# Patient Record
Sex: Female | Born: 1935 | Race: White | Hispanic: No | Marital: Single | Smoking: Never smoker
Health system: Southern US, Community
[De-identification: ages and names within clinical notes are randomized; demographics above are authoritative.]

## PROBLEM LIST (undated history)

## (undated) DIAGNOSIS — K648 Other hemorrhoids: Secondary | ICD-10-CM

## (undated) DIAGNOSIS — T7840XA Allergy, unspecified, initial encounter: Secondary | ICD-10-CM

## (undated) DIAGNOSIS — M8080XA Other osteoporosis with current pathological fracture, unspecified site, initial encounter for fracture: Secondary | ICD-10-CM

## (undated) DIAGNOSIS — S72002A Fracture of unspecified part of neck of left femur, initial encounter for closed fracture: Secondary | ICD-10-CM

## (undated) DIAGNOSIS — K5790 Diverticulosis of intestine, part unspecified, without perforation or abscess without bleeding: Secondary | ICD-10-CM

## (undated) DIAGNOSIS — Z96659 Presence of unspecified artificial knee joint: Secondary | ICD-10-CM

## (undated) DIAGNOSIS — D649 Anemia, unspecified: Secondary | ICD-10-CM

## (undated) DIAGNOSIS — B351 Tinea unguium: Secondary | ICD-10-CM

## (undated) DIAGNOSIS — I10 Essential (primary) hypertension: Secondary | ICD-10-CM

## (undated) DIAGNOSIS — R21 Rash and other nonspecific skin eruption: Secondary | ICD-10-CM

## (undated) HISTORY — DX: Other hemorrhoids: K64.8

## (undated) HISTORY — DX: Diverticulosis of intestine, part unspecified, without perforation or abscess without bleeding: K57.90

## (undated) HISTORY — DX: Other osteoporosis with current pathological fracture, unspecified site, initial encounter for fracture: M80.80XA

## (undated) HISTORY — PX: CATARACT EXTRACTION: SUR2

## (undated) HISTORY — DX: Rash and other nonspecific skin eruption: R21

## (undated) HISTORY — PX: REPLACEMENT TOTAL KNEE: SUR1224

## (undated) HISTORY — DX: Essential (primary) hypertension: I10

## (undated) HISTORY — DX: Fracture of unspecified part of neck of left femur, initial encounter for closed fracture: S72.002A

## (undated) HISTORY — DX: Tinea unguium: B35.1

## (undated) HISTORY — DX: Presence of unspecified artificial knee joint: Z96.659

## (undated) HISTORY — PX: TONSILLECTOMY: SHX5217

## (undated) HISTORY — DX: Anemia, unspecified: D64.9

## (undated) HISTORY — DX: Allergy, unspecified, initial encounter: T78.40XA

---

## 2001-05-08 ENCOUNTER — Encounter: Payer: Self-pay | Admitting: Internal Medicine

## 2002-03-10 ENCOUNTER — Other Ambulatory Visit: Admission: RE | Admit: 2002-03-10 | Discharge: 2002-03-10 | Payer: Self-pay | Admitting: Internal Medicine

## 2003-10-30 ENCOUNTER — Encounter: Payer: Self-pay | Admitting: Internal Medicine

## 2004-03-08 ENCOUNTER — Ambulatory Visit: Payer: Self-pay | Admitting: Internal Medicine

## 2004-03-17 ENCOUNTER — Ambulatory Visit: Payer: Self-pay | Admitting: Internal Medicine

## 2004-06-08 ENCOUNTER — Ambulatory Visit: Payer: Self-pay | Admitting: Internal Medicine

## 2004-11-17 ENCOUNTER — Ambulatory Visit: Payer: Self-pay | Admitting: Internal Medicine

## 2004-12-06 ENCOUNTER — Ambulatory Visit: Payer: Self-pay | Admitting: Internal Medicine

## 2005-03-13 ENCOUNTER — Ambulatory Visit: Payer: Self-pay | Admitting: Internal Medicine

## 2005-03-31 ENCOUNTER — Ambulatory Visit: Payer: Self-pay | Admitting: Internal Medicine

## 2005-09-11 ENCOUNTER — Ambulatory Visit: Payer: Self-pay | Admitting: Internal Medicine

## 2005-10-17 ENCOUNTER — Ambulatory Visit: Payer: Self-pay | Admitting: Internal Medicine

## 2005-12-18 ENCOUNTER — Ambulatory Visit: Payer: Self-pay | Admitting: Internal Medicine

## 2006-02-22 ENCOUNTER — Ambulatory Visit: Payer: Self-pay | Admitting: Internal Medicine

## 2006-02-27 ENCOUNTER — Ambulatory Visit: Payer: Self-pay | Admitting: Internal Medicine

## 2006-03-15 ENCOUNTER — Ambulatory Visit: Payer: Self-pay | Admitting: Internal Medicine

## 2006-05-15 ENCOUNTER — Ambulatory Visit: Payer: Self-pay | Admitting: Internal Medicine

## 2006-06-15 ENCOUNTER — Ambulatory Visit: Payer: Self-pay | Admitting: Internal Medicine

## 2006-06-15 LAB — CONVERTED CEMR LAB
CO2: 29 meq/L (ref 19–32)
GFR calc Af Amer: 89 mL/min
GFR calc non Af Amer: 74 mL/min
Glucose, Bld: 90 mg/dL (ref 70–99)
HDL: 58.5 mg/dL (ref >39.0)
Potassium: 4.7 meq/L (ref 3.5–5.1)
Sodium: 145 meq/L (ref 135–145)
Total CHOL/HDL Ratio: 3.5
Triglycerides: 110 mg/dL (ref 0–149)

## 2006-08-22 ENCOUNTER — Ambulatory Visit: Payer: Self-pay | Admitting: Internal Medicine

## 2006-09-03 ENCOUNTER — Inpatient Hospital Stay (HOSPITAL_COMMUNITY): Admission: RE | Admit: 2006-09-03 | Discharge: 2006-09-07 | Payer: Self-pay | Admitting: Orthopedic Surgery

## 2006-10-09 ENCOUNTER — Ambulatory Visit: Payer: Self-pay | Admitting: Internal Medicine

## 2006-11-07 ENCOUNTER — Ambulatory Visit: Payer: Self-pay | Admitting: Internal Medicine

## 2006-11-07 LAB — CONVERTED CEMR LAB
Basophils Absolute: 0.1 10*3/uL (ref 0.0–0.1)
Basophils Relative: 1.1 % — ABNORMAL HIGH (ref 0.0–1.0)
Eosinophils Absolute: 0.1 10*3/uL (ref 0.0–0.6)
Eosinophils Relative: 2 % (ref 0.0–5.0)
HCT: 33.1 % — ABNORMAL LOW (ref 36.0–46.0)
Hemoglobin: 11.2 g/dL — ABNORMAL LOW (ref 12.0–15.0)
Iron: 75 ug/dL (ref 42–145)
Lymphocytes Relative: 20.6 % (ref 12.0–46.0)
MCHC: 34 g/dL (ref 30.0–36.0)
MCV: 92.9 fL (ref 78.0–100.0)
Monocytes Absolute: 0.5 10*3/uL (ref 0.2–0.7)
Monocytes Relative: 11.3 % — ABNORMAL HIGH (ref 3.0–11.0)
Neutro Abs: 3.1 10*3/uL (ref 1.4–7.7)
Neutrophils Relative %: 65 % (ref 43.0–77.0)
Platelets: 204 10*3/uL (ref 150–400)
RBC: 3.56 M/uL — ABNORMAL LOW (ref 3.87–5.11)
RDW: 13.5 % (ref 11.5–14.6)
Saturation Ratios: 24 % (ref 20.0–50.0)
Transferrin: 223.1 mg/dL (ref >=212.0)
WBC: 4.8 10*3/uL (ref 4.5–10.5)

## 2006-12-17 ENCOUNTER — Encounter: Payer: Self-pay | Admitting: Internal Medicine

## 2006-12-17 DIAGNOSIS — I10 Essential (primary) hypertension: Secondary | ICD-10-CM | POA: Insufficient documentation

## 2006-12-17 DIAGNOSIS — B372 Candidiasis of skin and nail: Secondary | ICD-10-CM

## 2006-12-17 DIAGNOSIS — J309 Allergic rhinitis, unspecified: Secondary | ICD-10-CM

## 2006-12-17 DIAGNOSIS — M810 Age-related osteoporosis without current pathological fracture: Secondary | ICD-10-CM | POA: Insufficient documentation

## 2006-12-17 DIAGNOSIS — D509 Iron deficiency anemia, unspecified: Secondary | ICD-10-CM

## 2006-12-17 HISTORY — DX: Essential (primary) hypertension: I10

## 2006-12-19 ENCOUNTER — Telehealth (INDEPENDENT_AMBULATORY_CARE_PROVIDER_SITE_OTHER): Payer: Self-pay | Admitting: *Deleted

## 2007-01-11 ENCOUNTER — Ambulatory Visit: Payer: Self-pay | Admitting: Internal Medicine

## 2007-01-11 LAB — CONVERTED CEMR LAB
Basophils Absolute: 0.1 10*3/uL (ref 0.0–0.1)
Hemoglobin: 12.3 g/dL (ref 12.0–15.0)
Iron: 57 ug/dL (ref 42–145)
Lymphocytes Relative: 22.9 % (ref 12.0–46.0)
MCHC: 34.1 g/dL (ref 30.0–36.0)
Monocytes Absolute: 0.8 10*3/uL — ABNORMAL HIGH (ref 0.2–0.7)
Monocytes Relative: 13.6 % — ABNORMAL HIGH (ref 3.0–11.0)
Neutro Abs: 3.7 10*3/uL (ref 1.4–7.7)

## 2007-01-24 ENCOUNTER — Telehealth: Payer: Self-pay | Admitting: Internal Medicine

## 2007-01-24 ENCOUNTER — Ambulatory Visit: Payer: Self-pay | Admitting: Internal Medicine

## 2007-01-24 DIAGNOSIS — Z888 Allergy status to other drugs, medicaments and biological substances status: Secondary | ICD-10-CM | POA: Insufficient documentation

## 2007-02-13 ENCOUNTER — Telehealth (INDEPENDENT_AMBULATORY_CARE_PROVIDER_SITE_OTHER): Payer: Self-pay | Admitting: *Deleted

## 2007-03-08 ENCOUNTER — Ambulatory Visit: Payer: Self-pay | Admitting: Internal Medicine

## 2007-03-22 ENCOUNTER — Ambulatory Visit: Payer: Self-pay | Admitting: Internal Medicine

## 2007-06-10 ENCOUNTER — Ambulatory Visit: Payer: Self-pay | Admitting: Internal Medicine

## 2007-06-10 DIAGNOSIS — Z96659 Presence of unspecified artificial knee joint: Secondary | ICD-10-CM | POA: Insufficient documentation

## 2007-06-10 HISTORY — DX: Presence of unspecified artificial knee joint: Z96.659

## 2007-06-10 LAB — CONVERTED CEMR LAB
Basophils Absolute: 0 10*3/uL (ref 0.0–0.1)
Calcium: 9.6 mg/dL (ref 8.4–10.5)
Chloride: 103 meq/L (ref 96–112)
Eosinophils Absolute: 0.1 10*3/uL (ref 0.0–0.6)
GFR calc non Af Amer: 73 mL/min
MCHC: 32.7 g/dL (ref 30.0–36.0)
MCV: 99.4 fL (ref 78.0–100.0)
Platelets: 206 10*3/uL (ref 150–400)
RBC: 3.8 M/uL — ABNORMAL LOW (ref 3.87–5.11)
Sodium: 141 meq/L (ref 135–145)

## 2007-09-09 ENCOUNTER — Ambulatory Visit: Payer: Self-pay | Admitting: Internal Medicine

## 2007-09-09 LAB — CONVERTED CEMR LAB
CO2: 31 meq/L (ref 19–32)
Chloride: 103 meq/L (ref 96–112)
Creatinine, Ser: 0.8 mg/dL (ref 0.4–1.2)
Eosinophils Relative: 1.9 % (ref 0.0–5.0)
Folate: >20 ng/mL
HCT: 35.7 % — ABNORMAL LOW (ref 36.0–46.0)
Hemoglobin: 12.3 g/dL (ref 12.0–15.0)
Iron: 95 ug/dL (ref 42–145)
Lymphocytes Relative: 18.3 % (ref 12.0–46.0)
Monocytes Absolute: 0.5 10*3/uL (ref 0.1–1.0)
Monocytes Relative: 10.7 % (ref 3.0–12.0)
Platelets: 192 10*3/uL (ref 150–400)
Potassium: 4.3 meq/L (ref 3.5–5.1)
WBC: 5 10*3/uL (ref 4.5–10.5)

## 2007-10-24 ENCOUNTER — Telehealth (INDEPENDENT_AMBULATORY_CARE_PROVIDER_SITE_OTHER): Payer: Self-pay | Admitting: *Deleted

## 2007-12-06 ENCOUNTER — Telehealth: Payer: Self-pay | Admitting: Internal Medicine

## 2008-01-07 ENCOUNTER — Ambulatory Visit: Payer: Self-pay | Admitting: Internal Medicine

## 2008-01-27 ENCOUNTER — Encounter: Payer: Self-pay | Admitting: Internal Medicine

## 2008-02-07 ENCOUNTER — Ambulatory Visit: Payer: Self-pay | Admitting: Internal Medicine

## 2008-05-11 ENCOUNTER — Ambulatory Visit: Payer: Self-pay | Admitting: Internal Medicine

## 2008-05-11 LAB — CONVERTED CEMR LAB
ALT: 18 units/L (ref 0–35)
AST: 23 units/L (ref 0–37)
Albumin: 3.8 g/dL (ref 3.5–5.2)
Cholesterol: 198 mg/dL (ref 0–200)
HDL: 52.2 mg/dL (ref >39.0)
Total Bilirubin: 0.6 mg/dL (ref 0.3–1.2)
Total Protein: 6.5 g/dL (ref 6.0–8.3)
Triglycerides: 130 mg/dL (ref 0–149)
VLDL: 26 mg/dL (ref 0–40)

## 2008-05-19 ENCOUNTER — Ambulatory Visit: Payer: Self-pay | Admitting: Internal Medicine

## 2008-05-19 DIAGNOSIS — L272 Dermatitis due to ingested food: Secondary | ICD-10-CM

## 2008-05-19 LAB — CONVERTED CEMR LAB
Cholesterol, target level: 200 mg/dL
LDL Goal: 130 mg/dL

## 2008-05-22 ENCOUNTER — Ambulatory Visit: Payer: Self-pay | Admitting: Internal Medicine

## 2008-08-19 ENCOUNTER — Ambulatory Visit: Payer: Self-pay | Admitting: Internal Medicine

## 2008-08-19 LAB — CONVERTED CEMR LAB
BUN: 17 mg/dL (ref 6–23)
Basophils Absolute: 0.1 10*3/uL (ref 0.0–0.1)
Creatinine, Ser: 0.7 mg/dL (ref 0.4–1.2)
Eosinophils Relative: 3.1 % (ref 0.0–5.0)
GFR calc non Af Amer: 85.25 mL/min (ref >60)
Glucose, Bld: 94 mg/dL (ref 70–99)
Iron: 64 ug/dL (ref 42–145)
Monocytes Absolute: 0.6 10*3/uL (ref 0.1–1.0)
Monocytes Relative: 11.4 % (ref 3.0–12.0)
Neutrophils Relative %: 62.5 % (ref 43.0–77.0)
Platelets: 168 10*3/uL (ref 150.0–400.0)
RDW: 11.5 % (ref 11.5–14.6)
Transferrin: 212.1 mg/dL (ref 212.0–360.0)
Vit D, 25-Hydroxy: 36 ng/mL (ref 30–89)
WBC: 5.4 10*3/uL (ref 4.5–10.5)

## 2008-08-20 ENCOUNTER — Telehealth: Payer: Self-pay | Admitting: *Deleted

## 2008-11-20 ENCOUNTER — Ambulatory Visit: Payer: Self-pay | Admitting: Internal Medicine

## 2009-01-20 LAB — HM MAMMOGRAPHY

## 2009-01-27 ENCOUNTER — Encounter: Payer: Self-pay | Admitting: Internal Medicine

## 2009-02-02 ENCOUNTER — Ambulatory Visit: Payer: Self-pay | Admitting: Internal Medicine

## 2009-05-17 ENCOUNTER — Ambulatory Visit: Payer: Self-pay | Admitting: Internal Medicine

## 2009-05-17 LAB — CONVERTED CEMR LAB
Albumin: 3.5 g/dL (ref 3.5–5.2)
Alkaline Phosphatase: 49 units/L (ref 39–117)
Basophils Absolute: 0 10*3/uL (ref 0.0–0.1)
CO2: 29 meq/L (ref 19–32)
Calcium: 9.1 mg/dL (ref 8.4–10.5)
Creatinine, Ser: 0.8 mg/dL (ref 0.4–1.2)
Eosinophils Absolute: 0.1 10*3/uL (ref 0.0–0.7)
GFR calc non Af Amer: 72.94 mL/min (ref >60)
Glucose, Bld: 78 mg/dL (ref 70–99)
HCT: 35.6 % — ABNORMAL LOW (ref 36.0–46.0)
HDL: 46.7 mg/dL (ref >39.00)
Hemoglobin: 11.7 g/dL — ABNORMAL LOW (ref 12.0–15.0)
Lymphs Abs: 0.9 10*3/uL (ref 0.7–4.0)
MCHC: 32.8 g/dL (ref 30.0–36.0)
MCV: 100.2 fL — ABNORMAL HIGH (ref 78.0–100.0)
Monocytes Absolute: 0.6 10*3/uL (ref 0.1–1.0)
Neutro Abs: 3.7 10*3/uL (ref 1.4–7.7)
Platelets: 180 10*3/uL (ref 150.0–400.0)
RDW: 11.9 % (ref 11.5–14.6)
Sodium: 142 meq/L (ref 135–145)
TSH: 1.11 microintl units/mL (ref 0.35–5.50)
Total Protein: 6.5 g/dL (ref 6.0–8.3)
Triglycerides: 144 mg/dL (ref 0.0–149.0)
VLDL: 28.8 mg/dL (ref 0.0–40.0)

## 2009-05-24 ENCOUNTER — Ambulatory Visit: Payer: Self-pay | Admitting: Internal Medicine

## 2009-11-05 ENCOUNTER — Encounter: Payer: Self-pay | Admitting: Internal Medicine

## 2009-11-05 ENCOUNTER — Ambulatory Visit: Payer: Self-pay | Admitting: Internal Medicine

## 2009-11-15 ENCOUNTER — Ambulatory Visit: Payer: Self-pay | Admitting: Internal Medicine

## 2009-11-15 LAB — CONVERTED CEMR LAB
ALT: 18 units/L (ref 0–35)
AST: 21 units/L (ref 0–37)
Albumin: 4 g/dL (ref 3.5–5.2)
Alkaline Phosphatase: 57 units/L (ref 39–117)
BUN: 14 mg/dL (ref 6–23)
Basophils Absolute: 0 10*3/uL (ref 0.0–0.1)
CO2: 30 meq/L (ref 19–32)
Chloride: 106 meq/L (ref 96–112)
Cholesterol: 196 mg/dL (ref 0–200)
GFR calc non Af Amer: 80.97 mL/min (ref >60)
Glucose, Bld: 84 mg/dL (ref 70–99)
HCT: 34.4 % — ABNORMAL LOW (ref 36.0–46.0)
Lymphs Abs: 1 10*3/uL (ref 0.7–4.0)
MCV: 98 fL (ref 78.0–100.0)
Monocytes Absolute: 0.5 10*3/uL (ref 0.1–1.0)
Monocytes Relative: 10.4 % (ref 3.0–12.0)
Neutrophils Relative %: 67.2 % (ref 43.0–77.0)
Platelets: 170 10*3/uL (ref 150.0–400.0)
Potassium: 3.8 meq/L (ref 3.5–5.1)
RDW: 12.9 % (ref 11.5–14.6)
Sodium: 142 meq/L (ref 135–145)
VLDL: 24.2 mg/dL (ref 0.0–40.0)

## 2009-11-22 ENCOUNTER — Ambulatory Visit: Payer: Self-pay | Admitting: Internal Medicine

## 2009-12-17 ENCOUNTER — Telehealth: Payer: Self-pay | Admitting: Internal Medicine

## 2010-01-24 ENCOUNTER — Telehealth: Payer: Self-pay | Admitting: Internal Medicine

## 2010-01-27 ENCOUNTER — Ambulatory Visit: Payer: Self-pay | Admitting: Internal Medicine

## 2010-01-31 ENCOUNTER — Encounter: Payer: Self-pay | Admitting: Internal Medicine

## 2010-02-02 ENCOUNTER — Encounter: Payer: Self-pay | Admitting: Internal Medicine

## 2010-03-14 ENCOUNTER — Telehealth: Payer: Self-pay | Admitting: Internal Medicine

## 2010-03-21 ENCOUNTER — Ambulatory Visit: Payer: Self-pay | Admitting: Internal Medicine

## 2010-03-21 DIAGNOSIS — R928 Other abnormal and inconclusive findings on diagnostic imaging of breast: Secondary | ICD-10-CM | POA: Insufficient documentation

## 2010-03-21 LAB — CONVERTED CEMR LAB
Basophils Absolute: 0 10*3/uL (ref 0.0–0.1)
Calcium, Total (PTH): 9.7 mg/dL (ref 8.4–10.5)
Calcium: 9.6 mg/dL (ref 8.4–10.5)
GFR calc non Af Amer: 72.79 mL/min (ref >60)
Lymphocytes Relative: 15.9 % (ref 12.0–46.0)
Monocytes Relative: 8.1 % (ref 3.0–12.0)
Neutrophils Relative %: 74.2 % (ref 43.0–77.0)
PTH: 19.4 pg/mL (ref 14.0–72.0)
Platelets: 166 10*3/uL (ref 150.0–400.0)
Potassium: 3.9 meq/L (ref 3.5–5.1)
RDW: 13.2 % (ref 11.5–14.6)
Saturation Ratios: 28.8 % (ref 20.0–50.0)
Sodium: 139 meq/L (ref 135–145)
Transferrin: 237.8 mg/dL (ref 212.0–360.0)

## 2010-03-28 ENCOUNTER — Telehealth: Payer: Self-pay | Admitting: Internal Medicine

## 2010-04-05 ENCOUNTER — Encounter: Payer: Self-pay | Admitting: Internal Medicine

## 2010-04-05 ENCOUNTER — Ambulatory Visit (HOSPITAL_COMMUNITY)
Admission: RE | Admit: 2010-04-05 | Discharge: 2010-04-05 | Payer: Self-pay | Source: Home / Self Care | Admitting: Internal Medicine

## 2010-06-05 LAB — CONVERTED CEMR LAB
Basophils Absolute: 0 10*3/uL (ref 0.0–0.1)
CO2: 29 meq/L (ref 19–32)
Chloride: 107 meq/L (ref 96–112)
Eosinophils Absolute: 0.1 10*3/uL (ref 0.0–0.6)
MCHC: 34.3 g/dL (ref 30.0–36.0)
MCV: 98.3 fL (ref 78.0–100.0)
Monocytes Relative: 10.5 % (ref 3.0–11.0)
Neutrophils Relative %: 72.2 % (ref 43.0–77.0)
Platelets: 191 10*3/uL (ref 150–400)
RBC: 3.63 M/uL — ABNORMAL LOW (ref 3.87–5.11)
Sodium: 143 meq/L (ref 135–145)

## 2010-06-09 NOTE — Progress Notes (Signed)
Summary: REQUEST FOR RETURN CALL  Phone Note Call from Patient   Caller: Patient  [phone] Complaint: Breathing Problems Summary of Call: Pt called to ck on status of medication that she was supposed to be receiving in ref to the results of her recent bone density scan?Marland Kitchen... Pt can be reached at  [phone] for information..... [facility] [address]  Initial call taken by: Debbra Riding,  January 24, 2010 9:50 AM  Follow-up for Phone Call        left message on machine we are trying to get prlia approved-will call pt when done Follow-up by: Willy Eddy, LPN,  January 24, 2010 5:22 PM  Additional Follow-up for Phone Call Additional follow up Details #1::        prolia was denied. Additional Follow-up by: Warnell Forester,  February 01, 2010 10:04 AM

## 2010-06-09 NOTE — Assessment & Plan Note (Signed)
Summary: 6 month rov/nrj   Vital Signs:  Patient profile:   77 year old female Height:      65 inches Weight:      143 pounds BMI:     23.88 Temp:     98.2 degrees F oral Pulse rate:   72 / minute Resp:     14 per minute BP sitting:   140 / 80  (left arm)  Vitals Entered By: Willy Eddy, LPN (November 22, 2009 9:20 AM) CC: roa labs, Hypertension Management   Primary Care Provider:  Terrilee Files  CC:  roa labs and Hypertension Management.  History of Present Illness: follow up lipids and loiver as well as monitering of medications and labs results recent change in iron to every day   Hypertension History:      She denies headache, chest pain, palpitations, dyspnea with exertion, orthopnea, PND, peripheral edema, visual symptoms, neurologic problems, syncope, and side effects from treatment.        Positive major cardiovascular risk factors include female age 17 years old or older and hypertension.  Negative major cardiovascular risk factors include no history of diabetes or hyperlipidemia, negative family history for ischemic heart disease, and non-tobacco-user status.        Further assessment for target organ damage reveals no history of ASHD, cardiac end-organ damage (CHF/LVH), stroke/TIA, peripheral vascular disease, renal insufficiency, or hypertensive retinopathy.     Preventive Screening-Counseling & Management  Alcohol-Tobacco     Smoking Status: never     Passive Smoke Exposure: yes  Problems Prior to Update: 1)  Preventive Health Care  (ICD-V70.0) 2)  Dermatitis Due To Food Taken Internally  (ICD-693.1) 3)  Total Knee Replacement, Right, Hx of  (ICD-V43.65) 4)  Advef, Drug/med/biol Subst, Drug Allergy Nec  (ICD-995.27) 5)  Candidiasis, Skin/nails  (ICD-112.3) 6)  Osteoporosis  (ICD-733.00) 7)  Hypertension  (ICD-401.9) 8)  Anemia-iron Deficiency  (ICD-280.9) 9)  Allergic Rhinitis  (ICD-477.9)  Current Problems (verified): 1)  Preventive Health Care   (ICD-V70.0) 2)  Dermatitis Due To Food Taken Internally  (ICD-693.1) 3)  Total Knee Replacement, Right, Hx of  (ICD-V43.65) 4)  Advef, Drug/med/biol Subst, Drug Allergy Nec  (ICD-995.27) 5)  Candidiasis, Skin/nails  (ICD-112.3) 6)  Osteoporosis  (ICD-733.00) 7)  Hypertension  (ICD-401.9) 8)  Anemia-iron Deficiency  (ICD-280.9) 9)  Allergic Rhinitis  (ICD-477.9)  Medications Prior to Update: 1)  Multivitamins   Caps (Multiple Vitamin) .... Once Daily 2)  Vitamin E Natural 400 Unit  Caps (Vitamin E) .... Two Times A Day 3)  Vitamin B-6 50 Mg  Tabs (Pyridoxine Hcl) .... Once Daily 4)  Cvs Folic Acid 400 Mcg  Tabs (Folic Acid) .... Once Daily 5)  Ginkoba 40 Mg  Tabs (Ginkgo Biloba) .... 2 Once Daily 6)  Claritin 10 Mg  Caps (Loratadine) .... One By Mouth Daily  ( Generic) 7)  Tylenol Arthritis Pain 650 Mg  Tbcr (Acetaminophen) .... Take 1 Tablet By Mouth Three Times A Day 8)  Os-Cal 500 + D 500-400 Mg-Unit  Tabs (Calcium Carbonate-Vitamin D) .Marland Kitchen.. 1  Two Times A Day 9)  Ferrous Sulfate 325 (65 Fe) Mg  Tbec (Ferrous Sulfate) .Marland Kitchen.. 1 Every Other Day 10)  Zinc Gluconate 50 Mg  Tabs (Zinc Gluconate) .... Take 1 Tablet By Mouth Once A Day 11)  Nasonex 50 Mcg/act  Susp (Mometasone Furoate) .... Two Times A Day 12)  Ginger Root 500 Mg  Caps (Ginger (Zingiber Officinalis)) .... Once Daily 13)  Norvasc 5 Mg  Tabs (Amlodipine Besylate) .... One By Mouth Daily 14)  Vitamin B-12 250 Mcg  Tabs (Cyanocobalamin) .... Once Daily 15)  Amoxil 500 Mg  Caps (Amoxicillin) .... 4 By Mouth 1 Hour Prior To Procedure 16)  Flaxseed Oil 1000 Mg  Caps (Flaxseed (Linseed)) .... One By Mouth Bid 17)  Betamethasone Dipropionate 0.05 % Crea (Betamethasone Dipropionate) .... Apply As Directed 18)  Osteo Bi-Flex Joint Shield  Tabs (Misc Natural Products) .Marland Kitchen.. 1 Once Daily 19)  Benadryl 25 Mg Tabs (Diphenhydramine Hcl) .Marland Kitchen.. 1-2 Once Daily  Current Medications (verified): 1)  Multivitamins   Caps (Multiple Vitamin) .... Once  Daily 2)  Vitamin E Natural 400 Unit  Caps (Vitamin E) .... Two Times A Day 3)  Vitamin B-6 50 Mg  Tabs (Pyridoxine Hcl) .... Once Daily 4)  Cvs Folic Acid 400 Mcg  Tabs (Folic Acid) .... Once Daily 5)  Ginkoba 40 Mg  Tabs (Ginkgo Biloba) .... 2 Once Daily 6)  Claritin 10 Mg  Caps (Loratadine) .... One By Mouth Daily  ( Generic) 7)  Tylenol Arthritis Pain 650 Mg  Tbcr (Acetaminophen) .... Take 1 Tablet By Mouth Three Times A Day 8)  Os-Cal 500 + D 500-400 Mg-Unit  Tabs (Calcium Carbonate-Vitamin D) .Marland Kitchen.. 1  Two Times A Day 9)  Ferrous Sulfate 325 (65 Fe) Mg  Tbec (Ferrous Sulfate) .Marland Kitchen.. 1 Every  Day 10)  Zinc Gluconate 50 Mg  Tabs (Zinc Gluconate) .... Take 1 Tablet By Mouth Once A Day 11)  Nasonex 50 Mcg/act  Susp (Mometasone Furoate) .... Two Times A Day 12)  Ginger Root 500 Mg  Caps (Ginger (Zingiber Officinalis)) .... Once Daily 13)  Norvasc 5 Mg  Tabs (Amlodipine Besylate) .... One By Mouth Daily 14)  Vitamin B-12 250 Mcg  Tabs (Cyanocobalamin) .... Once Daily 15)  Amoxil 500 Mg  Caps (Amoxicillin) .... 4 By Mouth 1 Hour Prior To Procedure 16)  Flaxseed Oil 1000 Mg  Caps (Flaxseed (Linseed)) .... One By Mouth Bid 17)  Betamethasone Dipropionate 0.05 % Crea (Betamethasone Dipropionate) .... Apply As Directed 18)  Osteo Bi-Flex Joint Shield  Tabs (Misc Natural Products) .Marland Kitchen.. 1 Once Daily 19)  Benadryl 25 Mg Tabs (Diphenhydramine Hcl) .Marland Kitchen.. 1-2 Once Daily  Allergies (verified): 1)  ! Altace 2)  ! Pneumovax 23 (Pneumococcal Vac Polyvalent)  Past History:  Social History: Last updated: 03/08/2007 Occupation: Retired Single Never Smoked  Risk Factors: Smoking Status: never (11/22/2009) Passive Smoke Exposure: yes (11/22/2009)  Past medical, surgical, family and social histories (including risk factors) reviewed, and no changes noted (except as noted below).  Past Medical History: Reviewed history from 09/09/2007 and no changes required. Allergic rhinitis Anemia-iron  deficiency Hypertension Osteoporosis fungus nail 112.3 rashes  ( POSSIBLE DUE TO FISH ALLERGIES)  Past Surgical History: Reviewed history from 12/17/2006 and no changes required. Total knee replacement  R  Family History: Reviewed history and no changes required.  Social History: Reviewed history from 03/08/2007 and no changes required. Occupation: Retired Single Never Smoked  Review of Systems  The patient denies anorexia, fever, weight loss, weight gain, vision loss, decreased hearing, hoarseness, chest pain, syncope, dyspnea on exertion, peripheral edema, prolonged cough, headaches, hemoptysis, abdominal pain, melena, hematochezia, severe indigestion/heartburn, hematuria, incontinence, genital sores, muscle weakness, suspicious skin lesions, transient blindness, difficulty walking, depression, unusual weight change, abnormal bleeding, enlarged lymph nodes, angioedema, and breast masses.    Physical Exam  General:  Well-developed,well-nourished,in no acute distress; alert,appropriate and cooperative throughout examination Head:  Normocephalic and atraumatic without obvious abnormalities. No apparent alopecia or balding. Eyes:  pupils equal and pupils round.   Ears:  R ear normal and L ear normal.   Nose:  no external deformity and no nasal discharge.   Mouth:  good dentition and pharynx pink and moist.   Neck:  No deformities, masses, or tenderness noted. Lungs:  Normal respiratory effort, chest expands symmetrically. Lungs are clear to auscultation, no crackles or wheezes. Heart:  Normal rate and regular rhythm. S1 and S2 normal without gallop, murmur, click, rub or other extra sounds. Abdomen:  Bowel sounds positive,abdomen soft and non-tender without masses, organomegaly or hernias noted. Msk:  KNEE REPLACEMENT Pulses:  R and L carotid,radial,femoral,dorsalis pedis and posterior tibial pulses are full and equal bilaterally Extremities:  No clubbing, cyanosis, edema, or  deformity noted with normal full range of motion of all joints.   Neurologic:  alert & oriented X3 and cranial nerves II-XII intact.     Impression & Recommendations:  Problem # 1:  HYPERTENSION (ICD-401.9) Assessment Improved  Her updated medication list for this problem includes:    Norvasc 5 Mg Tabs (Amlodipine besylate) ..... One by mouth daily  BP today: 140/80 Prior BP: 130/74 (05/24/2009)  10 Yr Risk Heart Disease: 13 % Prior 10 Yr Risk Heart Disease: 9 % (05/19/2008)  Labs Reviewed: K+: 3.8 (11/15/2009) Creat: : 0.7 (11/15/2009)   Chol: 196 (11/15/2009)   HDL: 55.80 (11/15/2009)   LDL: 116 (11/15/2009)   TG: 121.0 (11/15/2009)  Problem # 2:  ANEMIA-IRON DEFICIENCY (ICD-280.9) Assessment: Unchanged  Her updated medication list for this problem includes:    Cvs Folic Acid 400 Mcg Tabs (Folic acid) ..... Once daily    Ferrous Sulfate 325 (65 Fe) Mg Tbec (Ferrous sulfate) .Marland Kitchen... 1 every  day    Vitamin B-12 250 Mcg Tabs (Cyanocobalamin) ..... Once daily  Hgb: 12.0 (11/15/2009)   Hct: 34.4 (11/15/2009)   Platelets: 170.0 (11/15/2009) RBC: 3.51 (11/15/2009)   RDW: 12.9 (11/15/2009)   WBC: 5.1 (11/15/2009) MCV: 98.0 (11/15/2009)   MCHC: 34.8 (11/15/2009) Iron: 64 (08/19/2008)   % Sat: 21.6 (08/19/2008) B12: 893 (09/09/2007)   Folate: > 20.0 ng/mL (09/09/2007)   TSH: 1.11 (05/17/2009)  Problem # 3:  TOTAL KNEE REPLACEMENT, RIGHT, HX OF (ICD-V43.65) stable  Problem # 4:  ADVEF, DRUG/MED/BIOL SUBST, DRUG ALLERGY NEC (ICD-995.27) monitered labs  Complete Medication List: 1)  Multivitamins Caps (Multiple vitamin) .... Once daily 2)  Vitamin E Natural 400 Unit Caps (Vitamin e) .... Two times a day 3)  Vitamin B-6 50 Mg Tabs (Pyridoxine hcl) .... Once daily 4)  Cvs Folic Acid 400 Mcg Tabs (Folic acid) .... Once daily 5)  Ginkoba 40 Mg Tabs (Ginkgo biloba) .... 2 once daily 6)  Claritin 10 Mg Caps (Loratadine) .... One by mouth daily  ( generic) 7)  Tylenol Arthritis Pain 650  Mg Tbcr (Acetaminophen) .... Take 1 tablet by mouth three times a day 8)  Os-cal 500 + D 500-400 Mg-unit Tabs (Calcium carbonate-vitamin d) .Marland Kitchen.. 1  two times a day 9)  Ferrous Sulfate 325 (65 Fe) Mg Tbec (Ferrous sulfate) .Marland Kitchen.. 1 every  day 10)  Zinc Gluconate 50 Mg Tabs (Zinc gluconate) .... Take 1 tablet by mouth once a day 11)  Nasonex 50 Mcg/act Susp (Mometasone furoate) .... Two times a day 12)  Ginger Root 500 Mg Caps (Ginger (zingiber officinalis)) .... Once daily 13)  Norvasc 5 Mg Tabs (Amlodipine besylate) .... One by mouth daily 14)  Vitamin B-12 250 Mcg Tabs (Cyanocobalamin) .... Once daily 15)  Amoxil 500 Mg Caps (Amoxicillin) .... 4 by mouth 1 hour prior to procedure 16)  Flaxseed Oil 1000 Mg Caps (Flaxseed (linseed)) .... One by mouth bid 17)  Betamethasone Dipropionate 0.05 % Crea (Betamethasone dipropionate) .... Apply as directed 18)  Osteo Bi-flex Joint Shield Tabs (Misc natural products) .Marland Kitchen.. 1 once daily 19)  Benadryl 25 Mg Tabs (Diphenhydramine hcl) .Marland Kitchen.. 1-2 once daily  Hypertension Assessment/Plan:      The patient's hypertensive risk group is category B: At least one risk factor (excluding diabetes) with no target organ damage.  Her calculated 10 year risk of coronary heart disease is 13 %.  Today's blood pressure is 140/80.  Her blood pressure goal is < 140/90.  Patient Instructions: 1)  Please schedule a follow-up appointment in 4 months.

## 2010-06-09 NOTE — Miscellaneous (Signed)
Summary: Bone Density  Clinical Lists Changes  Orders: Added new Test order of T-Lumbar Vertebral Assessment (77082) - Signed 

## 2010-06-09 NOTE — Letter (Signed)
Summary: Handout Printed  Printed Handout:  - *Patient Instructions 

## 2010-06-09 NOTE — Op Note (Signed)
Summary: [facility]  [facility]   Imported By: Maryln Gottron 04/08/2010 10:58:44  _____________________________________________________________________  External Attachment:    Type:   Image     Comment:   External Document

## 2010-06-09 NOTE — Progress Notes (Signed)
Summary: BLOODWORK RESULTS  Phone Note Call from Patient Call back at Home Phone [phone]   Caller: Patient Call For: Stacie Glaze MD Summary of Call: PT WOULD LIKE BLOOD WORK RESULTS Initial call taken by: Heron Sabins,  March 28, 2010 1:22 PM  Follow-up for Phone Call        pt informed and instructions given about [facility] Follow-up by: Willy Eddy, LPN,  March 28, 2010 1:27 PM

## 2010-06-09 NOTE — Op Note (Signed)
Summary: [facility]/[place]   [facility] Orders/   Imported By: Maryln Gottron 04/12/2010 15:49:30  _____________________________________________________________________  External Attachment:    Type:   Image     Comment:   External Document

## 2010-06-09 NOTE — Assessment & Plan Note (Signed)
Summary: flu shot//ccm  Nurse Visit   Allergies: 1)  ! Altace 2)  ! Pneumovax 23 (Pneumococcal Vac Polyvalent)  Orders Added: 1)  Flu Vaccine 78yrs + MEDICARE PATIENTS [Q2039] 2)  Administration Flu vaccine - MCR [G0008] Flu Vaccine Consent Questions     Do you have a history of severe allergic reactions to this vaccine? no    Any prior history of allergic reactions to egg and/or gelatin? no    Do you have a sensitivity to the preservative Thimersol? no    Do you have a past history of Guillan-Barre Syndrome? no    Do you currently have an acute febrile illness? no    Have you ever had a severe reaction to latex? no    Vaccine information given and explained to patient? yes    Are you currently pregnant? no    Lot Number:[id number]   Exp Date:11/05/2010   Site Given  Left Deltoid [email or web address]

## 2010-06-09 NOTE — Assessment & Plan Note (Signed)
Summary: CPX/MHF   Vital Signs:  Patient profile:   77 year old female Height:      65 inches Weight:      142 pounds BMI:     23.72 Temp:     98.2 degrees F oral Pulse rate:   72 / minute Resp:     14 per minute BP sitting:   130 / 74  (left arm)  Vitals Entered By: Willy Eddy, LPN (May 24, 2009 8:57 AM) CC: annual visit for disease management   Primary Care Provider:  Terrilee Files  CC:  annual visit for disease management.  History of Present Illness: The pt was asked about all immunizations, health maint. services that are appropriate to their age and was given guidance on diet exercize  and weight management over age 8 and not sexually active reveiwed all testing protocols  Preventive Screening-Counseling & Management  Alcohol-Tobacco     Smoking Status: never     Passive Smoke Exposure: yes  Allergies: 1)  ! Altace 2)  ! Pneumovax 23 (Pneumococcal Vac Polyvalent)  Past History:  Social History: Last updated: 03/08/2007 Occupation: Retired Single Never Smoked  Risk Factors: Smoking Status: never (05/24/2009) Passive Smoke Exposure: yes (05/24/2009)  Past medical, surgical, family and social histories (including risk factors) reviewed, and no changes noted (except as noted below).  Past Medical History: Reviewed history from 09/09/2007 and no changes required. Allergic rhinitis Anemia-iron deficiency Hypertension Osteoporosis fungus nail 112.3 rashes  ( POSSIBLE DUE TO FISH ALLERGIES)  Past Surgical History: Reviewed history from 12/17/2006 and no changes required. Total knee replacement  R  Family History: Reviewed history and no changes required.  Social History: Reviewed history from 03/08/2007 and no changes required. Occupation: Retired Single Never Smoked  Review of Systems  The patient denies anorexia, fever, weight loss, weight gain, vision loss, decreased hearing, hoarseness, chest pain, syncope, dyspnea on exertion,  peripheral edema, prolonged cough, headaches, hemoptysis, abdominal pain, melena, hematochezia, severe indigestion/heartburn, hematuria, incontinence, genital sores, muscle weakness, suspicious skin lesions, transient blindness, difficulty walking, depression, unusual weight change, abnormal bleeding, enlarged lymph nodes, angioedema, and breast masses.    Physical Exam  General:  Well-developed,well-nourished,in no acute distress; alert,appropriate and cooperative throughout examination Head:  Normocephalic and atraumatic without obvious abnormalities. No apparent alopecia or balding. Eyes:  pupils equal and pupils round.   Ears:  R ear normal and L ear normal.   Nose:  no external deformity and no nasal discharge.   Mouth:  good dentition and pharynx pink and moist.   Neck:  No deformities, masses, or tenderness noted. Lungs:  Normal respiratory effort, chest expands symmetrically. Lungs are clear to auscultation, no crackles or wheezes. Heart:  Normal rate and regular rhythm. S1 and S2 normal without gallop, murmur, click, rub or other extra sounds. Pulses:  R and L carotid,radial,femoral,dorsalis pedis and posterior tibial pulses are full and equal bilaterally Extremities:  No clubbing, cyanosis, edema, or deformity noted with normal full range of motion of all joints.   Neurologic:  alert & oriented X3 and cranial nerves II-XII intact.     Impression & Recommendations:  Problem # 1:  HYPERTENSION (ICD-401.9) stable Her updated medication list for this problem includes:    Norvasc 5 Mg Tabs (Amlodipine besylate) ..... One by mouth daily  BP today: 130/74 Prior BP: 120/70 (11/20/2008)  Prior 10 Yr Risk Heart Disease: 9 % (05/19/2008)  Labs Reviewed: K+: 3.9 (05/17/2009) Creat: : 0.8 (05/17/2009)   Chol: 166 (05/17/2009)  HDL: 46.70 (05/17/2009)   LDL: 91 (05/17/2009)   TG: 144.0 (05/17/2009)  Problem # 2:  ANEMIA-IRON DEFICIENCY (ICD-280.9) increased iron to daily Her updated  medication list for this problem includes:    Cvs Folic Acid 400 Mcg Tabs (Folic acid) ..... Once daily    Ferrous Sulfate 325 (65 Fe) Mg Tbec (Ferrous sulfate) .Marland Kitchen... 1 every other day    Vitamin B-12 250 Mcg Tabs (Cyanocobalamin) ..... Once daily  Problem # 3:  PREVENTIVE HEALTH CARE (ICD-V70.0)  Mammogram: normal (01/20/2009) Pap smear: historical (05/08/2001) Colonoscopy: historical (05/08/2000) Bone Density: abnormal (10/30/2003) Td Booster: historical (05/09/2003)   Flu Vax: Fluvax 3+ (02/02/2009)   Pneumovax: Pneumovax (Medicare) (08/19/2008) Chol: 166 (05/17/2009)   HDL: 46.70 (05/17/2009)   LDL: 91 (05/17/2009)   TG: 144.0 (05/17/2009) TSH: 1.11 (05/17/2009)   Next mammogram due:: 01/2010 (05/24/2009) Next Colonoscopy due:: 05/08/2010 (05/24/2009) Next Bone Density due:: 11/2005 (05/24/2009)  Discussed using sunscreen, use of alcohol, drug use, self breast exam, routine dental care, routine eye care, schedule for GYN exam, routine physical exam, seat belts, multiple vitamins, osteoporosis prevention, adequate calcium intake in diet, recommendations for immunizations, mammograms and Pap smears.  Discussed exercise and checking cholesterol.  Discussed gun safety, safe sex, and contraception.  Complete Medication List: 1)  Multivitamins Caps (Multiple vitamin) .... Once daily 2)  Vitamin E Natural 400 Unit Caps (Vitamin e) .... Two times a day 3)  Vitamin B-6 50 Mg Tabs (Pyridoxine hcl) .... Once daily 4)  Cvs Folic Acid 400 Mcg Tabs (Folic acid) .... Once daily 5)  Ginkoba 40 Mg Tabs (Ginkgo biloba) .... 2 once daily 6)  Claritin 10 Mg Caps (Loratadine) .... One by mouth daily  ( generic) 7)  Tylenol Arthritis Pain 650 Mg Tbcr (Acetaminophen) .... Take 1 tablet by mouth three times a day 8)  Os-cal 500 + D 500-400 Mg-unit Tabs (Calcium carbonate-vitamin d) .Marland Kitchen.. 1  two times a day 9)  Ferrous Sulfate 325 (65 Fe) Mg Tbec (Ferrous sulfate) .Marland Kitchen.. 1 every other day 10)  Zinc Gluconate 50  Mg Tabs (Zinc gluconate) .... Take 1 tablet by mouth once a day 11)  Nasonex 50 Mcg/act Susp (Mometasone furoate) .... Two times a day 12)  Ginger Root 500 Mg Caps (Ginger (zingiber officinalis)) .... Once daily 13)  Norvasc 5 Mg Tabs (Amlodipine besylate) .... One by mouth daily 14)  Vitamin B-12 250 Mcg Tabs (Cyanocobalamin) .... Once daily 15)  Amoxil 500 Mg Caps (Amoxicillin) .... 4 by mouth 1 hour prior to procedure 16)  Flaxseed Oil 1000 Mg Caps (Flaxseed (linseed)) .... One by mouth bid 17)  Betamethasone Dipropionate 0.05 % Crea (Betamethasone dipropionate) .... Apply as directed 18)  Osteo Bi-flex Joint Shield Tabs (Misc natural products) .Marland Kitchen.. 1 once daily 19)  Benadryl 25 Mg Tabs (Diphenhydramine hcl) .Marland Kitchen.. 1-2 once daily  Other Orders: T-Bone Densitometry [phone])   Preventive Care Screening  Bone Density:    Date:  10/30/2003    Next Due:  11/2005    Results:  abnormal std dev  Mammogram:    Date:  01/20/2009    Next Due:  01/2010    Results:  normal    Contraindications/Deferment of Procedures/Staging:    Test/Procedure: PAP Smear    Reason for deferment: not indicated      Prevention & Chronic Care Immunizations   Influenza vaccine: Fluvax 3+  (02/02/2009)   Influenza vaccine due: 01/06/2010    Tetanus booster: 05/09/2003: historical   Tetanus booster due: 05/08/2013  Pneumococcal vaccine: Pneumovax (Medicare)  (08/19/2008)    H. zoster vaccine: Not documented  Colorectal Screening   Hemoccult: Not documented    Colonoscopy: historical  (05/08/2000)   Colonoscopy due: 05/08/2010  Other Screening   Pap smear: historical  (05/08/2001)   Pap smear action/deferral: not indicated  (05/24/2009)    Mammogram: normal  (01/20/2009)   Mammogram due: 01/2010    DXA bone density scan: abnormal  (10/30/2003)   DXA bone density action/deferral: Ordered  (05/24/2009)   DXA scan due: 11/2005    Smoking status: never  (05/24/2009)  Lipids   Total  Cholesterol: 166  (05/17/2009)   LDL: 91  (05/17/2009)   LDL Direct: 108.2  (06/15/2006)   HDL: 46.70  (05/17/2009)   Triglycerides: 144.0  (05/17/2009)  Hypertension   Last Blood Pressure: 130 / 74  (05/24/2009)   Serum creatinine: 0.8  (05/17/2009)   Serum potassium 3.9  (05/17/2009)  Self-Management Support :   Personal Goals (by the next clinic visit) :      Personal blood pressure goal: 140/90  (05/24/2009)   Patient will work on the following items until the next clinic visit to reach self-care goals:     Medications and monitoring: take my medicines every day, check my blood pressure  (05/24/2009)     Eating: eat more vegetables  (05/24/2009)     Activity: take a 30 minute walk every day  (05/24/2009)    Hypertension self-management support: Not documented   Nursing Instructions: Schedule screening DXA bone density scan (see order)

## 2010-06-09 NOTE — Progress Notes (Signed)
  Phone Note Call from Patient   Caller: CALCIUM AND CREATININE AT OV 11-14-DRAW Summary of Call: CALCIUM AND CREATININE DRAW AT OV 11-14 Initial call taken by: Willy Eddy, LPN,  March 14, 2010 4:42 PM

## 2010-06-09 NOTE — Progress Notes (Signed)
Summary: Pt called req results from bone density  Phone Note Call from Patient Call back at Home Phone [phone]   Caller: Patient Reason for Call: Referral Summary of Call: Pt called req results from bone density.  Pt says that detailed message can be lft on vm if pt not available when called.  Initial call taken by: Lucy Antigua,  December 17, 2009 9:35 AM  Follow-up for Phone Call        looks like scoliosis and osteoporosis- what to tell pt- any meds? Follow-up by: Willy Eddy, LPN,  December 17, 2009 9:42 AM  Additional Follow-up for Phone Call Additional follow up Details #1::        has lost some bone density in the hip consider prolia injections every 6 months may precert Additional Follow-up by: Stacie Glaze MD,  December 17, 2009 4:53 PM    Additional Follow-up for Phone Call Additional follow up Details #2::    prolia was denied - will try will something else Follow-up by: Willy Eddy, LPN,  January 07, 2010 1:41 PM

## 2010-06-09 NOTE — Assessment & Plan Note (Signed)
Summary: 4 month rov/njr   Vital Signs:  Patient profile:   77 year old female Weight:      138 pounds Temp:     98.4 degrees F oral Pulse rate:   78 / minute Pulse rhythm:   regular Resp:     12 per minute BP sitting:   138 / 82  Vitals Entered By: Lynann Beaver CMA AAMA (March 21, 2010 9:52 AM) CC: follow-up visit, Hypertension Management Is Patient Diabetic? No Pain Assessment Patient in pain? no        Primary Care Provider:  Terrilee Files  CC:  follow-up visit and Hypertension Management.  History of Present Illness: discussion of the use of reclast for osteoporosis package insert reveiwed with benifints and risks the prolia was too costly ans did not have a full indicaton since she has not exerpience an osteoporotic fracturepressure good  Hypertension History:      She denies headache, chest pain, palpitations, dyspnea with exertion, orthopnea, PND, peripheral edema, visual symptoms, neurologic problems, syncope, and side effects from treatment.        Positive major cardiovascular risk factors include female age 86 years old or older and hypertension.  Negative major cardiovascular risk factors include no history of diabetes or hyperlipidemia, negative family history for ischemic heart disease, and non-tobacco-user status.        Further assessment for target organ damage reveals no history of ASHD, cardiac end-organ damage (CHF/LVH), stroke/TIA, peripheral vascular disease, renal insufficiency, or hypertensive retinopathy.     Preventive Screening-Counseling & Management  Alcohol-Tobacco     Smoking Status: never     Tobacco Counseling: not indicated; no tobacco use  Problems Prior to Update: 1)  Mammogram, Abnormal, Left  (ICD-793.80) 2)  Preventive Health Care  (ICD-V70.0) 3)  Dermatitis Due To Food Taken Internally  (ICD-693.1) 4)  Total Knee Replacement, Right, Hx of  (ICD-V43.65) 5)  Advef, Drug/med/biol Subst, Drug Allergy Nec  (ICD-995.27) 6)   Candidiasis, Skin/nails  (ICD-112.3) 7)  Osteoporosis  (ICD-733.00) 8)  Hypertension  (ICD-401.9) 9)  Anemia-iron Deficiency  (ICD-280.9) 10)  Allergic Rhinitis  (ICD-477.9)  Medications Prior to Update: 1)  Multivitamins   Caps (Multiple Vitamin) .... Once Daily 2)  Vitamin E Natural 400 Unit  Caps (Vitamin E) .... Two Times A Day 3)  Vitamin B-6 50 Mg  Tabs (Pyridoxine Hcl) .... Once Daily 4)  Cvs Folic Acid 400 Mcg  Tabs (Folic Acid) .... Once Daily 5)  Ginkoba 40 Mg  Tabs (Ginkgo Biloba) .... 2 Once Daily 6)  Claritin 10 Mg  Caps (Loratadine) .... One By Mouth Daily  ( Generic) 7)  Tylenol Arthritis Pain 650 Mg  Tbcr (Acetaminophen) .... Take 1 Tablet By Mouth Three Times A Day 8)  Os-Cal 500 + D 500-400 Mg-Unit  Tabs (Calcium Carbonate-Vitamin D) .Marland Kitchen.. 1  Two Times A Day 9)  Ferrous Sulfate 325 (65 Fe) Mg  Tbec (Ferrous Sulfate) .Marland Kitchen.. 1 Every  Day 10)  Zinc Gluconate 50 Mg  Tabs (Zinc Gluconate) .... Take 1 Tablet By Mouth Once A Day 11)  Nasonex 50 Mcg/act  Susp (Mometasone Furoate) .... Two Times A Day 12)  Ginger Root 500 Mg  Caps (Ginger (Zingiber Officinalis)) .... Once Daily 13)  Norvasc 5 Mg  Tabs (Amlodipine Besylate) .... One By Mouth Daily 14)  Vitamin B-12 250 Mcg  Tabs (Cyanocobalamin) .... Once Daily 15)  Amoxil 500 Mg  Caps (Amoxicillin) .... 4 By Mouth 1 Hour Prior To Procedure 16)  Flaxseed Oil 1000 Mg  Caps (Flaxseed (Linseed)) .... One By Mouth Bid 17)  Betamethasone Dipropionate 0.05 % Crea (Betamethasone Dipropionate) .... Apply As Directed 18)  Osteo Bi-Flex Joint Shield  Tabs (Misc Natural Products) .Marland Kitchen.. 1 Once Daily 19)  Benadryl 25 Mg Tabs (Diphenhydramine Hcl) .Marland Kitchen.. 1-2 Once Daily  Current Medications (verified): 1)  Multivitamins   Caps (Multiple Vitamin) .... Once Daily 2)  Vitamin E Natural 400 Unit  Caps (Vitamin E) .... Two Times A Day 3)  Vitamin B-6 50 Mg  Tabs (Pyridoxine Hcl) .... Once Daily 4)  Cvs Folic Acid 400 Mcg  Tabs (Folic Acid) .... Once  Daily 5)  Ginkoba 40 Mg  Tabs (Ginkgo Biloba) .... 2 Once Daily 6)  Claritin 10 Mg  Caps (Loratadine) .... One By Mouth Daily  ( Generic) 7)  Tylenol Arthritis Pain 650 Mg  Tbcr (Acetaminophen) .... Take 1 Tablet By Mouth Three Times A Day 8)  Os-Cal 500 + D 500-400 Mg-Unit  Tabs (Calcium Carbonate-Vitamin D) .Marland Kitchen.. 1  Two Times A Day 9)  Ferrous Sulfate 325 (65 Fe) Mg  Tbec (Ferrous Sulfate) .Marland Kitchen.. 1 Every  Day 10)  Zinc Gluconate 50 Mg  Tabs (Zinc Gluconate) .... Take 1 Tablet By Mouth Once A Day 11)  Nasonex 50 Mcg/act  Susp (Mometasone Furoate) .... Two Times A Day 12)  Ginger Root 500 Mg  Caps (Ginger (Zingiber Officinalis)) .... Once Daily 13)  Norvasc 5 Mg  Tabs (Amlodipine Besylate) .... One By Mouth Daily 14)  Vitamin B-12 250 Mcg  Tabs (Cyanocobalamin) .... Once Daily 15)  Amoxil 500 Mg  Caps (Amoxicillin) .... 4 By Mouth 1 Hour Prior To Procedure 16)  Flaxseed Oil 1000 Mg  Caps (Flaxseed (Linseed)) .... One By Mouth Bid 17)  Betamethasone Dipropionate 0.05 % Crea (Betamethasone Dipropionate) .... Apply As Directed 18)  Osteo Bi-Flex Joint Shield  Tabs (Misc Natural Products) .Marland Kitchen.. 1 Once Daily 19)  Benadryl 25 Mg Tabs (Diphenhydramine Hcl) .Marland Kitchen.. 1-Daily  Allergies (verified): 1)  ! Altace 2)  ! Pneumovax 23 (Pneumococcal Vac Polyvalent)  Past History:  Social History: Last updated: 03/08/2007 Occupation: Retired Single Never Smoked  Risk Factors: Smoking Status: never (03/21/2010) Passive Smoke Exposure: yes (11/22/2009)  Past medical, surgical, family and social histories (including risk factors) reviewed, and no changes noted (except as noted below).  Past Medical History: Reviewed history from 09/09/2007 and no changes required. Allergic rhinitis Anemia-iron deficiency Hypertension Osteoporosis fungus nail 112.3 rashes  ( POSSIBLE DUE TO FISH ALLERGIES)  Past Surgical History: Reviewed history from 12/17/2006 and no changes required. Total knee replacement   R  Family History: Reviewed history and no changes required.  Social History: Reviewed history from 03/08/2007 and no changes required. Occupation: Retired Single Never Smoked  Review of Systems  The patient denies anorexia, fever, weight loss, weight gain, vision loss, decreased hearing, hoarseness, chest pain, syncope, dyspnea on exertion, peripheral edema, prolonged cough, headaches, hemoptysis, abdominal pain, melena, hematochezia, severe indigestion/heartburn, hematuria, incontinence, genital sores, muscle weakness, suspicious skin lesions, transient blindness, difficulty walking, depression, unusual weight change, abnormal bleeding, enlarged lymph nodes, angioedema, and breast masses.    Physical Exam  General:  Well-developed,well-nourished,in no acute distress; alert,appropriate and cooperative throughout examination Head:  Normocephalic and atraumatic without obvious abnormalities. No apparent alopecia or balding. Eyes:  pupils equal and pupils round.   Ears:  R ear normal and L ear normal.   Nose:  no external deformity and no nasal discharge.   Mouth:  good dentition and pharynx pink and moist.   Neck:  No deformities, masses, or tenderness noted. Lungs:  Normal respiratory effort, chest expands symmetrically. Lungs are clear to auscultation, no crackles or wheezes. Heart:  Normal rate and regular rhythm. S1 and S2 normal without gallop, murmur, click, rub or other extra sounds. Abdomen:  Bowel sounds positive,abdomen soft and non-tender without masses, organomegaly or hernias noted. Msk:  KNEE REPLACEMENT Pulses:  R and L carotid,radial,femoral,dorsalis pedis and posterior tibial pulses are full and equal bilaterally Extremities:  No clubbing, cyanosis, edema, or deformity noted with normal full range of motion of all joints.   Neurologic:  alert & oriented X3 and cranial nerves II-XII intact.     Impression & Recommendations:  Problem # 1:  ANEMIA-IRON DEFICIENCY  (ICD-280.9)  Her updated medication list for this problem includes:    Cvs Folic Acid 400 Mcg Tabs (Folic acid) ..... Once daily    Ferrous Sulfate 325 (65 Fe) Mg Tbec (Ferrous sulfate) .Marland Kitchen... 1 every  day    Vitamin B-12 250 Mcg Tabs (Cyanocobalamin) ..... Once daily  Hgb: 12.0 (11/15/2009)   Hct: 34.4 (11/15/2009)   Platelets: 170.0 (11/15/2009) RBC: 3.51 (11/15/2009)   RDW: 12.9 (11/15/2009)   WBC: 5.1 (11/15/2009) MCV: 98.0 (11/15/2009)   MCHC: 34.8 (11/15/2009) Iron: 64 (08/19/2008)   % Sat: 21.6 (08/19/2008) B12: 893 (09/09/2007)   Folate: > 20.0 ng/mL (09/09/2007)   TSH: 1.11 (05/17/2009)  Orders: Venipuncture (04540) Specimen Handling (98119) TLB-IBC Pnl (Iron/FE;Transferrin) (83550-IBC) TLB-CBC Platelet - w/Differential (85025-CBCD)  Problem # 2:  OSTEOPOROSIS (ICD-733.00) discusson of reclast and will refer Orders: T-Parathyroid Hormone, Intact w/ Calcium (14782-95621) Specimen Handling (30865) Misc. Referral (Misc. Ref)  Problem # 3:  MAMMOGRAM, ABNORMAL, LEFT (ICD-793.80)  had breast center exam and repeat exam in [facility] for 6 month follow up  Orders: Specimen Handling (78469)  Problem # 4:  HYPERTENSION (ICD-401.9)  Her updated medication list for this problem includes:    Norvasc 5 Mg Tabs (Amlodipine besylate) ..... One by mouth daily  Orders: TLB-BMP (Basic Metabolic Panel-BMET) (80048-METABOL)  BP today: 138/82 Prior BP: 140/80 (11/22/2009)  10 Yr Risk Heart Disease: 9 % Prior 10 Yr Risk Heart Disease: 13 % (11/22/2009)  Labs Reviewed: K+: 3.8 (11/15/2009) Creat: : 0.7 (11/15/2009)   Chol: 196 (11/15/2009)   HDL: 55.80 (11/15/2009)   LDL: 116 (11/15/2009)   TG: 121.0 (11/15/2009)  Complete Medication List: 1)  Multivitamins Caps (Multiple vitamin) .... Once daily 2)  Vitamin E Natural 400 Unit Caps (Vitamin e) .... Two times a day 3)  Vitamin B-6 50 Mg Tabs (Pyridoxine hcl) .... Once daily 4)  Cvs Folic Acid 400 Mcg Tabs (Folic acid) .... Once  daily 5)  Ginkoba 40 Mg Tabs (Ginkgo biloba) .... 2 once daily 6)  Claritin 10 Mg Caps (Loratadine) .... One by mouth daily  ( generic) 7)  Tylenol Arthritis Pain 650 Mg Tbcr (Acetaminophen) .... Take 1 tablet by mouth three times a day 8)  Os-cal 500 + D 500-400 Mg-unit Tabs (Calcium carbonate-vitamin d) .Marland Kitchen.. 1  two times a day 9)  Ferrous Sulfate 325 (65 Fe) Mg Tbec (Ferrous sulfate) .Marland Kitchen.. 1 every  day 10)  Zinc Gluconate 50 Mg Tabs (Zinc gluconate) .... Take 1 tablet by mouth once a day 11)  Nasonex 50 Mcg/act Susp (Mometasone furoate) .... Two times a day 12)  Ginger Root 500 Mg Caps (Ginger (zingiber officinalis)) .... Once daily 13)  Norvasc 5 Mg Tabs (Amlodipine besylate) .... One by  mouth daily 14)  Vitamin B-12 250 Mcg Tabs (Cyanocobalamin) .... Once daily 15)  Amoxil 500 Mg Caps (Amoxicillin) .... 4 by mouth 1 hour prior to procedure 16)  Flaxseed Oil 1000 Mg Caps (Flaxseed (linseed)) .... One by mouth bid 17)  Betamethasone Dipropionate 0.05 % Crea (Betamethasone dipropionate) .... Apply as directed 18)  Osteo Bi-flex Joint Shield Tabs (Misc natural products) .Marland Kitchen.. 1 once daily 19)  Benadryl 25 Mg Tabs (Diphenhydramine hcl) .Marland Kitchen.. 1-daily  Hypertension Assessment/Plan:      The patient's hypertensive risk group is category B: At least one risk factor (excluding diabetes) with no target organ damage.  Her calculated 10 year risk of coronary heart disease is 9 %.  Today's blood pressure is 138/82.  Her blood pressure goal is < 140/90.  Patient Instructions: 1)  Please schedule a follow-up appointment in 4 months.   Orders Added: 1)  T-Parathyroid Hormone, Intact w/ Calcium [16109-60454] 2)  Venipuncture [09811] 3)  Est. Patient Level IV [91478] 4)  Specimen Handling [99000] 5)  Misc. Referral [Misc. Ref] 6)  TLB-BMP (Basic Metabolic Panel-BMET) [80048-METABOL] 7)  TLB-IBC Pnl (Iron/FE;Transferrin) [83550-IBC] 8)  TLB-CBC Platelet - w/Differential [85025-CBCD]    Prevention &  Chronic Care Immunizations   Influenza vaccine: Fluvax 3+  (01/27/2010)   Influenza vaccine due: 01/06/2010    Tetanus booster: 05/09/2003: historical   Tetanus booster due: 05/08/2013    Pneumococcal vaccine: Pneumovax (Medicare)  (08/19/2008)    H. zoster vaccine: Not documented  Colorectal Screening   Hemoccult: Not documented    Colonoscopy: historical  (05/08/2000)   Colonoscopy due: 05/08/2010  Other Screening   Pap smear: historical  (05/08/2001)   Pap smear action/deferral: not indicated  (05/24/2009)    Mammogram: normal  (01/20/2009)   Mammogram due: 01/2010    DXA bone density scan: abnormal  (10/30/2003)   DXA bone density action/deferral: Ordered  (05/24/2009)   DXA scan due: 11/2005    Smoking status: never  (03/21/2010)  Lipids   Total Cholesterol: 196  (11/15/2009)   LDL: 116  (11/15/2009)   LDL Direct: 108.2  (06/15/2006)   HDL: 55.80  (11/15/2009)   Triglycerides: 121.0  (11/15/2009)  Hypertension   Last Blood Pressure: 138 / 82  (03/21/2010)   Serum creatinine: 0.7  (11/15/2009)   Serum potassium 3.8  (11/15/2009)  Self-Management Support :   Personal Goals (by the next clinic visit) :      Personal blood pressure goal: 140/90  (05/24/2009)   Hypertension self-management support: Not documented

## 2010-07-20 ENCOUNTER — Encounter: Payer: Self-pay | Admitting: Internal Medicine

## 2010-07-22 ENCOUNTER — Encounter: Payer: Self-pay | Admitting: Internal Medicine

## 2010-07-22 ENCOUNTER — Ambulatory Visit (INDEPENDENT_AMBULATORY_CARE_PROVIDER_SITE_OTHER): Payer: MEDICARE | Admitting: Internal Medicine

## 2010-07-22 DIAGNOSIS — M810 Age-related osteoporosis without current pathological fracture: Secondary | ICD-10-CM

## 2010-07-22 DIAGNOSIS — I10 Essential (primary) hypertension: Secondary | ICD-10-CM

## 2010-07-22 DIAGNOSIS — D509 Iron deficiency anemia, unspecified: Secondary | ICD-10-CM

## 2010-07-22 LAB — CBC WITH DIFFERENTIAL/PLATELET
Basophils Absolute: 0 10*3/uL (ref 0.0–0.1)
Basophils Relative: 0.9 % (ref 0.0–3.0)
Eosinophils Absolute: 0 10*3/uL (ref 0.0–0.7)
Eosinophils Relative: 0.8 % (ref 0.0–5.0)
HCT: 35.7 % — ABNORMAL LOW (ref 36.0–46.0)
Hemoglobin: 12.4 g/dL (ref 12.0–15.0)
Lymphocytes Relative: 13.1 % (ref 12.0–46.0)
Lymphs Abs: 0.7 10*3/uL (ref 0.7–4.0)
MCHC: 34.8 g/dL (ref 30.0–36.0)
MCV: 98.1 fl (ref 78.0–100.0)
Monocytes Absolute: 0.5 10*3/uL (ref 0.1–1.0)
Monocytes Relative: 9 % (ref 3.0–12.0)
Neutro Abs: 3.9 10*3/uL (ref 1.4–7.7)
Neutrophils Relative %: 76.2 % (ref 43.0–77.0)
Platelets: 176 10*3/uL (ref 150.0–400.0)
RBC: 3.64 Mil/uL — ABNORMAL LOW (ref 3.87–5.11)
RDW: 12.9 % (ref 11.5–14.6)
WBC: 5.1 10*3/uL (ref 4.5–10.5)

## 2010-07-22 LAB — BASIC METABOLIC PANEL
BUN: 18 mg/dL (ref 6–23)
GFR: 77.17 mL/min (ref >60.00)
Potassium: 4.2 mEq/L (ref 3.5–5.1)

## 2010-07-22 LAB — IRON: Iron: 104 ug/dL (ref 42–145)

## 2010-07-22 NOTE — Assessment & Plan Note (Signed)
Increased urine, no polyuria Blood pressure well controlled

## 2010-07-22 NOTE — Assessment & Plan Note (Signed)
monitoring of blood vit d levels

## 2010-07-23 LAB — VITAMIN D 25 HYDROXY (VIT D DEFICIENCY, FRACTURES): Vit D, 25-Hydroxy: 29 ng/mL — ABNORMAL LOW (ref 30–89)

## 2010-08-05 ENCOUNTER — Other Ambulatory Visit: Payer: Self-pay | Admitting: Internal Medicine

## 2010-09-15 ENCOUNTER — Telehealth: Payer: Self-pay | Admitting: *Deleted

## 2010-09-15 MED ORDER — PREDNISONE (PAK) 10 MG PO TABS: ORAL_TABLET | ORAL | Status: AC

## 2010-09-15 NOTE — Telephone Encounter (Signed)
Poison ivy on neck, hands and stomach and spreading.  Benadryl cream is not helping.  Please send RX.

## 2010-09-15 NOTE — Telephone Encounter (Signed)
Left message on machine for pt and sent in to pharmacy

## 2010-09-15 NOTE — Telephone Encounter (Signed)
6 date 10 mg prednisone Dosepak

## 2010-09-23 NOTE — Op Note (Signed)
NAMEKESLEE, HARRINGTON                ACCOUNT NO.:  [id number]   MEDICAL RECORD NO.:  [id number]          PATIENT TYPE:  INP   LOCATION:  0008                         FACILITY:  [facility]   PHYSICIAN:  Ollen Gross, M.D.    DATE OF BIRTH:  17-Nov-1924   DATE OF PROCEDURE:  09/03/2006  DATE OF DISCHARGE:                               OPERATIVE REPORT   PREOPERATIVE DIAGNOSIS:  Osteoarthritis right knee with valgus  deformity.   POSTOPERATIVE DIAGNOSIS:  Osteoarthritis right knee with valgus  deformity.   PROCEDURE:  Right total knee arthroplasty.   SURGEON:  Ollen Gross, M.D.   ASSISTANT:  Alexzandrew L. Perkins, P.A.-C.   ANESTHESIA:  Spinal.   ESTIMATED BLOOD LOSS:  Minimal.   DRAINS:  None.   TOURNIQUET TIME:  40 minutes at 300 mmHg.   COMPLICATIONS:  None.   CONDITION:  Stable to recovery.   BRIEF CLINICAL NOTE:  Diane Little is a 77 year old female with severe end-  stage osteoarthritis of right knee with severe valgus deformity.  She  has failed nonoperative management and presents now for right total knee  arthroplasty.   DESCRIPTION OF PROCEDURE:  After successful administration of spinal  anesthetic, a tourniquet is placed high on her right thigh and right  lower extremity prepped and draped in the usual sterile fashion.  Extremity is wrapped in Esmarch.  Knee flexed.  Tourniquet inflated to  300 mmHg.  A  midline incision is made with a 10 blade through the  subcutaneous tissue to the level of the extensor mechanism.  Fresh blade  is used to make a lateral parapatellar arthrotomy.  We did a lateral  because of her severe valgus deformity.  Soft tissue over the proximal  lateral tibia is subperiosteally elevated to the joint line with a knife  and around the lateral side to the posterolateral and corner but not  including the structures of the posterolateral corner.  Patella is then  everted medially.  Knee flexed to 90 degrees, ACL and PCL removed.  Drill was used  to create a starting hole in the distal femur and the  canal was thoroughly irrigated.  A 5 degree right valgus alignment guide  was placed and referencing off the posterior condyle as rotation is  marked and a block pinned to remove 10 mm off the distal femur.  Distal  femoral resection is made with an oscillating saw.  Sizing block is  placed and size 3 is the most appropriate.  Rotation is marked off the  epicondylar axis.  The size 3 cutting block is placed and anterior,  posterior and chamfer cuts made.  Tibia subluxed forward and menisci  removed.  Extramedullary tibial alignment guide is placed referencing  proximally at the medial aspect of the tibial tubercle and distally  along the second metatarsal axis and tibial crest.  Blocks pinned to  remove 2 mm off the more deficient lateral side.  Tibia resection is  made with an oscillating saw.  The size 3 is the most appropriate tibial  component and the proximal tibia prepared with the modular drill  and  keel punch for size 3.  Femoral preparation is completed with the  intercondylar cut.   Size 3 mobile bearing tibial trial, size 3 posterior stabilized femoral  trial and a 10 mm posterior stabilized rotating platform insert trial  were placed.  With the 10, full extension is achieved with varus and  valgus balance throughout the full range of motion.  The patella is then  everted and thickness measured to be 22 mm.  Femur resection is 13 mm,  35 template is placed, lug holes are drilled, trial patella is placed  and it tracks normally.  Osteophytes are then removed off the posterior  femur with the trial in place.  All trials are removed and the cut bone  surfaces are prepared with pulsatile lavage.  Cement is mixed and once  ready for implantation, the size 3 mobile bearing tibial tray, size 3  posterior stabilized femur and 35 patella cemented in place.  The  patella was held with a clamp.  Trial 10 mm insert is placed.  Knee  held  in full extension and all extruded cement removed.  Once the cement is  fully hardened, then the permanent 10 mm posterior stabilized rotating  platform is placed in the tibial tray.  I had injected the FloSeal onto  the posterior capsule prior to doing that.  The FloSeal is then injected  into the suprapatellar region.  Medial and lateral gutters.  A moist  sponge is placed and the tourniquet is released with a total time of 40  minutes.  The bleeding was minimal and any minor bleeding was stopped  with cautery.  We then copiously irrigated with saline solution and  closed the extensor mechanism with interrupted #1 PDS.  I left open a  small area from the superior to inferior pole of the patella to serve as  a mini lateral release.  Flexion against gravity is 140 degrees.  Subcu  is closed with interrupted 2-0 Vicryl, subcuticular with running 4-0  Monocryl.  The incision is clean and dry and Steri-Strips and bulky  sterile dressing applied.  She is placed into a knee immobilizer,  awakened and transported to recovery in stable condition.      Ollen Gross, M.D.  Electronically Signed     FA/MEDQ  D:  09/03/2006  T:  09/03/2006  Job:  [id number]

## 2010-09-23 NOTE — Assessment & Plan Note (Signed)
[facility]                             PULMONARY OFFICE NOTE   Diane Little, Diane Little                   MRN:          [id number]  DATE:03/22/2007                            DOB:          1924-12-11    Date of visit:  March 22, 2007.   PROBLEM:  Rash attributed to medication.   HISTORY:  Dr. Lovell Sheehan has been treating this 77 year old woman for  hypertension.  She became aware of a pruritic flushing rash on the face  and neck about a year ago after dental work.  It improved substantially  with cortisone.  A number of medications have been tried and removed  including ACE inhibitors, ARBs, Bistolic, and she is currently on  Norvasc.  She was worse this summer, possibly related to the heat or the  medications she was on at the time.  She says she has been very much  better in the last month or so after the steroid therapy in September  and Beta Methasone 0.05% topical cream.  There has not been much  definite edema or swelling around the eyes or lips and no difficulty  with swallowing.  She has a remote history of allergic rhinitis which  fits a seasonal pattern at the time and was treated with allergy  vaccine.  She considers herself intolerant to latex but with no problems  with contrast dye or aspirin.  She has not had thyroid disease,  hepatitis, or cancer.  There has been no food intolerance, except that  she avoids shrimp, but she is not particular about what kind of problem  that caused except that it made her feel bad.   MEDICATIONS:  1. Vitamins.  2. Nasonex b.i.d.  3. Gingko.  4. Claritin 10 mg.  5. Flaxseed oil.  6. Tylenol Arthritis.  7. Ox-Cal.  8. Iron.  9. Zinc.  10.Osteo-BiFlex.  11.Norvasc 5 mg.  12.Beta Methasone 0.05% topical cream.   REVIEW OF SYSTEMS:  Exertional dyspnea, acid indigestion, sneezing,  rash.  She has not had night sweats, fever, adenopathy, or weight loss.   PAST MEDICAL HISTORY:  1. Hypertension.  2. Allergic rhinitis.  3. Knee replacement surgery, April 28.  4. Tonsillectomy, age 68.   SOCIAL HISTORY:  No alcohol, no tobacco, widowed, lives alone, has  children.   FAMILY HISTORY:  Mother had heart disease.  Nobody with significant  allergy problems.   OBJECTIVE:  Weight 140 pounds, blood pressure 110/68, pulse 82, room air  saturation 98%.  She is an alert, intelligent, appropriate woman.  HEENT:  There is very minor erythema across the forehead at the level of  the eyebrows with no skin changes around the eyes.  No significant  rhinitis.  Her pharynx is clear.  NECK:  I do not find adenopathy.  CHEST:  Clear.  HEART:  Heart sounds normal.   IMPRESSION:  Dermatitis with flushing.  This may be an atopic  dermatitis.  While early medication changes were appropriate and her  blood pressure medicine still may be causing some of this, it is not  clear that it is a medication  reaction.  If we need to change anything  else, I would take her off of all of her vitamins and herbal products  for awhile.  She feels that she is doing so much better now, possibly on  a seasonal basis, that we chose to be conservative.   PLAN:  1. Depo-Medrol 80 mg IM to give one more steroid boost in hopes the      process will resolve completely.  2. Continue Claritin.  3. She may add a second dose of an antihistamine, perhaps Claritin or      Benadryl, at bedtime.  If this clears her successfully she is going      to return p.r.n., otherwise we will begin a more active process of      elimination and assessment.     Clinton D. Maple Hudson, MD, Tonny Bollman, FACP  Electronically Signed    CDY/MedQ  DD: 03/23/2007  DT: 03/24/2007  Job #: [id number]

## 2010-09-23 NOTE — H&P (Signed)
Diane Little, Diane Little                ACCOUNT NO.:  [id number]   MEDICAL RECORD NO.:  [id number]          PATIENT TYPE:  INP   LOCATION:  1505                         FACILITY:  [facility]   PHYSICIAN:  Ollen Gross, M.D.    DATE OF BIRTH:  11-20-1924   DATE OF ADMISSION:  09/03/2006  DATE OF DISCHARGE:                              HISTORY & PHYSICAL   CHIEF COMPLAINT:  Right knee pain.   HISTORY OF PRESENT ILLNESS:  The patient is a 77 year old female who has  seen by Dr. Lequita Halt for ongoing right knee pain.  She has significant  arthritis end-stage which has been treated conservatively in the past  and also with injections.  She continues to have pain despite trying to  remain active.  She has reached the point where she is felt that she  could benefit from undergoing knee replacement.  Risks and benefits have  been discussed and she has elected to proceed with surgery.   ALLERGIES:  ALTACE.  Food allergies of SHRIMP which cause mouth ulcers  however she can eat other sea foods and also LATEX allergy.   CURRENT MEDICATIONS:  Claritin, Diovan, Nasonex, Lyrica,Tylenol  arthritis, Baneful fiber, Flow iron, Osteo BiFlex, Centrum vitamins,  folic acid, zinc, B6, vitamin E, ginkgo biloba, Os-Cal plus D, ginger  root, flaxseed oil.   PAST MEDICAL HISTORY:  Cataracts, seasonal allergies.  History of  pneumonia, hypertension.   PAST SURGICAL HISTORY:  Tonsillectomy, two foot surgeries.  Cataract  surgery.   SOCIAL HISTORY:  Widow, retired, nonsmoker.  No alcohol.  Two sons.  Would like to look into inpatient rehab possibly at [facility].   FAMILY HISTORY:  Mother deceased at age 88 with heart disease.   REVIEW OF SYSTEMS:  GENERAL:  No fevers, chills, night sweats.  NEURO:  She does have cataracts.  No seizures, syncope or paralysis.  RESPIRATORY:  History of pneumonia and seasonal allergies.  No shortness  of breath, productive cough or hemoptysis.  CARDIOVASCULAR:  No chest  pain,  angina or orthopnea.  GI: No nausea, vomiting, diarrhea or  constipation.  GU: She does have a little bit of nocturia.  No dysuria,  hematuria.  MUSCULOSKELETAL:  Right knee.   PHYSICAL EXAMINATION:  VITAL SIGNS:  Pulse 76, respirations 12, blood  pressure 138/78.  GENERAL:  A 77 year old white female well-nourished, well-developed, no  acute distress.  She is alert and cooperative.  Good historian.  HEENT: Normocephalic, atraumatic.  Pupils round and reactive.  Oropharynx clear.  EOMs intact.  NECK:  Supple.  CHEST:  Clear.  Anterior and posterior chest walls.  HEART:  Regular rate and rhythm.  S1-S2 noted with a grade 2 to 3/6  systolic ejection murmur best heard over the aortic, pulmonic and Erb's  point.  ABDOMEN:  Soft, nontender.  Bowel sounds present.  RECTAL, BREAST AND GENITALIA:  Not done per present illness.  EXTREMITIES:  Right knee shows a valgus malalignment deformity.  Range  of motion 5-120, marked crepitus, tender more lateral than medial.   IMPRESSION:  1. Osteoarthritis right knee.  2. History of cataracts.  3. Seasonal allergies.  4. History of pneumonia.  5. Hypertension.   PLAN:  The patient will be admitted to [facility] to undergo  right total knee replacement arthroplasty.  Surgery will be performed by  Dr. Ollen Gross.  Her medical physician, Dr. Darryll Capers will be  notified of the room number and admission and will be consulted if  needed for any medical assistance with the patient throughout the  hospital course.      Alexzandrew L. Perkins, P.A.C.      Ollen Gross, M.D.  Electronically Signed    ALP/MEDQ  D:  09/03/2006  T:  09/04/2006  Job:  [id number]   cc:   Stacie Glaze, MD  [address] [place]  [place] [address]   Ollen Gross, M.D.  Fax: [phone]

## 2010-09-23 NOTE — Discharge Summary (Signed)
NAMEALISEN, Diane Little                ACCOUNT NO.:  [id number]   MEDICAL RECORD NO.:  [id number]          PATIENT TYPE:  INP   LOCATION:  1605                         FACILITY:  [facility]   PHYSICIAN:  Ollen Gross, M.D.    DATE OF BIRTH:  02-14-1925   DATE OF ADMISSION:  09/03/2006  DATE OF DISCHARGE:  09/07/2006                               DISCHARGE SUMMARY   ADMISSION DIAGNOSES:  1. Osteoarthritis, right knee.  2. History of cataracts.  3. Seasonal allergies.  4. History of pneumonia.  5. Hypertension.   DISCHARGE DIAGNOSES:  1. Osteoarthritis, right knee, with a valgus malalignment deformity,      status post right total knee arthroplasty.  2. Postoperative blood loss anemia.  3. Status post transfusion without sequelae.  4. Mild postoperative hypokalemia, improved.  5. Osteoarthritis, right knee.  6. History of cataracts.  7. Seasonal allergies.  8. History of pneumonia.  9. Hypertension.   PROCEDURE:  September 03, 2006, right total knee arthroplasty.  Surgeon:  Ollen Gross, M.D.  Assistant:  Alexzandrew L. Perkins, P.A.-C.  Spinal  anesthesia.   CONSULTATIONS:  None.   BRIEF HISTORY:  Diane Little is a 77 year old female with severe end-stage  arthritis of the right knee with severe valgus malalignment deformity.  She failed nonoperative management and now presents for a total knee  arthroplasty.   LABORATORY DATA:  Her preop CBC showed a hemoglobin of 12.2, hematocrit  of 35.7, white cell count 5.7.  Her chemistry panel on admission all  within normal limits.  PT/INR preop 12.5 and 0.9 with a PTT of 35.  Her  preop UA was negative.  Serial CBCs were followed.  Hemoglobin did drift  down postoperatively down to 8.4, then drifted further down to 8.1.  She  was given 2 units of blood, post-transfusion hemoglobin back up to 9.8  with a hematocrit of 28.1.  Serial pro times were followed.  Last noted  PT/INR 22.1 and 1.8.  Serial BMETs were followed after the admission  chemistry panel.  Potassium did drop down to 3.4, came back to 4.2.   Chest x-ray August 24, 2006:  Blunting of right costophrenic angle likely  due to scar, right pleural effusion not excluded.  Thoracolumbar  scoliosis.   Preop EKG dated August 22, 2006:  No change from November 2004.  Right  bundle branch block.  Slight Q in I and aVL, old.  Old IRI.  This was  confirmed.  Follow-up EKG September 05, 2006:  Normal sinus rhythm.  Voltage  criteria for left ventricular hypertrophy, unconfirmed.   HOSPITAL COURSE:  The patient was admitted to [facility],  tolerated the procedure well, later transferred to the recovery room and  the orthopedic floor.  Started on PCA and p.o. analgesics for pain  control following surgery, given 24 hours of postop IV antibiotics in  the form of Ancef.  Started on Coumadin for DVT prophylaxis.  Had a  fairly decent night on the evening of surgery.  Seen in rounds by Dr.  Lequita Halt on day #1, started getting up out of bed.  No drain was used.  Her hemoglobin had dropped down to 8.4.  She was asymptomatic with this.  We followed her hemoglobin and her pressure.  Her pressure was stable.  She had a little low output since midnight.  We gave her some gentle  fluids and some mild Lasix.  Her output did improve later that day and  the following day the PCA was discontinued on day #1 and the fluids were  reduced and we weaned over to p.o. medications.  Started getting up out  of bed to a chair.  By day #2, unfortunately the hemoglobin drifted down  a little bit further down to 8.1.  We discussed blood and she was in  agreement with that and we gave her 2 units, which she tolerated well.  She was also placed on iron supplementation for the low blood count.  She was a little slow to progress with her physical therapy.  By day #2  she was only getting up walking about 12 feet.  She wanted to look into  inpatient rehab since she was widowed and lived alone.   Discharge  planning started sending out information.  She wanted to look into  [facility], which was close to home.  By day #3 she was doing a little  bit better.  Her hemoglobin had come back up to 9.8 following the  transfusion.  Her output did very well.  The dressing was changed on day  #2 and on day #2 and day #3 the incision looked excellent, but she did  develop a small blister on the proximal tibia distal to the incision,  which may have been from the Ace wrap or even the PAS, so what we did is  we discontinued the PAS on day #2 so she would not have compression on  that.  We covered that with an OpSite.  On the evening of day #3 she did  have some loose stool which was black and tarry.  We did Hemoccult that  and it was negative, no blood found in the stool.  Felt to be due to the  iron, so we were going to discontinue iron since the hemoglobin was back  up.  It was noted that a bed was available at [facility].  She was  seen on rounds on Sep 07, 2006, by Dr. Lequita Halt, doing well, and was  discharged over at that time.   DISCHARGE PLAN:  The patient was transferred to [facility] nursing  facility.   DISCHARGE DIAGNOSES:  Please see above.   DISCHARGE MEDICATIONS:  1. Coumadin per protocol.  Please titrate the Coumadin level for a      target INR between 2.0 and 3.0.  The patient will be on Coumadin      for 3 weeks from date of surgery, September 03, 2006.  2. Colace 100 mg p.o. b.i.d.  3. Diovan 160 mg p.o. daily.  4. Claritin 10 mg p.o. daily.  5. Nasonex spray two sprays each nostril twice a day.  6. Percocet one or two every 4-6 hours as needed for pain.  7. Tylenol one or two every 4-6 hours as needed for mild pain,      temperature or headache.  8. Reglan p.o. q.8h. p.r.n. nausea.  9. Robaxin 500 mg p.o. q.6-8h. p.r.n. spasm.  10.Ambien 5 mg p.o. q.h.s. p.r.n. sleep.   DIET:  Diet as tolerated, heart-healthy diet.  ACTIVITY:  She may be weightbearing as tolerated  to the right lower  extremity.  Continue gait training, ambulation, ADLs per total knee  protocol.  Range of motion and strengthening exercises.   Operative Tegaderm site to the blister on the lower leg.  May change  after 3 days.  Otherwise a dry dressing, gauze, paper tape to the right  knee incision.  She may start showering; however, do not submerse the  incision under water.   FOLLOW-UP:  She is to follow up with Dr. Lequita Halt in the office at the  [place] office of [facility].  Please contact  the office at [phone] to arrange an appointment time and transfer the  patient.   DISPOSITION:  [facility] nursing facility.   CONDITION ON DISCHARGE:  Improving.      Alexzandrew L. Perkins, P.A.C.      Ollen Gross, M.D.  Electronically Signed    ALP/MEDQ  D:  09/07/2006  T:  09/07/2006  Job:  [id number]   cc:   Stacie Glaze, MD  [address] [place]  [place] [address]

## 2011-01-11 ENCOUNTER — Other Ambulatory Visit: Payer: Self-pay | Admitting: Internal Medicine

## 2011-01-23 ENCOUNTER — Ambulatory Visit: Payer: MEDICARE | Admitting: Internal Medicine

## 2011-02-01 ENCOUNTER — Ambulatory Visit (INDEPENDENT_AMBULATORY_CARE_PROVIDER_SITE_OTHER): Payer: Medicare Other | Admitting: Internal Medicine

## 2011-02-01 ENCOUNTER — Encounter: Payer: Self-pay | Admitting: Internal Medicine

## 2011-02-01 VITALS — BP 140/80 | HR 76 | Temp 98.2°F | Resp 16 | Ht 66.0 in | Wt 139.0 lb

## 2011-02-01 DIAGNOSIS — Z23 Encounter for immunization: Secondary | ICD-10-CM

## 2011-02-01 DIAGNOSIS — D509 Iron deficiency anemia, unspecified: Secondary | ICD-10-CM

## 2011-02-01 DIAGNOSIS — E559 Vitamin D deficiency, unspecified: Secondary | ICD-10-CM

## 2011-02-01 DIAGNOSIS — I10 Essential (primary) hypertension: Secondary | ICD-10-CM

## 2011-02-01 MED ORDER — VITAMIN D 1000 UNITS PO CAPS: 1000.0000 [IU] | ORAL_CAPSULE | Freq: Every day | ORAL | Status: AC

## 2011-02-01 MED ORDER — AMLODIPINE BESYLATE 5 MG PO TABS: 5.0000 mg | ORAL_TABLET | Freq: Every day | ORAL | Status: DC

## 2011-02-01 NOTE — Patient Instructions (Signed)
Stop the vitamin e

## 2011-02-01 NOTE — Progress Notes (Signed)
Subjective:    Patient ID: Diane Little, female    DOB: 1924-05-09, 77 y.o.   MRN: [id number]  HPI  Patient is an 77 year old female who is followed for hypertension and a history of anemia and osteoarthritis.  She is to donate blood on a regular basis and this was etiology behind her blood loss she has since been on B12 and iron and presents with a stable hemoglobin hematocrit and iron level.  We feel this time we can discontinue the iron and monitor her since she no longer donates blood.  Her blood pressures been stable on her current medication she denies any chest pain or abnormal shortness breath PND orthopnea she continues to take calcium but is not on a vitamin D supplement talked about vitamin D supplementation given her low vitamin D level in her screening laboratory values  Review of Systems  Constitutional: Negative for activity change, appetite change and fatigue.  HENT: Negative for ear pain, congestion, neck pain, postnasal drip and sinus pressure.   Eyes: Negative for redness and visual disturbance.  Respiratory: Negative for cough, shortness of breath and wheezing.   Gastrointestinal: Negative for abdominal pain and abdominal distention.  Genitourinary: Negative for dysuria, frequency and menstrual problem.  Musculoskeletal: Negative for myalgias, joint swelling and arthralgias.  Skin: Negative for rash and wound.  Neurological: Negative for dizziness, weakness and headaches.  Hematological: Negative for adenopathy. Does not bruise/bleed easily.  Psychiatric/Behavioral: Negative for sleep disturbance and decreased concentration.   Past Medical History  Diagnosis Date  . Allergy   . Anemia   . Hypertension   . Osteoporosis   . Nail fungus   . Rash    Past Surgical History  Procedure Date  . Total knee arthroplasty   . Replacement total knee   . Tonsillectomy   . Eye surgery     cataracts bilateral    reports that she has never smoked. She does not  have any smokeless tobacco history on file. She reports that she does not drink alcohol or use illicit drugs. family history includes Heart disease in her mother. Allergies  Allergen Reactions  . Pneumococcal Vaccine Polyvalent   . Ramipril     REACTION: rash       Objective:   Physical Exam  Nursing note and vitals reviewed. Constitutional: She is oriented to person, place, and time. She appears well-developed and well-nourished. No distress.  HENT:  Head: Normocephalic and atraumatic.  Right Ear: External ear normal.  Left Ear: External ear normal.  Nose: Nose normal.  Mouth/Throat: Oropharynx is clear and moist.  Eyes: Conjunctivae and EOM are normal. Pupils are equal, round, and reactive to light.  Neck: Normal range of motion. Neck supple. No JVD present. No tracheal deviation present. No thyromegaly present.  Cardiovascular: Normal rate, regular rhythm, normal heart sounds and intact distal pulses.   No murmur heard. Pulmonary/Chest: Effort normal and breath sounds normal. She has no wheezes. She exhibits no tenderness.  Abdominal: Soft. Bowel sounds are normal.  Musculoskeletal: Normal range of motion. She exhibits no edema and no tenderness.  Lymphadenopathy:    She has no cervical adenopathy.  Neurological: She is alert and oriented to person, place, and time. She has normal reflexes. No cranial nerve deficit.  Skin: Skin is warm and dry. She is not diaphoretic.  Psychiatric: She has a normal mood and affect. Her behavior is normal.          Assessment & Plan:  We will continue her  medication as ordered we have added vitamin D 1000 international units daily her anemia stable we will discontinue her iron we will monitor her osteoporosis and had appropriate one-year interval continue all medications refilled those that are needed including Norvasc which prescription was sent to her pharmacy.  She had her flu vaccination today she is allergic to the pneumonia  vaccination

## 2011-03-09 ENCOUNTER — Other Ambulatory Visit: Payer: Self-pay | Admitting: Internal Medicine

## 2011-03-21 ENCOUNTER — Other Ambulatory Visit (INDEPENDENT_AMBULATORY_CARE_PROVIDER_SITE_OTHER): Payer: Medicare Other

## 2011-03-21 ENCOUNTER — Other Ambulatory Visit: Payer: Self-pay | Admitting: *Deleted

## 2011-03-21 DIAGNOSIS — M810 Age-related osteoporosis without current pathological fracture: Secondary | ICD-10-CM

## 2011-03-22 ENCOUNTER — Other Ambulatory Visit: Payer: Medicare Other

## 2011-03-22 LAB — BASIC METABOLIC PANEL
CO2: 29 mEq/L (ref 19–32)
Chloride: 105 mEq/L (ref 96–112)
Glucose, Bld: 94 mg/dL (ref 70–99)
Potassium: 3.9 mEq/L (ref 3.5–5.1)
Sodium: 140 mEq/L (ref 135–145)

## 2011-04-03 ENCOUNTER — Telehealth: Payer: Self-pay | Admitting: Internal Medicine

## 2011-04-03 MED ORDER — MOMETASONE FUROATE 50 MCG/ACT NA SUSP: 2.0000 | Freq: Two times a day (BID) | NASAL | Status: DC

## 2011-04-03 NOTE — Telephone Encounter (Signed)
Pt requesting refill on Nasonex. Please send to [facility] [address]

## 2011-04-03 NOTE — Telephone Encounter (Signed)
done

## 2011-04-10 ENCOUNTER — Other Ambulatory Visit (HOSPITAL_COMMUNITY): Payer: Self-pay | Admitting: *Deleted

## 2011-04-11 ENCOUNTER — Encounter (HOSPITAL_COMMUNITY)
Admission: RE | Admit: 2011-04-11 | Discharge: 2011-04-11 | Disposition: A | Discharge status: Home / Self Care | Payer: Medicare Other | Source: Ambulatory Visit | Attending: Internal Medicine | Admitting: Internal Medicine

## 2011-04-11 DIAGNOSIS — M810 Age-related osteoporosis without current pathological fracture: Secondary | ICD-10-CM | POA: Insufficient documentation

## 2011-04-11 MED ORDER — ZOLEDRONIC ACID 5 MG/100ML IV SOLN
5.0000 mg | Freq: Once | INTRAVENOUS | Status: AC
  Administered 2011-04-11: 5 mg via INTRAVENOUS
  Filled 2011-04-11: qty 100

## 2011-04-11 MED ORDER — SODIUM CHLORIDE 0.9 % IV SOLN
Freq: Once | INTRAVENOUS | Status: AC
  Administered 2011-04-11: 11:00:00 via INTRAVENOUS

## 2011-05-04 NOTE — Progress Notes (Signed)
  Subjective:    Patient ID: Diane Little, female    DOB: 1924/06/01, 77 y.o.   MRN: [id number]  HPI    Review of Systems     Objective:   Physical Exam        Assessment & Plan:

## 2011-05-12 ENCOUNTER — Other Ambulatory Visit: Payer: Self-pay | Admitting: Internal Medicine

## 2011-06-02 ENCOUNTER — Ambulatory Visit: Payer: Medicare Other | Admitting: Internal Medicine

## 2011-06-14 ENCOUNTER — Ambulatory Visit (INDEPENDENT_AMBULATORY_CARE_PROVIDER_SITE_OTHER): Payer: Medicare Other | Admitting: Internal Medicine

## 2011-06-14 ENCOUNTER — Encounter: Payer: Self-pay | Admitting: Internal Medicine

## 2011-06-14 DIAGNOSIS — D509 Iron deficiency anemia, unspecified: Secondary | ICD-10-CM

## 2011-06-14 DIAGNOSIS — I10 Essential (primary) hypertension: Secondary | ICD-10-CM

## 2011-06-14 DIAGNOSIS — E611 Iron deficiency: Secondary | ICD-10-CM

## 2011-06-14 DIAGNOSIS — D649 Anemia, unspecified: Secondary | ICD-10-CM

## 2011-06-14 DIAGNOSIS — M810 Age-related osteoporosis without current pathological fracture: Secondary | ICD-10-CM

## 2011-06-14 DIAGNOSIS — Z79899 Other long term (current) drug therapy: Secondary | ICD-10-CM

## 2011-06-14 NOTE — Patient Instructions (Signed)
The patient is instructed to continue all medications as prescribed. Schedule followup with check out clerk upon leaving the clinic  

## 2011-06-14 NOTE — Progress Notes (Signed)
Subjective:    Patient ID: Diane Little, female    DOB: 1924-07-24, 78 y.o.   MRN: [id number]  HPI  Patient has osteoporosis and is getting reclast injections.  Her renal functions have been normal we will check a calcium and vitamin D level today so that she continue her injection therapy as indicated. Her last shot was in Dec.   Review of Systems  Constitutional: Negative for activity change, appetite change and fatigue.  HENT: Negative for ear pain, congestion, neck pain, postnasal drip and sinus pressure.   Eyes: Negative for redness and visual disturbance.  Respiratory: Negative for cough, shortness of breath and wheezing.   Gastrointestinal: Negative for abdominal pain and abdominal distention.  Genitourinary: Negative for dysuria, frequency and menstrual problem.  Musculoskeletal: Negative for myalgias, joint swelling and arthralgias.  Skin: Negative for rash and wound.  Neurological: Negative for dizziness, weakness and headaches.  Hematological: Negative for adenopathy. Does not bruise/bleed easily.  Psychiatric/Behavioral: Negative for sleep disturbance and decreased concentration.   Past Medical History  Diagnosis Date  . Allergy   . Anemia   . Hypertension   . Osteoporosis   . Nail fungus   . Rash     History   Social History  . Marital Status: Single    Spouse Name: N/A    Number of Children: N/A  . Years of Education: N/A   Occupational History  . retired    Social History Main Topics  . Smoking status: Never Smoker   . Smokeless tobacco: Not on file  . Alcohol Use: No  . Drug Use: No  . Sexually Active: Not Currently   Other Topics Concern  . Not on file   Social History Narrative  . No narrative on file    Past Surgical History  Procedure Date  . Total knee arthroplasty   . Replacement total knee   . Tonsillectomy   . Eye surgery     cataracts bilateral    Family History  Problem Relation Age of Onset  . Heart disease  Mother     Allergies  Allergen Reactions  . Pneumococcal Vaccine Polyvalent   . Ramipril     REACTION: rash    Current Outpatient Prescriptions on File Prior to Visit  Medication Sig Dispense Refill  . acetaminophen (TYLENOL) 650 MG CR tablet Take 650 mg by mouth every 8 (eight) hours as needed.        Marland Kitchen amLODipine (NORVASC) 5 MG tablet Take 1 tablet (5 mg total) by mouth daily.  30 tablet  5  . amoxicillin (AMOXIL) 500 MG capsule Take 500 mg by mouth. 4 PRIOR TO DENTAL WORK       . betamethasone dipropionate (DIPROLENE) 0.05 % cream Apply topically. APPLY AS DIRECTED       . calcium-vitamin D (OSCAL WITH D 500-200) 500-200 MG-UNIT per tablet Take 1 tablet by mouth 2 (two) times daily.        . Cholecalciferol (VITAMIN D) 1000 UNITS capsule Take 1 capsule (1,000 Units total) by mouth daily.      . diphenhydrAMINE (SOMINEX) 25 MG tablet Take 25 mg by mouth at bedtime as needed.        . folic acid (FOLVITE) 400 MCG tablet Take 400 mcg by mouth daily.        . Ginger, Zingiber officinalis, (GINGER ROOT) 500 MG CAPS Take by mouth daily.        . Ginkgo Biloba (GNP GINGKO BILOBA EXTRACT PO) Take  by mouth daily.        Marland Kitchen loratadine (CLARITIN) 10 MG tablet TAKE ONE TABLET BY MOUTH EVERY DAY  90 tablet  3  . Misc Natural Products (OSTEO BI-FLEX ADV DOUBLE ST PO) Take by mouth daily.        . mometasone (NASONEX) 50 MCG/ACT nasal spray Place 2 sprays into the nose 2 (two) times daily.  17 g  11  . Multiple Vitamin (MULTIVITAMIN) tablet Take 1 tablet by mouth daily.        Marland Kitchen pyridoxine (B-6) 50 MG tablet Take 50 mg by mouth daily.        . Vit C-Quercet-Bioflv-Bromelain 450-250-125-50 MG CAPS Take by mouth.        . vitamin B-12 (CYANOCOBALAMIN) 250 MCG tablet Take 250 mcg by mouth daily.        Marland Kitchen zinc gluconate 50 MG tablet Take 50 mg by mouth daily.          BP 136/80  Pulse 72  Temp 98.3 F (36.8 C)  Resp 16  Ht 5\' 6"  (1.676 m)  Wt 134 lb (60.782 kg)  BMI 21.63 kg/m2         Objective:   Physical Exam  Nursing note and vitals reviewed. Constitutional: She is oriented to person, place, and time. She appears well-developed and well-nourished. No distress.  HENT:  Head: Normocephalic and atraumatic.  Right Ear: External ear normal.  Left Ear: External ear normal.  Nose: Nose normal.  Mouth/Throat: Oropharynx is clear and moist.  Eyes: Conjunctivae and EOM are normal. Pupils are equal, round, and reactive to light.  Neck: Normal range of motion. Neck supple. No JVD present. No tracheal deviation present. No thyromegaly present.  Cardiovascular: Normal rate, regular rhythm, normal heart sounds and intact distal pulses.   No murmur heard. Pulmonary/Chest: Effort normal and breath sounds normal. She has no wheezes. She exhibits no tenderness.  Abdominal: Soft. Bowel sounds are normal.  Musculoskeletal: Normal range of motion. She exhibits no edema and no tenderness.  Lymphadenopathy:    She has no cervical adenopathy.  Neurological: She is alert and oriented to person, place, and time. She has normal reflexes. No cranial nerve deficit.  Skin: Skin is warm and dry. She is not diaphoretic.  Psychiatric: She has a normal mood and affect. Her behavior is normal.          Assessment & Plan:  Monitor her calcium level since she has had a recent reclast injection. Her blood pressure is stable on her current medications her allergies are stable her current medications we will measure her CBC differential B12 and iron level for history of anemia she is currently on iron tablets and B12 tablets orally.  Otherwise she is stable we encouraged her aspects or size of a daily basis to keep her weight stable

## 2011-06-15 LAB — CBC WITH DIFFERENTIAL/PLATELET
Basophils Absolute: 0 10*3/uL (ref 0.0–0.1)
HCT: 35.1 % — ABNORMAL LOW (ref 36.0–46.0)
Hemoglobin: 11.9 g/dL — ABNORMAL LOW (ref 12.0–15.0)
Lymphs Abs: 0.9 10*3/uL (ref 0.7–4.0)
MCHC: 34 g/dL (ref 30.0–36.0)
MCV: 98.5 fl (ref 78.0–100.0)
Monocytes Absolute: 0.4 10*3/uL (ref 0.1–1.0)
Monocytes Relative: 9.7 % (ref 3.0–12.0)
Neutro Abs: 3.2 10*3/uL (ref 1.4–7.7)
RDW: 13.2 % (ref 11.5–14.6)

## 2011-08-02 ENCOUNTER — Telehealth: Payer: Self-pay | Admitting: Internal Medicine

## 2011-08-02 NOTE — Telephone Encounter (Signed)
Pt decline ov with NP tomorrow. Pt is urinating a lot requesting new med oxybutynin call into [facility] [phone]

## 2011-08-03 MED ORDER — OXYBUTYNIN CHLORIDE 5 MG PO TABS: 5.0000 mg | ORAL_TABLET | Freq: Two times a day (BID) | ORAL | Status: DC

## 2011-08-03 NOTE — Telephone Encounter (Signed)
Ok 5 mg oxybutynin XR  #60 one every morning

## 2011-08-03 NOTE — Telephone Encounter (Signed)
rx sent in electronically, pt aware 

## 2011-08-22 ENCOUNTER — Encounter: Payer: Self-pay | Admitting: Internal Medicine

## 2011-08-28 ENCOUNTER — Other Ambulatory Visit: Payer: Self-pay | Admitting: Internal Medicine

## 2011-08-28 NOTE — Telephone Encounter (Signed)
Dr. Jenkins pt. 

## 2011-10-26 ENCOUNTER — Other Ambulatory Visit: Payer: Self-pay | Admitting: *Deleted

## 2011-12-12 ENCOUNTER — Encounter: Payer: Medicare Other | Admitting: Internal Medicine

## 2012-01-18 ENCOUNTER — Telehealth: Payer: Self-pay | Admitting: Internal Medicine

## 2012-01-18 NOTE — Telephone Encounter (Signed)
Pt called req refill of oxybutynin (DITROPAN) 5 MG tablet to [facility] on [address] [phone]. Pt is completely out of med and is really having problems with incontinence. Pls call in asap.

## 2012-01-19 ENCOUNTER — Other Ambulatory Visit: Payer: Self-pay | Admitting: *Deleted

## 2012-01-19 MED ORDER — OXYBUTYNIN CHLORIDE 5 MG PO TABS: 5.0000 mg | ORAL_TABLET | Freq: Two times a day (BID) | ORAL | Status: DC

## 2012-01-19 NOTE — Telephone Encounter (Signed)
done

## 2012-01-30 ENCOUNTER — Other Ambulatory Visit: Payer: Self-pay | Admitting: Internal Medicine

## 2012-02-28 ENCOUNTER — Ambulatory Visit (INDEPENDENT_AMBULATORY_CARE_PROVIDER_SITE_OTHER): Payer: Medicare Other | Admitting: Internal Medicine

## 2012-02-28 ENCOUNTER — Encounter: Payer: Self-pay | Admitting: Internal Medicine

## 2012-02-28 VITALS — BP 136/80 | HR 72 | Temp 98.3°F | Resp 16 | Ht 66.0 in | Wt 124.0 lb

## 2012-02-28 DIAGNOSIS — I10 Essential (primary) hypertension: Secondary | ICD-10-CM

## 2012-02-28 DIAGNOSIS — E785 Hyperlipidemia, unspecified: Secondary | ICD-10-CM

## 2012-02-28 DIAGNOSIS — N3281 Overactive bladder: Secondary | ICD-10-CM | POA: Insufficient documentation

## 2012-02-28 DIAGNOSIS — N318 Other neuromuscular dysfunction of bladder: Secondary | ICD-10-CM

## 2012-02-28 DIAGNOSIS — Z23 Encounter for immunization: Secondary | ICD-10-CM

## 2012-02-28 DIAGNOSIS — Z0000 Encounter for general adult medical examination without abnormal findings: Secondary | ICD-10-CM

## 2012-02-28 DIAGNOSIS — D509 Iron deficiency anemia, unspecified: Secondary | ICD-10-CM

## 2012-02-28 DIAGNOSIS — Z888 Allergy status to other drugs, medicaments and biological substances status: Secondary | ICD-10-CM

## 2012-02-28 DIAGNOSIS — L089 Local infection of the skin and subcutaneous tissue, unspecified: Secondary | ICD-10-CM

## 2012-02-28 LAB — LIPID PANEL
Cholesterol: 168 mg/dL (ref 0–200)
HDL: 49.7 mg/dL (ref >39.00)
LDL Cholesterol: 99 mg/dL (ref 0–99)
Triglycerides: 95 mg/dL (ref 0.0–149.0)
VLDL: 19 mg/dL (ref 0.0–40.0)

## 2012-02-28 LAB — BASIC METABOLIC PANEL
Chloride: 104 mEq/L (ref 96–112)
GFR: 81.82 mL/min (ref >60.00)
Potassium: 3.7 mEq/L (ref 3.5–5.1)
Sodium: 140 mEq/L (ref 135–145)

## 2012-02-28 LAB — CBC WITH DIFFERENTIAL/PLATELET
Basophils Absolute: 0 10*3/uL (ref 0.0–0.1)
Basophils Relative: 0.6 % (ref 0.0–3.0)
Eosinophils Absolute: 0 10*3/uL (ref 0.0–0.7)
MCHC: 32.9 g/dL (ref 30.0–36.0)
MCV: 99 fl (ref 78.0–100.0)
Monocytes Absolute: 0.5 10*3/uL (ref 0.1–1.0)
Neutrophils Relative %: 70.4 % (ref 43.0–77.0)
Platelets: 167 10*3/uL (ref 150.0–400.0)
RDW: 13.2 % (ref 11.5–14.6)

## 2012-02-28 LAB — POCT URINALYSIS DIPSTICK
Glucose, UA: NEGATIVE
Leukocytes, UA: NEGATIVE
Nitrite, UA: NEGATIVE
Spec Grav, UA: 1.025
Urobilinogen, UA: 0.2

## 2012-02-28 MED ORDER — DOXYCYCLINE HYCLATE 100 MG PO TABS: 100.0000 mg | ORAL_TABLET | Freq: Two times a day (BID) | ORAL | Status: DC

## 2012-02-28 NOTE — Progress Notes (Signed)
Subjective:    Patient ID: Diane Little, female    DOB: 07-05-1924, 78 y.o.   MRN: [id number]  HPI Patient is an 78 year old female who presents for followup of hypertension history of iron deficiency anemia history of allergic rhinitis and a history of mild hyperlipidemia.  She presents today for appropriate monitoring blood work as well as for a [employer] examination and appropriate health maintenance screening.  A pressure stable she denies any chest pain any shortness of breath she has noted some knots in her chest which are at the costochondral junction and appears to be arthritic in etiology she has also noticed some pelvic adenopathy   Review of Systems  Constitutional: Positive for fatigue. Negative for fever, activity change and appetite change.  HENT: Positive for hearing loss. Negative for ear pain, congestion, neck pain, postnasal drip and sinus pressure.   Eyes: Negative for redness and visual disturbance.  Respiratory: Negative for cough, shortness of breath and wheezing.   Gastrointestinal: Negative for abdominal pain and abdominal distention.  Genitourinary: Negative for dysuria, frequency and menstrual problem.  Musculoskeletal: Positive for back pain, joint swelling and gait problem. Negative for myalgias and arthralgias.  Skin: Negative for rash and wound.  Neurological: Negative for dizziness, weakness and headaches.  Hematological: Negative for adenopathy. Does not bruise/bleed easily.  Psychiatric/Behavioral: Negative for disturbed wake/sleep cycle and decreased concentration.       Objective:   Physical Exam  Nursing note and vitals reviewed. Constitutional: She is oriented to person, place, and time. She appears well-developed and well-nourished. No distress.  HENT:  Head: Normocephalic and atraumatic.  Right Ear: External ear normal.  Left Ear: External ear normal.  Nose: Nose normal.  Mouth/Throat: Oropharynx is clear and moist.  Eyes:  Conjunctivae normal and EOM are normal. Pupils are equal, round, and reactive to light.  Neck: Normal range of motion. Neck supple. No JVD present. No tracheal deviation present. No thyromegaly present.  Cardiovascular: Normal rate, regular rhythm, normal heart sounds and intact distal pulses.   No murmur heard. Pulmonary/Chest: Effort normal and breath sounds normal. She has no wheezes. She exhibits no tenderness.  Abdominal: Soft. Bowel sounds are normal. She exhibits distension.  Musculoskeletal: Normal range of motion. She exhibits no edema and no tenderness.  Lymphadenopathy:    She has no cervical adenopathy.       Right: Inguinal adenopathy present.  Neurological: She is alert and oriented to person, place, and time. She has normal reflexes. No cranial nerve deficit.  Skin: Skin is warm and dry. She is not diaphoretic.  Psychiatric: She has a normal mood and affect. Her behavior is normal.          Assessment & Plan:  We will monitor a lipid panel today and a CBC for history of iron deficiency anemia we'll look at a basic metabolic panel since she has treated hypertension with special attention to her potassium and creatinine.  We'll evaluate the adenopathy with a CBC differential as well as give her a course of doxycycline for possible skin infection.  Medicare wellness examination performed Subjective:    Diane Little is a 78 y.o. female who presents for Medicare Annual/Subsequent preventive examination.  Preventive Screening-Counseling & Management  Tobacco History  Smoking status  . Never Smoker   Smokeless tobacco  . Not on file     Problems Prior to Visit 1.   Current Problems (verified) Patient Active Problem List  Diagnosis  . CANDIDIASIS, SKIN/NAILS  . ANEMIA-IRON  DEFICIENCY  . HYPERTENSION  . ALLERGIC RHINITIS  . OSTEOPOROSIS  . MAMMOGRAM, ABNORMAL, LEFT  . ADVEF, DRUG/MED/BIOL SUBST, DRUG ALLERGY NEC  . TOTAL KNEE REPLACEMENT, RIGHT, HX OF    . Overactive bladder    Medications Prior to Visit Current Outpatient Prescriptions on File Prior to Visit  Medication Sig Dispense Refill  . acetaminophen (TYLENOL) 650 MG CR tablet Take 650 mg by mouth every 8 (eight) hours as needed.        Marland Kitchen amLODipine (NORVASC) 5 MG tablet TAKE 1 TABLET BY MOUTH DAILY  30 tablet  4  . amoxicillin (AMOXIL) 500 MG capsule Take 500 mg by mouth. 4 PRIOR TO DENTAL WORK       . betamethasone dipropionate (DIPROLENE) 0.05 % cream Apply topically. APPLY AS DIRECTED       . calcium-vitamin D (OSCAL WITH D 500-200) 500-200 MG-UNIT per tablet Take 1 tablet by mouth 2 (two) times daily.        . diphenhydrAMINE (SOMINEX) 25 MG tablet Take 25 mg by mouth at bedtime as needed.        . folic acid (FOLVITE) 400 MCG tablet Take 400 mcg by mouth daily.        . Ginger, Zingiber officinalis, (GINGER ROOT) 500 MG CAPS Take by mouth daily.        . Ginkgo Biloba (GNP GINGKO BILOBA EXTRACT PO) Take by mouth daily.        Marland Kitchen loratadine (CLARITIN) 10 MG tablet TAKE ONE TABLET BY MOUTH EVERY DAY  90 tablet  3  . Misc Natural Products (OSTEO BI-FLEX ADV DOUBLE ST PO) Take by mouth daily.        . mometasone (NASONEX) 50 MCG/ACT nasal spray Place 2 sprays into the nose 2 (two) times daily.  17 g  11  . Multiple Vitamin (MULTIVITAMIN) tablet Take 1 tablet by mouth daily.        Marland Kitchen oxybutynin (DITROPAN) 5 MG tablet Take 1 tablet (5 mg total) by mouth 2 (two) times daily.  60 tablet  11  . pyridoxine (B-6) 50 MG tablet Take 50 mg by mouth daily.        . Vit C-Quercet-Bioflv-Bromelain 450-250-125-50 MG CAPS Take by mouth.        . vitamin B-12 (CYANOCOBALAMIN) 250 MCG tablet Take 250 mcg by mouth daily.        Marland Kitchen zinc gluconate 50 MG tablet Take 50 mg by mouth daily.          Current Medications (verified) Current Outpatient Prescriptions  Medication Sig Dispense Refill  . acetaminophen (TYLENOL) 650 MG CR tablet Take 650 mg by mouth every 8 (eight) hours as needed.        Marland Kitchen  amLODipine (NORVASC) 5 MG tablet TAKE 1 TABLET BY MOUTH DAILY  30 tablet  4  . amoxicillin (AMOXIL) 500 MG capsule Take 500 mg by mouth. 4 PRIOR TO DENTAL WORK       . betamethasone dipropionate (DIPROLENE) 0.05 % cream Apply topically. APPLY AS DIRECTED       . calcium-vitamin D (OSCAL WITH D 500-200) 500-200 MG-UNIT per tablet Take 1 tablet by mouth 2 (two) times daily.        . diphenhydrAMINE (SOMINEX) 25 MG tablet Take 25 mg by mouth at bedtime as needed.        . folic acid (FOLVITE) 400 MCG tablet Take 400 mcg by mouth daily.        . Ginger, Zingiber officinalis, (  GINGER ROOT) 500 MG CAPS Take by mouth daily.        . Ginkgo Biloba (GNP GINGKO BILOBA EXTRACT PO) Take by mouth daily.        Marland Kitchen loratadine (CLARITIN) 10 MG tablet TAKE ONE TABLET BY MOUTH EVERY DAY  90 tablet  3  . Misc Natural Products (OSTEO BI-FLEX ADV DOUBLE ST PO) Take by mouth daily.        . mometasone (NASONEX) 50 MCG/ACT nasal spray Place 2 sprays into the nose 2 (two) times daily.  17 g  11  . Multiple Vitamin (MULTIVITAMIN) tablet Take 1 tablet by mouth daily.        Marland Kitchen oxybutynin (DITROPAN) 5 MG tablet Take 1 tablet (5 mg total) by mouth 2 (two) times daily.  60 tablet  11  . pyridoxine (B-6) 50 MG tablet Take 50 mg by mouth daily.        . Vit C-Quercet-Bioflv-Bromelain 450-250-125-50 MG CAPS Take by mouth.        . vitamin B-12 (CYANOCOBALAMIN) 250 MCG tablet Take 250 mcg by mouth daily.        Marland Kitchen zinc gluconate 50 MG tablet Take 50 mg by mouth daily.        Marland Kitchen doxycycline (VIBRA-TABS) 100 MG tablet Take 1 tablet (100 mg total) by mouth 2 (two) times daily.  20 tablet  0     Allergies (verified) Pneumococcal vaccine polyvalent and Ramipril   PAST HISTORY  Family History Family History  Problem Relation Age of Onset  . Heart disease Mother     Social History History  Substance Use Topics  . Smoking status: Never Smoker   . Smokeless tobacco: Not on file  . Alcohol Use: No     Are there smokers in  your home (other than you)? No  Risk Factors Current exercise habits: The patient does not participate in regular exercise at present.  Dietary issues discussed: reveiwed diet   Cardiac risk factors: advanced age (older than 91 for men, 92 for women), dyslipidemia, hypertension and sedentary lifestyle.  Depression Screen (Note: if answer to either of the following is "Yes", a more complete depression screening is indicated)   Over the past two weeks, have you felt down, depressed or hopeless? No  Over the past two weeks, have you felt little interest or pleasure in doing things? No  Have you lost interest or pleasure in daily life? No  Do you often feel hopeless? No  Do you cry easily over simple problems? No  Activities of Daily Living In your present state of health, do you have any difficulty performing the following activities?:  Driving? No Managing money?  No Feeding yourself? No Getting from bed to chair? No Climbing a flight of stairs? No Preparing food and eating?: No Bathing or showering? No Getting dressed: No Getting to the toilet? No Using the toilet:Yes Moving around from place to place: No In the past year have you fallen or had a near fall?:No   Are you sexually active?  No  Do you have more than one partner?  No  Hearing Difficulties: No Do you often ask people to speak up or repeat themselves? No Do you experience ringing or noises in your ears? No Do you have difficulty understanding soft or whispered voices? No   Do you feel that you have a problem with memory? Yes  Do you often misplace items? No  Do you feel safe at home?  Yes  Cognitive  Testing  Alert? Yes  Normal Appearance?Yes  Oriented to person? Yes  Place? Yes   Time? Yes  Recall of three objects?  Yes  Can perform simple calculations? Yes  Displays appropriate judgment?Yes  Can read the correct time from a watch face?Yes   Advanced Directives have been discussed with the patient?  Yes  List the Names of Other Physician/Practitioners you currently use: 1.    Indicate any recent Medical Services you may have received from other than [facility] providers in the past year (date may be approximate).  Immunization History  Administered Date(s) Administered  . Influenza Split 02/01/2011  . Influenza Whole 03/08/2007, 02/07/2008, 02/02/2009, 01/27/2010  . Pneumococcal Polysaccharide 05/08/2001, 08/19/2008  . Td 05/09/2003    Screening Tests Health Maintenance  Topic Date Due  . Zostavax  02/19/1987  . Colonoscopy  05/08/2010  . Influenza Vaccine  01/07/2012  . Tetanus/tdap  05/08/2013  . Pneumococcal Polysaccharide Vaccine Age 76 And Over  Completed    All answers were reviewed with the patient and necessary referrals were made:  Carrie Mew, MD   02/28/2012   History reviewed: allergies, current medications, past family history, past medical history, past social history, past surgical history and problem list  Review of Systems Genitourinary:positive for frequency and urinary incontinence    Objective:     Vision by Snellen chart: right eye:20/20, left eye:20/20 corrected  Body mass index is 20.01 kg/(m^2). BP 136/80  Pulse 72  Temp 98.3 F (36.8 C)  Resp 16  Ht 5\' 6"  (1.676 m)  Wt 124 lb (56.246 kg)  BMI 20.01 kg/m2  No exam performed today, .  exam was part of the problem focused exam   Assessment:      This is a routine physical examination for this healthy  Female. Reviewed all health maintenance protocols including mammography colonoscopy bone density and reviewed appropriate screening labs. Her immunization history was reviewed as well as her current medications and allergies refills of her chronic medications were given and the plan for yearly health maintenance was discussed all orders and referrals were made as appropriate.      Plan:     During the course of the visit the patient was educated and counseled about appropriate  screening and preventive services including:    Influenza vaccine  Td vaccine  Screening mammography  Diet review for nutrition referral? Yes ____  Not Indicated __x__   Patient Instructions (the written plan) was given to the patient.  Medicare Attestation I have personally reviewed: The patient's medical and social history Their use of alcohol, tobacco or illicit drugs Their current medications and supplements The patient's functional ability including ADLs,fall risks, home safety risks, cognitive, and hearing and visual impairment Diet and physical activities Evidence for depression or mood disorders  The patient's weight, height, BMI, and visual acuity have been recorded in the chart.  I have made referrals, counseling, and provided education to the patient based on review of the above and I have provided the patient with a written personalized care plan for preventive services.     Carrie Mew, MD   02/28/2012

## 2012-02-29 LAB — TSH: TSH: 0.59 u[IU]/mL (ref 0.35–5.50)

## 2012-03-01 ENCOUNTER — Other Ambulatory Visit: Payer: Self-pay | Admitting: Internal Medicine

## 2012-04-10 ENCOUNTER — Other Ambulatory Visit: Payer: Self-pay | Admitting: Internal Medicine

## 2012-04-11 ENCOUNTER — Other Ambulatory Visit: Payer: Self-pay | Admitting: *Deleted

## 2012-04-11 ENCOUNTER — Other Ambulatory Visit: Payer: Self-pay | Admitting: Internal Medicine

## 2012-04-11 ENCOUNTER — Telehealth: Payer: Self-pay | Admitting: Internal Medicine

## 2012-04-11 DIAGNOSIS — M810 Age-related osteoporosis without current pathological fracture: Secondary | ICD-10-CM

## 2012-04-11 MED ORDER — FLUTICASONE PROPIONATE 50 MCG/ACT NA SUSP: 2.0000 | Freq: Every day | NASAL | Status: DC

## 2012-04-11 NOTE — Telephone Encounter (Signed)
Pt called re: getting Reclast scheduled. Req call back from [place].

## 2012-04-12 ENCOUNTER — Other Ambulatory Visit (INDEPENDENT_AMBULATORY_CARE_PROVIDER_SITE_OTHER): Payer: Medicare Other

## 2012-04-12 DIAGNOSIS — Z7983 Long term (current) use of bisphosphonates: Secondary | ICD-10-CM

## 2012-04-12 DIAGNOSIS — Z5181 Encounter for therapeutic drug level monitoring: Secondary | ICD-10-CM

## 2012-04-12 LAB — BASIC METABOLIC PANEL
CO2: 29 mEq/L (ref 19–32)
Calcium: 9.1 mg/dL (ref 8.4–10.5)
GFR: 76.85 mL/min (ref >60.00)
Sodium: 139 mEq/L (ref 135–145)

## 2012-04-12 NOTE — Telephone Encounter (Signed)
Will come this pm for labs

## 2012-04-12 NOTE — Telephone Encounter (Signed)
Coming this pm for labs

## 2012-04-25 ENCOUNTER — Encounter (HOSPITAL_COMMUNITY): Payer: Self-pay

## 2012-04-25 ENCOUNTER — Ambulatory Visit (HOSPITAL_COMMUNITY)
Admission: RE | Admit: 2012-04-25 | Discharge: 2012-04-25 | Disposition: A | Discharge status: Home / Self Care | Payer: Medicare Other | Source: Ambulatory Visit | Attending: Internal Medicine | Admitting: Internal Medicine

## 2012-04-25 DIAGNOSIS — M810 Age-related osteoporosis without current pathological fracture: Secondary | ICD-10-CM | POA: Insufficient documentation

## 2012-04-25 MED ORDER — SODIUM CHLORIDE 0.9 % IV SOLN
Freq: Once | INTRAVENOUS | Status: AC
  Administered 2012-04-25: 250 mL via INTRAVENOUS

## 2012-04-25 MED ORDER — ZOLEDRONIC ACID 5 MG/100ML IV SOLN
5.0000 mg | Freq: Once | INTRAVENOUS | Status: AC
  Administered 2012-04-25: 5 mg via INTRAVENOUS
  Filled 2012-04-25: qty 100

## 2012-04-26 ENCOUNTER — Ambulatory Visit (HOSPITAL_COMMUNITY): Admission: RE | Admit: 2012-04-26 | Payer: Medicare Other | Source: Ambulatory Visit

## 2012-05-15 ENCOUNTER — Telehealth: Payer: Self-pay | Admitting: Internal Medicine

## 2012-05-15 MED ORDER — LORATADINE 10 MG PO TABS: 10.0000 mg | ORAL_TABLET | Freq: Every day | ORAL | Status: DC

## 2012-05-15 NOTE — Telephone Encounter (Signed)
Requesting refill of generic Claritin 10 mg # 90 with refills sent to [facility]

## 2012-05-15 NOTE — Telephone Encounter (Signed)
sent 

## 2012-05-17 ENCOUNTER — Other Ambulatory Visit: Payer: Self-pay | Admitting: *Deleted

## 2012-05-17 MED ORDER — LORATADINE 10 MG PO TABS: 10.0000 mg | ORAL_TABLET | Freq: Every day | ORAL | Status: DC

## 2012-06-26 ENCOUNTER — Emergency Department (HOSPITAL_COMMUNITY): Payer: Medicare Other

## 2012-06-26 ENCOUNTER — Inpatient Hospital Stay (HOSPITAL_COMMUNITY)
Admission: EM | Admit: 2012-06-26 | Discharge: 2012-07-01 | DRG: 470 | Disposition: A | Discharge status: Skilled Nursing Facility | Payer: Medicare Other | Attending: Internal Medicine | Admitting: Internal Medicine

## 2012-06-26 ENCOUNTER — Encounter (HOSPITAL_COMMUNITY): Payer: Self-pay | Admitting: Emergency Medicine

## 2012-06-26 ENCOUNTER — Telehealth: Payer: Self-pay | Admitting: Internal Medicine

## 2012-06-26 DIAGNOSIS — M810 Age-related osteoporosis without current pathological fracture: Secondary | ICD-10-CM | POA: Diagnosis present

## 2012-06-26 DIAGNOSIS — Z888 Allergy status to other drugs, medicaments and biological substances status: Secondary | ICD-10-CM

## 2012-06-26 DIAGNOSIS — S72002A Fracture of unspecified part of neck of left femur, initial encounter for closed fracture: Secondary | ICD-10-CM

## 2012-06-26 DIAGNOSIS — R928 Other abnormal and inconclusive findings on diagnostic imaging of breast: Secondary | ICD-10-CM

## 2012-06-26 DIAGNOSIS — B372 Candidiasis of skin and nail: Secondary | ICD-10-CM

## 2012-06-26 DIAGNOSIS — E876 Hypokalemia: Secondary | ICD-10-CM | POA: Diagnosis not present

## 2012-06-26 DIAGNOSIS — W010XXA Fall on same level from slipping, tripping and stumbling without subsequent striking against object, initial encounter: Secondary | ICD-10-CM | POA: Diagnosis present

## 2012-06-26 DIAGNOSIS — I10 Essential (primary) hypertension: Secondary | ICD-10-CM | POA: Diagnosis present

## 2012-06-26 DIAGNOSIS — N3281 Overactive bladder: Secondary | ICD-10-CM

## 2012-06-26 DIAGNOSIS — D638 Anemia in other chronic diseases classified elsewhere: Secondary | ICD-10-CM | POA: Diagnosis present

## 2012-06-26 DIAGNOSIS — Z79899 Other long term (current) drug therapy: Secondary | ICD-10-CM

## 2012-06-26 DIAGNOSIS — J309 Allergic rhinitis, unspecified: Secondary | ICD-10-CM

## 2012-06-26 DIAGNOSIS — D509 Iron deficiency anemia, unspecified: Secondary | ICD-10-CM

## 2012-06-26 DIAGNOSIS — Z96659 Presence of unspecified artificial knee joint: Secondary | ICD-10-CM

## 2012-06-26 DIAGNOSIS — S72033A Displaced midcervical fracture of unspecified femur, initial encounter for closed fracture: Principal | ICD-10-CM | POA: Diagnosis present

## 2012-06-26 DIAGNOSIS — R011 Cardiac murmur, unspecified: Secondary | ICD-10-CM | POA: Diagnosis present

## 2012-06-26 HISTORY — DX: Fracture of unspecified part of neck of left femur, initial encounter for closed fracture: S72.002A

## 2012-06-26 LAB — PROTIME-INR
INR: 1.09 (ref 0.00–1.49)
Prothrombin Time: 14 seconds (ref 11.6–15.2)

## 2012-06-26 LAB — BASIC METABOLIC PANEL
CO2: 25 mEq/L (ref 19–32)
Calcium: 9.4 mg/dL (ref 8.4–10.5)
Creatinine, Ser: 0.65 mg/dL (ref 0.50–1.10)
GFR calc non Af Amer: 79 mL/min — ABNORMAL LOW (ref >90)
Sodium: 140 mEq/L (ref 135–145)

## 2012-06-26 LAB — CBC WITH DIFFERENTIAL/PLATELET
Basophils Absolute: 0 10*3/uL (ref 0.0–0.1)
Eosinophils Absolute: 0 10*3/uL (ref 0.0–0.7)
Eosinophils Relative: 0 % (ref 0–5)
HCT: 34.6 % — ABNORMAL LOW (ref 36.0–46.0)
Lymphocytes Relative: 8 % — ABNORMAL LOW (ref 12–46)
MCH: 31.5 pg (ref 26.0–34.0)
MCHC: 32.9 g/dL (ref 30.0–36.0)
MCV: 95.6 fL (ref 78.0–100.0)
Monocytes Absolute: 0.8 10*3/uL (ref 0.1–1.0)
Platelets: 272 10*3/uL (ref 150–400)
RDW: 12.5 % (ref 11.5–15.5)
WBC: 8.3 10*3/uL (ref 4.0–10.5)

## 2012-06-26 LAB — SAMPLE TO BLOOD BANK

## 2012-06-26 LAB — TYPE AND SCREEN: Antibody Screen: NEGATIVE

## 2012-06-26 MED ORDER — AMLODIPINE BESYLATE 5 MG PO TABS
5.0000 mg | ORAL_TABLET | Freq: Every day | ORAL | Status: DC
  Administered 2012-06-27 – 2012-07-01 (×3): 5 mg via ORAL
  Filled 2012-06-26 (×5): qty 1

## 2012-06-26 MED ORDER — FOLIC ACID 1 MG PO TABS
1000.0000 ug | ORAL_TABLET | Freq: Every day | ORAL | Status: DC
  Administered 2012-06-27 – 2012-07-01 (×4): 1 mg via ORAL
  Filled 2012-06-26 (×5): qty 1

## 2012-06-26 MED ORDER — OXYBUTYNIN CHLORIDE 5 MG PO TABS
5.0000 mg | ORAL_TABLET | Freq: Two times a day (BID) | ORAL | Status: DC
Start: 2012-06-26 — End: 2012-07-01
  Administered 2012-06-27 – 2012-07-01 (×9): 5 mg via ORAL
  Filled 2012-06-26 (×11): qty 1

## 2012-06-26 MED ORDER — DEXTROSE-NACL 5-0.9 % IV SOLN
INTRAVENOUS | Status: DC
  Administered 2012-06-27 (×2): via INTRAVENOUS

## 2012-06-26 MED ORDER — ONDANSETRON HCL 4 MG PO TABS: 4.0000 mg | ORAL_TABLET | Freq: Four times a day (QID) | ORAL | Status: DC | PRN

## 2012-06-26 MED ORDER — ENOXAPARIN SODIUM 40 MG/0.4ML ~~LOC~~ SOLN
40.0000 mg | Freq: Every day | SUBCUTANEOUS | Status: DC
  Administered 2012-06-27: 40 mg via SUBCUTANEOUS
  Filled 2012-06-26 (×2): qty 0.4

## 2012-06-26 MED ORDER — ACETAMINOPHEN 325 MG PO TABS
650.0000 mg | ORAL_TABLET | Freq: Three times a day (TID) | ORAL | Status: DC | PRN
  Administered 2012-06-27: 650 mg via ORAL
  Administered 2012-06-27 – 2012-06-28 (×3): 325 mg via ORAL
  Filled 2012-06-26 (×2): qty 2
  Filled 2012-06-26 (×2): qty 1

## 2012-06-26 MED ORDER — SODIUM CHLORIDE 0.9 % IV SOLN
INTRAVENOUS | Status: DC
  Administered 2012-06-26: 19:00:00 via INTRAVENOUS

## 2012-06-26 MED ORDER — ONDANSETRON HCL 4 MG/2ML IJ SOLN: 4.0000 mg | Freq: Four times a day (QID) | INTRAMUSCULAR | Status: DC | PRN

## 2012-06-26 MED ORDER — ONDANSETRON HCL 4 MG/2ML IJ SOLN
4.0000 mg | INTRAMUSCULAR | Status: DC | PRN
  Filled 2012-06-26: qty 2

## 2012-06-26 MED ORDER — MORPHINE SULFATE 4 MG/ML IJ SOLN
4.0000 mg | INTRAMUSCULAR | Status: DC | PRN
  Filled 2012-06-26: qty 1

## 2012-06-26 MED ORDER — DOCUSATE SODIUM 100 MG PO CAPS
100.0000 mg | ORAL_CAPSULE | Freq: Two times a day (BID) | ORAL | Status: DC
  Administered 2012-06-27 – 2012-07-01 (×8): 100 mg via ORAL

## 2012-06-26 MED ORDER — MORPHINE SULFATE 2 MG/ML IJ SOLN
2.0000 mg | INTRAMUSCULAR | Status: DC | PRN
  Administered 2012-06-26 – 2012-06-28 (×2): 2 mg via INTRAVENOUS
  Filled 2012-06-26 (×2): qty 1

## 2012-06-26 NOTE — ED Notes (Addendum)
Pt here for c/o lt hip pain after falling 1 month ago denies loc tripped and fell.Pt stated that she unable to bear wt with her walker

## 2012-06-26 NOTE — ED Provider Notes (Signed)
History     CSN: [id number]  Arrival date & time 06/26/12  1726   First MD Initiated Contact with Patient 06/26/12 1756      Chief Complaint  Patient presents with  . Hip Pain     HPI Pt was seen at 1815.   Per pt, c/o gradual onset and worsening of persistence of left hip "pain" for the past 3 to 4 weeks. Pt states the pain began after she tripped and fell.  States she thinks she may have landed on her left hip when she fell.  States she has been walking with her walker since falling, but is now unable to walk due to increasing pain.  Denies hitting head, no LOC, no AMS, no CP/palpitations, no abd pain, no N/V/D, no focal motor weakness, no tingling/numbness in extremities, no saddle anesthesia, no incont/retention of bowel or bladder, no fevers, no new fall/injury.    Ortho: Dr. Despina Hick  Past Medical History  Diagnosis Date  . Allergy   . Anemia   . Hypertension   . Osteoporosis   . Nail fungus   . Rash     Past Surgical History  Procedure Laterality Date  . Total knee arthroplasty    . Replacement total knee    . Tonsillectomy    . Eye surgery      cataracts bilateral    Family History  Problem Relation Age of Onset  . Heart disease Mother     History  Substance Use Topics  . Smoking status: Never Smoker   . Smokeless tobacco: Not on file  . Alcohol Use: No    Review of Systems ROS: Statement: All systems negative except as marked or noted in the HPI; Constitutional: Negative for fever and chills. ; ; Eyes: Negative for eye pain, redness and discharge. ; ; ENMT: Negative for ear pain, hoarseness, nasal congestion, sinus pressure and sore throat. ; ; Cardiovascular: Negative for chest pain, palpitations, diaphoresis, dyspnea and peripheral edema. ; ; Respiratory: Negative for cough, wheezing and stridor. ; ; Gastrointestinal: Negative for nausea, vomiting, diarrhea, abdominal pain, blood in stool, hematemesis, jaundice and rectal bleeding. . ; ; Genitourinary:  Negative for dysuria, flank pain and hematuria. ; ; Musculoskeletal: +left hip pain. Negative for back pain and neck pain. Negative for swelling and deformity.; ; Skin: Negative for pruritus, rash, abrasions, blisters, bruising and skin lesion.; ; Neuro: Negative for headache, lightheadedness and neck stiffness. Negative for weakness, altered level of consciousness , altered mental status, extremity weakness, paresthesias, involuntary movement, seizure and syncope.       Allergies  Pneumococcal vaccine polyvalent and Ramipril  Home Medications   Current Outpatient Rx  Name  Route  Sig  Dispense  Refill  . acetaminophen (TYLENOL) 650 MG CR tablet   Oral   Take 650 mg by mouth every 8 (eight) hours as needed for pain.          Marland Kitchen amLODipine (NORVASC) 5 MG tablet   Oral   Take 5 mg by mouth daily.         Marland Kitchen amoxicillin (AMOXIL) 500 MG capsule   Oral   Take 2,000 mg by mouth as needed (for dental appt prophylaxis.).          Marland Kitchen calcium-vitamin D (OSCAL WITH D 500-200) 500-200 MG-UNIT per tablet   Oral   Take 1 tablet by mouth 2 (two) times daily.           . fluticasone (FLONASE) 50 MCG/ACT nasal  spray   Nasal   Place 2 sprays into the nose daily.   16 g   6   . folic acid (FOLVITE) 400 MCG tablet   Oral   Take 400 mcg by mouth daily.           Marland Kitchen loratadine (CLARITIN) 10 MG tablet   Oral   Take 1 tablet (10 mg total) by mouth daily.   90 tablet   3   . Misc Natural Products (OSTEO BI-FLEX ADV DOUBLE ST PO)   Oral   Take 1 tablet by mouth daily.          . Multiple Vitamin (MULTIVITAMIN WITH MINERALS) TABS   Oral   Take 1 tablet by mouth daily.         Marland Kitchen oxybutynin (DITROPAN) 5 MG tablet   Oral   Take 1 tablet (5 mg total) by mouth 2 (two) times daily.   60 tablet   11   . pyridoxine (B-6) 50 MG tablet   Oral   Take 50 mg by mouth daily.           . vitamin B-12 (CYANOCOBALAMIN) 250 MCG tablet   Oral   Take 250 mcg by mouth daily.            Marland Kitchen zinc gluconate 50 MG tablet   Oral   Take 50 mg by mouth daily.             BP 150/69  Pulse 93  Temp(Src) 98.2 F (36.8 C) (Oral)  Resp 20  SpO2 100%  Physical Exam 1820: Physical examination:  Nursing notes reviewed; Vital signs and O2 SAT reviewed;  Constitutional: Thin, frail, In no acute distress; Head:  Normocephalic, atraumatic; Eyes: EOMI, PERRL, No scleral icterus; ENMT: Mouth and pharynx normal, Mucous membranes dry; Neck: Supple, Full range of motion, No lymphadenopathy; Cardiovascular: Regular rate and rhythm, No murmur, rub, or gallop; Respiratory: Breath sounds clear & equal bilaterally, No rales, rhonchi, wheezes.  Speaking full sentences with ease, Normal respiratory effort/excursion; Chest: Nontender, Movement normal; Abdomen: Soft, Nontender, Nondistended, Normal bowel sounds; Genitourinary: No CVA tenderness; Spine:  No midline CS, TS, LS tenderness.;; Extremities: Pulses normal,+left lateral hip tenderness to palp. NT left knee/ankle/foot. +tr pedal edema bilat without calf edema or asymmetry.; Neuro: AA&Ox3, Major CN grossly intact.  Speech clear. No gross focal motor or sensory deficits in extremities.; Skin: Color normal, Warm, Dry.   ED Course  Procedures     MDM  MDM Reviewed: nursing note, vitals and previous chart Reviewed previous: labs and ECG Interpretation: x-ray, labs and ECG      Date: 06/26/2012  Rate: 89  Rhythm: normal sinus rhythm  QRS Axis: left  Intervals: normal  ST/T Wave abnormalities: normal  Conduction Disutrbances:left anterior fascicular block  Narrative Interpretation:   Old EKG Reviewed: unchanged; no significant changes from previous EKG dated 09/05/2006.  Results for orders placed during the hospital encounter of 06/26/12  CBC WITH DIFFERENTIAL      Result Value Range   WBC 8.3  4.0 - 10.5 K/uL   RBC 3.62 (*) 3.87 - 5.11 MIL/uL   Hemoglobin 11.4 (*) 12.0 - 15.0 g/dL   HCT 81.1 (*) 91.4 - 78.2 %   MCV 95.6  78.0 -  100.0 fL   MCH 31.5  26.0 - 34.0 pg   MCHC 32.9  30.0 - 36.0 g/dL   RDW 95.6  21.3 - 08.6 %   Platelets 272  150 - 400  K/uL   Neutrophils Relative 83 (*) 43 - 77 %   Neutro Abs 6.9  1.7 - 7.7 K/uL   Lymphocytes Relative 8 (*) 12 - 46 %   Lymphs Abs 0.6 (*) 0.7 - 4.0 K/uL   Monocytes Relative 9  3 - 12 %   Monocytes Absolute 0.8  0.1 - 1.0 K/uL   Eosinophils Relative 0  0 - 5 %   Eosinophils Absolute 0.0  0.0 - 0.7 K/uL   Basophils Relative 0  0 - 1 %   Basophils Absolute 0.0  0.0 - 0.1 K/uL  BASIC METABOLIC PANEL      Result Value Range   Sodium 140  135 - 145 mEq/L   Potassium 4.0  3.5 - 5.1 mEq/L   Chloride 101  96 - 112 mEq/L   CO2 25  19 - 32 mEq/L   Glucose, Bld 119 (*) 70 - 99 mg/dL   BUN 21  6 - 23 mg/dL   Creatinine, Ser 1.61  0.50 - 1.10 mg/dL   Calcium 9.4  8.4 - 09.6 mg/dL   GFR calc non Af Amer 79 (*) >90 mL/min   GFR calc Af Amer >90  >90 mL/min  PROTIME-INR      Result Value Range   Prothrombin Time 14.0  11.6 - 15.2 seconds   INR 1.09  0.00 - 1.49  APTT      Result Value Range   aPTT 41 (*) 24 - 37 seconds  TROPONIN I      Result Value Range   Troponin I <0.30  <0.30 ng/mL  SAMPLE TO BLOOD BANK      Result Value Range   Blood Bank Specimen SAMPLE AVAILABLE FOR TESTING     Sample Expiration 06/29/2012     Dg Lumbar Spine 2-3 Views 06/26/2012  *RADIOLOGY REPORT*  Clinical Data: Fall.  Low back pain.  LUMBAR SPINE - 2-3 VIEW  Comparison: Chest x-ray 08/24/2006.  Findings: Severe leftward curvature of lumbar spine is centered at L1.  There is asymmetric right-sided facet disease at the level of the curvature.  Compensatory rightward curvature is present in the lower lumbar spine with asymmetric left-sided facet disease.  The superior endplate compression fracture at L2 is age indeterminate. This was not present in 2008. Note is made of significant osteopenia.  IMPRESSION:  1.  Age indeterminate to superior endplate L2 compression fracture. 2.  Scoliosis. 3.   Osteopenia.   Original Report Authenticated By: Marin Roberts, M.D.    Dg Hip Complete Left 06/26/2012  *RADIOLOGY REPORT*  Clinical Data: Fall.  Left hip pain.  LEFT HIP - COMPLETE 2+ VIEW  Comparison: None.  Findings: The left subcapital femoral neck fracture is displaced superiorly approximately 10 mm.  Femoral head is located. Osteopenia is noted.  There is pelvic tilt with rightward curvature of the lower lumbar spine.  IMPRESSION:  1.  Displaced left subcapital femoral neck fracture. 2.  Osteopenia. 3.  Rightward curvature of the lower lumbar spine with significant pelvic tilt.   Original Report Authenticated By: Marin Roberts, M.D.    Dg Chest Port 1 View 06/26/2012  *RADIOLOGY REPORT*  Clinical Data: Preop for hip fracture fixation.  PORTABLE CHEST - 1 VIEW  Comparison: 08/24/2006.  Findings: The cardiac silhouette, mediastinal and hilar contours are within normal limits and stable.  There is tortuosity and calcification of the thoracic aorta.  There are chronic lung changes but no acute pulmonary findings.  The bony thorax is grossly intact.  IMPRESSION: No acute cardiopulmonary findings.  Chronic lung changes.   Original Report Authenticated By: Rudie Meyer, M.D.    Dg Knee Complete 4 Views Left 06/26/2012  *RADIOLOGY REPORT*  Clinical Data: Fall.  Left knee pain.  LEFT KNEE - COMPLETE 4+ VIEW  Comparison: None.  Findings: Osteopenia is noted.  The left knee is located. Tricompartment degenerative changes are evident.  No acute fracture or subluxation is present.  Vascular calcifications are noted.  IMPRESSION:  1.  Tricompartment old degenerative changes. 2.  No acute abnormality. 3.  Atherosclerosis.   Original Report Authenticated By: Marin Roberts, M.D.      2010:  Dx and testing d/w pt and family.  Questions answered.  Verb understanding, agreeable to admit. T/C to Ortho Dr. Lestine Box, case discussed, including:  HPI, pertinent PM/SHx, VS/PE, dx testing, ED course and  treatment:  Agreeable to consult, requests to admit to medicine service, likely Dr. Charlann Boxer will repair tomorrow.   2020:  T/C to [facility] Dr. Conley Rolls, case discussed, including:  HPI, pertinent PM/SHx, VS/PE, dx testing, ED course and treatment:  Agreeable to admit.       Laray Anger, DO 06/27/12 [phone]

## 2012-06-26 NOTE — ED Notes (Signed)
Hospitalist at bedside 

## 2012-06-26 NOTE — H&P (Signed)
[facility] History and Physical  Diane Little:[id number] DOB: 1925-03-26    PCP:   Carrie Mew, MD   Chief Complaint: mechanical fall with left hip fracture.  HPI: Diane Little is an 79 y.o. female with hx of osteoporosis, s/p total knee arthropathy, allergy, HTN, but no DM nor CAD, slipped and fell hurting her left hip.   She started to walk with her walker and was brought in to the ER by her hair dresser.  She denied LOC, CP, SOB, fever, chills, n/v or any other symptomology.  Evaluation in the ER showed EKG with Stach and no acute ST T changes, her Left hip x ray showed subcapital humeral neck fracture with superior displacement.  Her INR, Hb, and renal fx tests were normal.  Her CXR showed no acute infiltrate.  Ortho was consulted by the EDP and Dr Charlann Boxer will likely operate on her tomorrow.  Hospitalist was asked to admit her for the above reason.  Rewiew of Systems:  Constitutional: Negative for malaise, fever and chills. No significant weight loss or weight gain Eyes: Negative for eye pain, redness and discharge, diplopia, visual changes, or flashes of light. ENMT: Negative for ear pain, hoarseness, nasal congestion, sinus pressure and sore throat. No headaches; tinnitus, drooling, or problem swallowing. Cardiovascular: Negative for chest pain, palpitations, diaphoresis, dyspnea and peripheral edema. ; No orthopnea, PND Respiratory: Negative for cough, hemoptysis, wheezing and stridor. No pleuritic chestpain. Gastrointestinal: Negative for nausea, vomiting, diarrhea, constipation, abdominal pain, melena, blood in stool, hematemesis, jaundice and rectal bleeding.    Genitourinary: Negative for frequency, dysuria, incontinence,flank pain and hematuria; Musculoskeletal: Negative for back pain and neck pain. Negative for swelling and trauma.;  Skin: . Negative for pruritus, rash, abrasions, bruising and skin lesion.; ulcerations Neuro: Negative for headache,  lightheadedness and neck stiffness. Negative for weakness, altered level of consciousness , altered mental status, extremity weakness, burning feet, involuntary movement, seizure and syncope.  Psych: negative for anxiety, depression, insomnia, tearfulness, panic attacks, hallucinations, paranoia, suicidal or homicidal ideation    Past Medical History  Diagnosis Date  . Allergy   . Anemia   . Hypertension   . Osteoporosis   . Nail fungus   . Rash     Past Surgical History  Procedure Laterality Date  . Total knee arthroplasty    . Replacement total knee    . Tonsillectomy    . Eye surgery      cataracts bilateral    Medications:  HOME MEDS: Prior to Admission medications   Medication Sig Start Date End Date Taking? Authorizing Provider  acetaminophen (TYLENOL) 650 MG CR tablet Take 650 mg by mouth every 8 (eight) hours as needed for pain.    Yes Historical Provider, MD  amLODipine (NORVASC) 5 MG tablet Take 5 mg by mouth daily.   Yes Historical Provider, MD  amoxicillin (AMOXIL) 500 MG capsule Take 2,000 mg by mouth as needed (for dental appt prophylaxis.).    Yes Historical Provider, MD  calcium-vitamin D (OSCAL WITH D 500-200) 500-200 MG-UNIT per tablet Take 1 tablet by mouth 2 (two) times daily.     Yes Historical Provider, MD  fluticasone (FLONASE) 50 MCG/ACT nasal spray Place 2 sprays into the nose daily. 04/11/12  Yes Stacie Glaze, MD  folic acid (FOLVITE) 400 MCG tablet Take 400 mcg by mouth daily.     Yes Historical Provider, MD  loratadine (CLARITIN) 10 MG tablet Take 1 tablet (10 mg total) by mouth daily. 05/17/12  Yes Stacie Glaze, MD  Misc Natural Products (OSTEO BI-FLEX ADV DOUBLE ST PO) Take 1 tablet by mouth daily.    Yes Historical Provider, MD  Multiple Vitamin (MULTIVITAMIN WITH MINERALS) TABS Take 1 tablet by mouth daily.   Yes Historical Provider, MD  oxybutynin (DITROPAN) 5 MG tablet Take 1 tablet (5 mg total) by mouth 2 (two) times daily. 01/19/12  Yes Stacie Glaze, MD  pyridoxine (B-6) 50 MG tablet Take 50 mg by mouth daily.     Yes Historical Provider, MD  vitamin B-12 (CYANOCOBALAMIN) 250 MCG tablet Take 250 mcg by mouth daily.     Yes Historical Provider, MD  zinc gluconate 50 MG tablet Take 50 mg by mouth daily.     Yes Historical Provider, MD     Allergies:  Allergies  Allergen Reactions  . Fish Allergy Rash  . Pneumococcal Vaccine Polyvalent   . Ramipril     REACTION: rash    Social History:   reports that she has never smoked. She has never used smokeless tobacco. She reports that she does not drink alcohol or use illicit drugs.  Family History: Family History  Problem Relation Age of Onset  . Heart disease Mother      Physical Exam: Filed Vitals:   06/26/12 1828 06/26/12 1915  BP: 150/69 139/73  Pulse: 93 92  Temp: 98.2 F (36.8 C) 98.6 F (37 C)  TempSrc: Oral Oral  Resp: 20 17  SpO2: 100% 100%   Blood pressure 139/73, pulse 92, temperature 98.6 F (37 C), temperature source Oral, resp. rate 17, SpO2 100.00%.  GEN:  Pleasant  patient lying in the stretcher in no acute distress; cooperative with exam. PSYCH:  alert and oriented x4; does not appear anxious or depressed; affect is appropriate. HEENT: Mucous membranes pink and anicteric; PERRLA; EOM intact; no cervical lymphadenopathy nor thyromegaly or carotid bruit; no JVD; There were no stridor. Neck is very supple. Breasts:: Not examined CHEST WALL: No tenderness CHEST: Normal respiration, clear to auscultation bilaterally.  HEART: Regular rate and rhythm.  There is a II/6 SEM in the aortic area. BACK: No kyphosis or scoliosis; no CVA tenderness ABDOMEN: soft and non-tender; no masses, no organomegaly, normal abdominal bowel sounds; no pannus; no intertriginous candida. There is no rebound and no distention. Rectal Exam: Not done EXTREMITIES: No bone or joint deformity; age-appropriate arthropathy of the hands and knees; no edema; no ulcerations.  There is no  calf tenderness.  Left hip is tender with any movement of LL leg. Genitalia: not examined PULSES: 2+ and symmetric SKIN: Normal hydration no rash or ulceration CNS: Cranial nerves 2-12 grossly intact no focal lateralizing neurologic deficit.  Speech is fluent; uvula elevated with phonation, facial symmetry and tongue midline. DTR are normal bilaterally, cerebella exam is intact, barbinski is negative and strengths are equaled bilaterally.  No sensory loss.   Labs on Admission:  Basic Metabolic Panel:  Recent Labs Lab 06/26/12 1900  NA 140  K 4.0  CL 101  CO2 25  GLUCOSE 119*  BUN 21  CREATININE 0.65  CALCIUM 9.4   Liver Function Tests: No results found for this basename: AST, ALT, ALKPHOS, BILITOT, PROT, ALBUMIN,  in the last 168 hours No results found for this basename: LIPASE, AMYLASE,  in the last 168 hours No results found for this basename: AMMONIA,  in the last 168 hours CBC:  Recent Labs Lab 06/26/12 1900  WBC 8.3  NEUTROABS 6.9  HGB 11.4*  HCT 34.6*  MCV 95.6  PLT 272   Cardiac Enzymes:  Recent Labs Lab 06/26/12 1900  TROPONINI <0.30    CBG: No results found for this basename: GLUCAP,  in the last 168 hours   Radiological Exams on Admission: Dg Lumbar Spine 2-3 Views  06/26/2012  *RADIOLOGY REPORT*  Clinical Data: Fall.  Low back pain.  LUMBAR SPINE - 2-3 VIEW  Comparison: Chest x-ray 08/24/2006.  Findings: Severe leftward curvature of lumbar spine is centered at L1.  There is asymmetric right-sided facet disease at the level of the curvature.  Compensatory rightward curvature is present in the lower lumbar spine with asymmetric left-sided facet disease.  The superior endplate compression fracture at L2 is age indeterminate. This was not present in 2008. Note is made of significant osteopenia.  IMPRESSION:  1.  Age indeterminate to superior endplate L2 compression fracture. 2.  Scoliosis. 3.  Osteopenia.   Original Report Authenticated By: Marin Roberts, M.D.    Dg Hip Complete Left  06/26/2012  *RADIOLOGY REPORT*  Clinical Data: Fall.  Left hip pain.  LEFT HIP - COMPLETE 2+ VIEW  Comparison: None.  Findings: The left subcapital femoral neck fracture is displaced superiorly approximately 10 mm.  Femoral head is located. Osteopenia is noted.  There is pelvic tilt with rightward curvature of the lower lumbar spine.  IMPRESSION:  1.  Displaced left subcapital femoral neck fracture. 2.  Osteopenia. 3.  Rightward curvature of the lower lumbar spine with significant pelvic tilt.   Original Report Authenticated By: Marin Roberts, M.D.    Dg Chest Port 1 View  06/26/2012  *RADIOLOGY REPORT*  Clinical Data: Preop for hip fracture fixation.  PORTABLE CHEST - 1 VIEW  Comparison: 08/24/2006.  Findings: The cardiac silhouette, mediastinal and hilar contours are within normal limits and stable.  There is tortuosity and calcification of the thoracic aorta.  There are chronic lung changes but no acute pulmonary findings.  The bony thorax is grossly intact.  IMPRESSION: No acute cardiopulmonary findings.  Chronic lung changes.   Original Report Authenticated By: Rudie Meyer, M.D.    Dg Knee Complete 4 Views Left  06/26/2012  *RADIOLOGY REPORT*  Clinical Data: Fall.  Left knee pain.  LEFT KNEE - COMPLETE 4+ VIEW  Comparison: None.  Findings: Osteopenia is noted.  The left knee is located. Tricompartment degenerative changes are evident.  No acute fracture or subluxation is present.  Vascular calcifications are noted.  IMPRESSION:  1.  Tricompartment old degenerative changes. 2.  No acute abnormality. 3.  Atherosclerosis.   Original Report Authenticated By: Marin Roberts, M.D.     EKG: Independently reviewed.  ST without any acute ischemic changes.   Assessment/Plan Present on Admission:  . HYPERTENSION . OSTEOPOROSIS Left hip fracture. ++++ Heart murmur.++++  PLAN:  I will admit her for left hip fracture.  Ortho will see her tomorrow and it  will likely be Dr Charlann Boxer anticipating surgery tomorrow as well.    Accepting an increased cardiovascular risk during the perioperative period, she is clear for surgery as it is the best option of treatment for her.  She does have a murmur, and I will obtain an ECHO, but this shouldn't hold up her surgery.  Will continue her medication for her HTN.  She is stable, full code, and will be admitted to [facility] service.    Other plans as per orders.  Code Status: FULL Unk Lightning, MD. [facility] Pager [phone] 7pm to 7am.  06/26/2012,  therapeutic activity, neuromuscular re-education, manual therapy, passive range of motion, electrical stimulation, ultrasound, moist heat, cryotherapy, patient/family education, energy conservation, and DME and/or AE instructions  RECOMMENDED OTHER SERVICES: PT services  CONSULTED AND AGREED WITH PLAN OF CARE: Patient  PLAN FOR NEXT SESSION:  passive ROM and sustained holds working towards a full fist, LUE A/ROM, functional reaching, follow up on HEP and wrist pain, coordination tasks.      Clara Haynes, OTR/L [phone] 03/17/2022, 10:08 AM 

## 2012-06-26 NOTE — Telephone Encounter (Signed)
Patient Information:  Caller Name: Trinisha  Phone: [phone]  Patient: Diane Little  Gender: Female  DOB: 04/21/1925  Age: 79 Years  PCP: Darryll Capers (Adults only)  Office Follow Up:  Does the office need to follow up with this patient?: Yes  Instructions For The Office: See RN notes  RN Note:  Patient wants to know if she can come in this afternoon or early tomorrow morning instead of going to an Urgent Care Clinic or the Emergency Room. Please call son at the number provided regarding appointment time. Thanks.  Symptoms  Reason For Call & Symptoms: Left leg pain. Patient fell approximately 3-4 weeks ago. Patient is limping when she walks on the leg.  Reviewed Health History In EMR: Yes  Reviewed Medications In EMR: Yes  Reviewed Allergies In EMR: Yes  Reviewed Surgeries / Procedures: Yes  Date of Onset of Symptoms: 06/05/2012  Treatments Tried: Heating pads  Treatments Tried Worked: No  Guideline(s) Used:  Leg Injury  Disposition Per Guideline:   Go to ED Now (or to Office with PCP Approval)  Reason For Disposition Reached:   Severe pain (e.g. excruciating)  Advice Given:  N/A

## 2012-06-27 ENCOUNTER — Telehealth: Payer: Self-pay | Admitting: *Deleted

## 2012-06-27 DIAGNOSIS — I359 Nonrheumatic aortic valve disorder, unspecified: Secondary | ICD-10-CM

## 2012-06-27 MED ORDER — METHOCARBAMOL 500 MG PO TABS
500.0000 mg | ORAL_TABLET | Freq: Four times a day (QID) | ORAL | Status: DC | PRN
  Administered 2012-06-27 – 2012-06-30 (×4): 500 mg via ORAL
  Filled 2012-06-27 (×4): qty 1

## 2012-06-27 MED ORDER — METHOCARBAMOL 100 MG/ML IJ SOLN: 500.0000 mg | Freq: Four times a day (QID) | INTRAVENOUS | Status: DC | PRN

## 2012-06-27 NOTE — Progress Notes (Signed)
   Subjective: Displaced left subcapital femoral neck fracture  Patient reports pain as moderate with motion of the left hip, mild at rest. No events throughout the night. Discussion was had regarding surgery or not having surgery including the risks, benefits and expectations and she wishes to proceed with surgery.   Objective:   VITALS:   Filed Vitals:   06/27/12 0633  BP: 121/73  Pulse: 75  Temp: 99.5 F (37.5 C)  Resp: 18    Neurovascular intact Dorsiflexion/Plantar flexion intact No cellulitis present Compartment soft  LABS  Recent Labs  06/26/12 1900  HGB 11.4*  HCT 34.6*  WBC 8.3  PLT 272     Recent Labs  06/26/12 1900  NA 140  K 4.0  BUN 21  CREATININE 0.65  GLUCOSE 119*     Assessment/Plan: Displaced left subcapital femoral neck fracture  Regular diet now. NPO after midnight Plan is for surgery tomorrow afternoon Bed rest until that time.    Anastasio Auerbach Jayd Cadieux   PAC  06/27/2012, 9:51 AM

## 2012-06-27 NOTE — Progress Notes (Signed)
Patient ID: Diane Little, female   DOB: Aug 28, 1924, 79 y.o.   MRN: [id number]  [facility] PROGRESS NOTE  Atianna Haidar JYN:[id number] DOB: [birth year] DOA: 06/26/2012 PCP: Carrie Mew, MD  Brief narrative: Diane Little is an 79 y.o. female with hx of osteoporosis, s/p total knee arthropathy, allergy, HTN, but no DM nor CAD, slipped and fell hurting her left hip. She started to walk with her walker and was brought in to the ER by her hair dresser. She denied LOC, CP, SOB, fever, chills, n/v or any other symptomology. Evaluation in the ER showed EKG with Stach and no acute ST T changes, her Left hip x ray showed subcapital humeral neck fracture with superior displacement. Her INR, Hb, and renal fx tests were normal. Her CXR showed no acute infiltrate. Ortho was consulted by the EDP and Dr Charlann Boxer will likely operate on her tomorrow. Hospitalist was asked to admit her for the above reason.  Principal Problem:   Hip fracture, left - Displaced left subcapital femoral neck fracture.  - appreciate ortho input - surgery planned for 06/28/2012 - pt will need PT once able to participate post surgery  Active Problems:   HYPERTENSION - reasonable inpatient control - continue Norvasc   OSTEOPOROSIS - significant, analgesia as needed   TOTAL KNEE REPLACEMENT, RIGHT, HX OF - stable  Consultants:  Ortho  Procedures/Studies: Dg Lumbar Spine 2-3 Views 06/26/2012   1.  Age indeterminate to superior endplate L2 compression fracture.  2.  Scoliosis.  3.  Osteopenia.    Dg Hip Complete Left 06/26/2012  1.  Displaced left subcapital femoral neck fracture.  2.  Osteopenia.  3.  Rightward curvature of the lower lumbar spine with significant pelvic tilt.    Dg Chest Port 1 View 06/26/2012  No acute cardiopulmonary findings.  Chronic lung changes.     Dg Knee Complete 4 Views Left 06/26/2012   1.  Tricompartment old degenerative changes.  2.  No acute abnormality.  3.   Atherosclerosis.     Antibiotics:  None  Code Status: Full Family Communication: Pt at bedside Disposition Plan: PT evaluation post surgery   HPI/Subjective: No events overnight.   Objective: Filed Vitals:   06/26/12 2205 06/26/12 2245 06/26/12 2310 06/27/12 0633  BP: 125/64 184/82  121/73  Pulse: 80 90  75  Temp: 98.7 F (37.1 C) 99.6 F (37.6 C)  99.5 F (37.5 C)  TempSrc: Oral Oral  Oral  Resp: 17 20  18   Height:   5\' 5"  (1.651 m)   Weight:   110 lb 10.7 oz (50.2 kg)   SpO2: 100% 100%  98%   No intake or output data in the 24 hours ending 06/27/12 0941  Exam:   General:  Pt is alert, follows commands appropriately, not in acute distress  Cardiovascular: Regular rate and rhythm, S1/S2, no murmurs, no rubs, no gallops  Respiratory: Clear to auscultation bilaterally, no wheezing, no crackles, no rhonchi  Abdomen: Soft, non tender, non distended, bowel sounds present, no guarding  Extremities: No edema, pulses DP and PT palpable bilaterally  Neuro: Grossly nonfocal  Data Reviewed: Basic Metabolic Panel:  Recent Labs Lab 06/26/12 1900  NA 140  K 4.0  CL 101  CO2 25  GLUCOSE 119*  BUN 21  CREATININE 0.65  CALCIUM 9.4   CBC:  Recent Labs Lab 06/26/12 1900  WBC 8.3  NEUTROABS 6.9  HGB 11.4*  HCT 34.6*  MCV 95.6  PLT 272  Cardiac Enzymes:  Recent Labs Lab 06/26/12 1900  TROPONINI <0.30     Scheduled Meds: . amLODipine  5 mg Oral Daily  . docusate sodium  100 mg Oral BID  . enoxaparin (LOVENOX) injection  40 mg Subcutaneous QHS  . folic acid  1,000 mcg Oral Daily  . oxybutynin  5 mg Oral BID   Continuous Infusions: . sodium chloride 75 mL/hr at 06/26/12 1910  . dextrose 5 % and 0.9% NaCl 50 mL/hr at 06/27/12 Lynann Bologna, MD  [facility] Pager [phone]  If 7PM-7AM, please contact night-coverage [email or web address] Password [facility] 06/27/2012, 9:41 AM   LOS: 1 day

## 2012-06-27 NOTE — Telephone Encounter (Signed)
Admitted to er with fx hip

## 2012-06-27 NOTE — Progress Notes (Signed)
  Echocardiogram 2D Echocardiogram has been performed.  Cathie Beams 06/27/2012, 4:08 PM

## 2012-06-27 NOTE — Consult Note (Signed)
Reason for Consult:Left hip fracture Referring Physician: Houston Siren, MD   Diane Little is an 79 y.o. female.  ZOX:WRUEAVW reports fall almost one month ago otherwise no other injury. Pain in left hip began 1 week ago so she started using a walker to ambulate. Pain has intensified so she came to ER last night.  Patient denies any Chest pain SOB LOC or dizzinies at time of fall 1 month ago. Lives at home alone with family living next door to her. History of right total knee by Dr. Lequita Halt  In 2009 . Right TKA doing well.  Past Medical History  Diagnosis Date  . Allergy   . Anemia   . Hypertension   . Osteoporosis   . Nail fungus   . Rash     Past Surgical History  Procedure Laterality Date  . Total knee arthroplasty    . Replacement total knee    . Tonsillectomy    . Eye surgery      cataracts bilateral    Family History  Problem Relation Age of Onset  . Heart disease Mother     Social History:  reports that she has never smoked. She has never used smokeless tobacco. She reports that she does not drink alcohol or use illicit drugs.  Allergies:  Allergies  Allergen Reactions  . Fish Allergy Rash  . Pneumococcal Vaccine Polyvalent   . Ramipril     REACTION: rash    Medications: reviewed  Results for orders placed during the hospital encounter of 06/26/12 (from the past 48 hour(s))  CBC WITH DIFFERENTIAL     Status: Abnormal   Collection Time    06/26/12  7:00 PM      Result Value Range   WBC 8.3  4.0 - 10.5 K/uL   RBC 3.62 (*) 3.87 - 5.11 MIL/uL   Hemoglobin 11.4 (*) 12.0 - 15.0 g/dL   HCT 09.8 (*) 11.9 - 14.7 %   MCV 95.6  78.0 - 100.0 fL   MCH 31.5  26.0 - 34.0 pg   MCHC 32.9  30.0 - 36.0 g/dL   RDW 82.9  56.2 - 13.0 %   Platelets 272  150 - 400 K/uL   Neutrophils Relative 83 (*) 43 - 77 %   Neutro Abs 6.9  1.7 - 7.7 K/uL   Lymphocytes Relative 8 (*) 12 - 46 %   Lymphs Abs 0.6 (*) 0.7 - 4.0 K/uL   Monocytes Relative 9  3 - 12 %   Monocytes Absolute  0.8  0.1 - 1.0 K/uL   Eosinophils Relative 0  0 - 5 %   Eosinophils Absolute 0.0  0.0 - 0.7 K/uL   Basophils Relative 0  0 - 1 %   Basophils Absolute 0.0  0.0 - 0.1 K/uL  BASIC METABOLIC PANEL     Status: Abnormal   Collection Time    06/26/12  7:00 PM      Result Value Range   Sodium 140  135 - 145 mEq/L   Potassium 4.0  3.5 - 5.1 mEq/L   Chloride 101  96 - 112 mEq/L   CO2 25  19 - 32 mEq/L   Glucose, Bld 119 (*) 70 - 99 mg/dL   BUN 21  6 - 23 mg/dL   Creatinine, Ser 8.65  0.50 - 1.10 mg/dL   Calcium 9.4  8.4 - 78.4 mg/dL   GFR calc non Af Amer 79 (*) >90 mL/min   GFR calc Af Amer >  90  >90 mL/min   Comment:            The eGFR has been calculated     using the CKD EPI equation.     This calculation has not been     validated in all clinical     situations.     eGFR's persistently     <90 mL/min signify     possible Chronic Kidney Disease.  PROTIME-INR     Status: None   Collection Time    06/26/12  7:00 PM      Result Value Range   Prothrombin Time 14.0  11.6 - 15.2 seconds   INR 1.09  0.00 - 1.49  APTT     Status: Abnormal   Collection Time    06/26/12  7:00 PM      Result Value Range   aPTT 41 (*) 24 - 37 seconds   Comment:            IF BASELINE aPTT IS ELEVATED,     SUGGEST PATIENT RISK ASSESSMENT     BE USED TO DETERMINE APPROPRIATE     ANTICOAGULANT THERAPY.  TROPONIN I     Status: None   Collection Time    06/26/12  7:00 PM      Result Value Range   Troponin I <0.30  <0.30 ng/mL   Comment:            Due to the release kinetics of cTnI,     a negative result within the first hours     of the onset of symptoms does not rule out     myocardial infarction with certainty.     If myocardial infarction is still suspected,     repeat the test at appropriate intervals.  SAMPLE TO BLOOD BANK     Status: None   Collection Time    06/26/12  7:00 PM      Result Value Range   Blood Bank Specimen SAMPLE AVAILABLE FOR TESTING     Sample Expiration 06/29/2012     TYPE AND SCREEN     Status: None   Collection Time    06/26/12  7:00 PM      Result Value Range   ABO/RH(D) A NEG     Antibody Screen NEG     Sample Expiration 06/29/2012      Dg Lumbar Spine 2-3 Views  06/26/2012  *RADIOLOGY REPORT*  Clinical Data: Fall.  Low back pain.  LUMBAR SPINE - 2-3 VIEW  Comparison: Chest x-ray 08/24/2006.  Findings: Severe leftward curvature of lumbar spine is centered at L1.  There is asymmetric right-sided facet disease at the level of the curvature.  Compensatory rightward curvature is present in the lower lumbar spine with asymmetric left-sided facet disease.  The superior endplate compression fracture at L2 is age indeterminate. This was not present in 2008. Note is made of significant osteopenia.  IMPRESSION:  1.  Age indeterminate to superior endplate L2 compression fracture. 2.  Scoliosis. 3.  Osteopenia.   Original Report Authenticated By: Marin Roberts, M.D.    Dg Hip Complete Left  06/26/2012  *RADIOLOGY REPORT*  Clinical Data: Fall.  Left hip pain.  LEFT HIP - COMPLETE 2+ VIEW  Comparison: None.  Findings: The left subcapital femoral neck fracture is displaced superiorly approximately 10 mm.  Femoral head is located. Osteopenia is noted.  There is pelvic tilt with rightward curvature of the lower lumbar spine.  IMPRESSION:  1.  Displaced left subcapital femoral neck fracture. 2.  Osteopenia. 3.  Rightward curvature of the lower lumbar spine with significant pelvic tilt.   Original Report Authenticated By: Marin Roberts, M.D.    Dg Chest Port 1 View  06/26/2012  *RADIOLOGY REPORT*  Clinical Data: Preop for hip fracture fixation.  PORTABLE CHEST - 1 VIEW  Comparison: 08/24/2006.  Findings: The cardiac silhouette, mediastinal and hilar contours are within normal limits and stable.  There is tortuosity and calcification of the thoracic aorta.  There are chronic lung changes but no acute pulmonary findings.  The bony thorax is grossly intact.   IMPRESSION: No acute cardiopulmonary findings.  Chronic lung changes.   Original Report Authenticated By: Rudie Meyer, M.D.    Dg Knee Complete 4 Views Left  06/26/2012  *RADIOLOGY REPORT*  Clinical Data: Fall.  Left knee pain.  LEFT KNEE - COMPLETE 4+ VIEW  Comparison: None.  Findings: Osteopenia is noted.  The left knee is located. Tricompartment degenerative changes are evident.  No acute fracture or subluxation is present.  Vascular calcifications are noted.  IMPRESSION:  1.  Tricompartment old degenerative changes. 2.  No acute abnormality. 3.  Atherosclerosis.   Original Report Authenticated By: Marin Roberts, M.D.     Review of Systems  Musculoskeletal:       Left hip pain for 1week  All other systems reviewed and are negative.   Blood pressure 121/73, pulse 75, temperature 99.5 F (37.5 C), temperature source Oral, resp. rate 18, height 5\' 5"  (1.651 m), weight 50.2 kg (110 lb 10.7 oz), SpO2 98.00%. Physical Exam  Constitutional: She is oriented to person, place, and time. She appears well-developed and well-nourished.  HENT:  Head: Atraumatic.  Eyes: EOM are normal.  Cardiovascular: Normal rate and intact distal pulses.   Respiratory: Effort normal. No respiratory distress.  Musculoskeletal:  Left leg shortened and internal rotation. Left leg tenderness over left hip only. Right leg non tender  Non gross deformity. Dorsi/ plantar flexion bilateral ankles intact. EHL/FHL intact bilateral.  Neurological: She is alert and oriented to person, place, and time.  Sensation intact bilateral feet to light touch  Skin: Skin is warm and dry.  Psychiatric: She has a normal mood and affect.    Assessment/Plan: Left femoral neck fracture- plan hemiarthroplasty later today by Dr.Olin  NPO Dr. Charlann Boxer will take over care of patient from orthopaedic standpoint today   Richardean Canal 06/27/2012, 8:21 AM

## 2012-06-27 NOTE — Care Management Note (Addendum)
    Page 1 of 1   07/01/2012     1:50:01 PM   CARE MANAGEMENT NOTE 07/01/2012  Patient:  Diane Little, Diane Little   Account Number:  [id number]  Date Initiated:  06/27/2012  Documentation initiated by:  Colleen Can  Subjective/Objective Assessment:   dx FEMORALNECK FRACTURE WITH DISPLACEMNT     Action/Plan:   CM WILL F/U POST OP FOR NEEDS.   Anticipated DC Date:  06/20/2012   Anticipated DC Plan:  SKILLED NURSING FACILITY  In-house referral  Clinical Social Worker      DC Planning Services  CM consult      Choice offered to / List presented to:             Status of service:  Completed, signed off Medicare Important Message given?   (If response is "NO", the following Medicare IM given date fields will be blank) Date Medicare IM given:   Date Additional Medicare IM given:    Discharge Disposition:  SKILLED NURSING FACILITY  Per UR Regulation:  Reviewed for med. necessity/level of care/duration of stay  If discussed at Long Length of Stay Meetings, dates discussed:    Comments:  07/01/2012 Colleen Can BSN CCM RN [phone] Plans are for discharge to SNF today/CSW managing case     06/28/2012 Dory Peru RN CCM [phone] To OR for hemi-arthroplasty; pt/ot pending/CM will follow as needed.

## 2012-06-27 NOTE — Telephone Encounter (Signed)
Admitted with hip fracture

## 2012-06-28 ENCOUNTER — Encounter (HOSPITAL_COMMUNITY): Payer: Self-pay | Admitting: Certified Registered Nurse Anesthetist

## 2012-06-28 ENCOUNTER — Encounter (HOSPITAL_COMMUNITY)
Admission: EM | Disposition: A | Discharge status: Skilled Nursing Facility | Payer: Self-pay | Source: Home / Self Care | Attending: Internal Medicine

## 2012-06-28 ENCOUNTER — Encounter (HOSPITAL_COMMUNITY): Payer: Self-pay | Admitting: *Deleted

## 2012-06-28 ENCOUNTER — Inpatient Hospital Stay (HOSPITAL_COMMUNITY): Payer: Medicare Other

## 2012-06-28 ENCOUNTER — Inpatient Hospital Stay (HOSPITAL_COMMUNITY): Payer: Medicare Other | Admitting: Certified Registered Nurse Anesthetist

## 2012-06-28 HISTORY — PX: HIP ARTHROPLASTY: SHX981

## 2012-06-28 LAB — BASIC METABOLIC PANEL
BUN: 10 mg/dL (ref 6–23)
Chloride: 107 mEq/L (ref 96–112)
GFR calc Af Amer: >90 mL/min (ref >90)
Glucose, Bld: 104 mg/dL — ABNORMAL HIGH (ref 70–99)
Potassium: 3.3 mEq/L — ABNORMAL LOW (ref 3.5–5.1)

## 2012-06-28 LAB — CBC
HCT: 31.6 % — ABNORMAL LOW (ref 36.0–46.0)
Hemoglobin: 10.6 g/dL — ABNORMAL LOW (ref 12.0–15.0)
WBC: 8.2 10*3/uL (ref 4.0–10.5)

## 2012-06-28 SURGERY — HEMIARTHROPLASTY, HIP, DIRECT ANTERIOR APPROACH, FOR FRACTURE
Anesthesia: General | Site: Hip | Laterality: Left | Wound class: Clean

## 2012-06-28 MED ORDER — ENSURE COMPLETE PO LIQD
237.0000 mL | Freq: Two times a day (BID) | ORAL | Status: DC
  Administered 2012-06-29 – 2012-07-01 (×4): 237 mL via ORAL

## 2012-06-28 MED ORDER — METHOCARBAMOL 100 MG/ML IJ SOLN
500.0000 mg | Freq: Four times a day (QID) | INTRAVENOUS | Status: DC | PRN
  Administered 2012-06-28: 500 mg via INTRAVENOUS
  Filled 2012-06-28: qty 5

## 2012-06-28 MED ORDER — LIDOCAINE HCL (CARDIAC) 20 MG/ML IV SOLN
INTRAVENOUS | Status: DC | PRN
  Administered 2012-06-28: 50 mg via INTRAVENOUS

## 2012-06-28 MED ORDER — POTASSIUM CHLORIDE 10 MEQ/100ML IV SOLN
10.0000 meq | INTRAVENOUS | Status: AC
  Administered 2012-06-28 (×4): 10 meq via INTRAVENOUS
  Filled 2012-06-28 (×4): qty 100

## 2012-06-28 MED ORDER — FENTANYL CITRATE 0.05 MG/ML IJ SOLN: 25.0000 ug | INTRAMUSCULAR | Status: DC | PRN

## 2012-06-28 MED ORDER — SUCCINYLCHOLINE CHLORIDE 20 MG/ML IJ SOLN
INTRAMUSCULAR | Status: DC | PRN
  Administered 2012-06-28: 100 mg via INTRAVENOUS

## 2012-06-28 MED ORDER — ADULT MULTIVITAMIN W/MINERALS CH
1.0000 | ORAL_TABLET | Freq: Every day | ORAL | Status: DC
  Administered 2012-06-29 – 2012-07-01 (×4): 1 via ORAL
  Filled 2012-06-28 (×4): qty 1

## 2012-06-28 MED ORDER — MENTHOL 3 MG MT LOZG
1.0000 | LOZENGE | OROMUCOSAL | Status: DC | PRN
  Filled 2012-06-28: qty 9

## 2012-06-28 MED ORDER — METOCLOPRAMIDE HCL 10 MG PO TABS: 5.0000 mg | ORAL_TABLET | Freq: Three times a day (TID) | ORAL | Status: DC | PRN

## 2012-06-28 MED ORDER — DEXAMETHASONE SODIUM PHOSPHATE 10 MG/ML IJ SOLN
INTRAMUSCULAR | Status: DC | PRN
  Administered 2012-06-28: 10 mg via INTRAVENOUS

## 2012-06-28 MED ORDER — FLEET ENEMA 7-19 GM/118ML RE ENEM: 1.0000 | ENEMA | Freq: Once | RECTAL | Status: AC | PRN

## 2012-06-28 MED ORDER — PHENOL 1.4 % MT LIQD
1.0000 | OROMUCOSAL | Status: DC | PRN
  Filled 2012-06-28: qty 177

## 2012-06-28 MED ORDER — PROMETHAZINE HCL 25 MG/ML IJ SOLN: 6.2500 mg | INTRAMUSCULAR | Status: DC | PRN

## 2012-06-28 MED ORDER — TRAMADOL HCL 50 MG PO TABS
50.0000 mg | ORAL_TABLET | Freq: Four times a day (QID) | ORAL | Status: DC | PRN
Start: 2012-06-28 — End: 2012-07-01
  Administered 2012-06-29: 100 mg via ORAL
  Administered 2012-06-29 (×2): 50 mg via ORAL
  Filled 2012-06-28 (×4): qty 1

## 2012-06-28 MED ORDER — FERROUS SULFATE 325 (65 FE) MG PO TABS
325.0000 mg | ORAL_TABLET | Freq: Three times a day (TID) | ORAL | Status: DC
  Administered 2012-06-29 – 2012-07-01 (×8): 325 mg via ORAL
  Filled 2012-06-28 (×11): qty 1

## 2012-06-28 MED ORDER — LACTATED RINGERS IV SOLN
INTRAVENOUS | Status: DC | PRN
  Administered 2012-06-28 (×2): via INTRAVENOUS

## 2012-06-28 MED ORDER — POLYETHYLENE GLYCOL 3350 17 G PO PACK: 17.0000 g | PACK | Freq: Every day | ORAL | Status: DC | PRN

## 2012-06-28 MED ORDER — CEFAZOLIN SODIUM-DEXTROSE 2-3 GM-% IV SOLR
2.0000 g | Freq: Four times a day (QID) | INTRAVENOUS | Status: AC
  Administered 2012-06-28 – 2012-06-29 (×2): 2 g via INTRAVENOUS
  Filled 2012-06-28 (×2): qty 50

## 2012-06-28 MED ORDER — LACTATED RINGERS IV SOLN
INTRAVENOUS | Status: DC
  Administered 2012-06-29 – 2012-06-30 (×3): via INTRAVENOUS

## 2012-06-28 MED ORDER — ACETAMINOPHEN 650 MG RE SUPP: 650.0000 mg | Freq: Four times a day (QID) | RECTAL | Status: DC | PRN

## 2012-06-28 MED ORDER — ACETAMINOPHEN 325 MG PO TABS
650.0000 mg | ORAL_TABLET | Freq: Four times a day (QID) | ORAL | Status: DC | PRN
  Administered 2012-06-30 (×2): 650 mg via ORAL
  Filled 2012-06-28 (×2): qty 2

## 2012-06-28 MED ORDER — EPHEDRINE SULFATE 50 MG/ML IJ SOLN
INTRAMUSCULAR | Status: DC | PRN
  Administered 2012-06-28 (×2): 10 mg via INTRAVENOUS

## 2012-06-28 MED ORDER — 0.9 % SODIUM CHLORIDE (POUR BTL) OPTIME
TOPICAL | Status: DC | PRN
  Administered 2012-06-28: 1000 mL

## 2012-06-28 MED ORDER — LACTATED RINGERS IV SOLN
INTRAVENOUS | Status: DC
  Administered 2012-06-28: 1000 mL via INTRAVENOUS

## 2012-06-28 MED ORDER — METOCLOPRAMIDE HCL 5 MG/ML IJ SOLN: 5.0000 mg | Freq: Three times a day (TID) | INTRAMUSCULAR | Status: DC | PRN

## 2012-06-28 MED ORDER — ONDANSETRON HCL 4 MG/2ML IJ SOLN
INTRAMUSCULAR | Status: DC | PRN
  Administered 2012-06-28 (×2): 2 mg via INTRAVENOUS

## 2012-06-28 MED ORDER — PHENYLEPHRINE HCL 10 MG/ML IJ SOLN
INTRAMUSCULAR | Status: DC | PRN
  Administered 2012-06-28: 40 ug via INTRAVENOUS

## 2012-06-28 MED ORDER — FENTANYL CITRATE 0.05 MG/ML IJ SOLN
INTRAMUSCULAR | Status: DC | PRN
  Administered 2012-06-28 (×4): 50 ug via INTRAVENOUS
  Administered 2012-06-28: 100 ug via INTRAVENOUS
  Administered 2012-06-28: 50 ug via INTRAVENOUS

## 2012-06-28 MED ORDER — STERILE WATER FOR IRRIGATION IR SOLN
Status: DC | PRN
  Administered 2012-06-28: 1500 mL

## 2012-06-28 MED ORDER — CEFAZOLIN SODIUM-DEXTROSE 2-3 GM-% IV SOLR
INTRAVENOUS | Status: DC | PRN
  Administered 2012-06-28: 2 g via INTRAVENOUS

## 2012-06-28 MED ORDER — PROPOFOL 10 MG/ML IV EMUL
INTRAVENOUS | Status: DC | PRN
  Administered 2012-06-28: 150 mg via INTRAVENOUS

## 2012-06-28 MED ORDER — BISACODYL 10 MG RE SUPP: 10.0000 mg | Freq: Every day | RECTAL | Status: DC | PRN

## 2012-06-28 MED ORDER — ENOXAPARIN SODIUM 40 MG/0.4ML ~~LOC~~ SOLN
40.0000 mg | SUBCUTANEOUS | Status: DC
Start: 2012-06-29 — End: 2012-07-01
  Administered 2012-06-29 – 2012-07-01 (×3): 40 mg via SUBCUTANEOUS
  Filled 2012-06-28 (×4): qty 0.4

## 2012-06-28 SURGICAL SUPPLY — 53 items
ADH SKN CLS APL DERMABOND .7 (GAUZE/BANDAGES/DRESSINGS) ×1
BAG SPEC THK2 15X12 ZIP CLS (MISCELLANEOUS) ×1
BAG ZIPLOCK 12X15 (MISCELLANEOUS) ×2 IMPLANT
BLADE SAW SGTL 18X1.27X75 (BLADE) ×2 IMPLANT
CHLORAPREP W/TINT 26ML (MISCELLANEOUS) ×1 IMPLANT
CLOTH BEACON ORANGE TIMEOUT ST (SAFETY) ×2 IMPLANT
DERMABOND ADVANCED (GAUZE/BANDAGES/DRESSINGS) ×1
DERMABOND ADVANCED .7 DNX12 (GAUZE/BANDAGES/DRESSINGS) ×1 IMPLANT
DRAPE INCISE 23X17 IOBAN STRL (DRAPES) ×3
DRAPE INCISE 23X17 STRL (DRAPES) IMPLANT
DRAPE INCISE IOBAN 23X17 STRL (DRAPES) ×3 IMPLANT
DRAPE INCISE IOBAN 85X60 (DRAPES) ×1 IMPLANT
DRAPE ORTHO SPLIT 77X108 STRL (DRAPES) ×4
DRAPE POUCH INSTRU U-SHP 10X18 (DRAPES) ×2 IMPLANT
DRAPE SURG 17X11 SM STRL (DRAPES) ×2 IMPLANT
DRAPE SURG ORHT 6 SPLT 77X108 (DRAPES) ×2 IMPLANT
DRAPE U-SHAPE 47X51 STRL (DRAPES) ×2 IMPLANT
DRSG AQUACEL AG ADV 3.5X10 (GAUZE/BANDAGES/DRESSINGS) ×2 IMPLANT
DRSG TEGADERM 4X4.75 (GAUZE/BANDAGES/DRESSINGS) ×2 IMPLANT
DURAPREP 26ML APPLICATOR (WOUND CARE) ×1 IMPLANT
ELECT BLADE TIP CTD 4 INCH (ELECTRODE) ×2 IMPLANT
ELECT REM PT RETURN 9FT ADLT (ELECTROSURGICAL) ×2
ELECTRODE REM PT RTRN 9FT ADLT (ELECTROSURGICAL) ×1 IMPLANT
EVACUATOR 1/8 PVC DRAIN (DRAIN) ×1 IMPLANT
FACESHIELD LNG OPTICON STERILE (SAFETY) ×8 IMPLANT
GAUZE SPONGE 2X2 8PLY STRL LF (GAUZE/BANDAGES/DRESSINGS) ×1 IMPLANT
GLOVE BIOGEL PI IND STRL 7.5 (GLOVE) ×1 IMPLANT
GLOVE BIOGEL PI IND STRL 8 (GLOVE) ×1 IMPLANT
GLOVE BIOGEL PI INDICATOR 7.5 (GLOVE) ×1
GLOVE BIOGEL PI INDICATOR 8 (GLOVE) ×1
GLOVE ECLIPSE 8.0 STRL XLNG CF (GLOVE) IMPLANT
GLOVE ORTHO TXT STRL SZ7.5 (GLOVE) ×4 IMPLANT
GLOVE SURG ORTHO 8.0 STRL STRW (GLOVE) ×2 IMPLANT
GOWN BRE IMP PREV XXLGXLNG (GOWN DISPOSABLE) ×3 IMPLANT
GOWN STRL NON-REIN LRG LVL3 (GOWN DISPOSABLE) ×3 IMPLANT
HANDPIECE INTERPULSE COAX TIP (DISPOSABLE)
IMMOBILIZER KNEE 20 (SOFTGOODS)
IMMOBILIZER KNEE 20 THIGH 36 (SOFTGOODS) IMPLANT
KIT BASIN OR (CUSTOM PROCEDURE TRAY) ×2 IMPLANT
MANIFOLD NEPTUNE II (INSTRUMENTS) ×2 IMPLANT
PACK TOTAL JOINT (CUSTOM PROCEDURE TRAY) ×2 IMPLANT
POSITIONER SURGICAL ARM (MISCELLANEOUS) ×2 IMPLANT
SET HNDPC FAN SPRY TIP SCT (DISPOSABLE) IMPLANT
SPONGE GAUZE 2X2 STER 10/PKG (GAUZE/BANDAGES/DRESSINGS) ×1
STRIP CLOSURE SKIN 1/2X4 (GAUZE/BANDAGES/DRESSINGS) ×3 IMPLANT
SUT ETHIBOND NAB CT1 #1 30IN (SUTURE) ×1 IMPLANT
SUT MNCRL AB 4-0 PS2 18 (SUTURE) ×2 IMPLANT
SUT VIC AB 1 CT1 36 (SUTURE) ×3 IMPLANT
SUT VIC AB 2-0 CT1 27 (SUTURE) ×4
SUT VIC AB 2-0 CT1 TAPERPNT 27 (SUTURE) ×2 IMPLANT
SUT VLOC 180 0 24IN GS25 (SUTURE) ×1 IMPLANT
TOWEL OR 17X26 10 PK STRL BLUE (TOWEL DISPOSABLE) ×4 IMPLANT
TRAY FOLEY CATH 14FRSI W/METER (CATHETERS) ×1 IMPLANT

## 2012-06-28 NOTE — Progress Notes (Signed)
INITIAL NUTRITION ASSESSMENT  DOCUMENTATION CODES Per approved criteria  -Underweight -Non-severe malnutrition in the context of chronic illness   INTERVENTION:  Ensure Complete BID which will provide 700 kcal and 26 grams of protein when diet advanced  Multivitamin 1 tablet PO daily   RD to monitor intake  NUTRITION DIAGNOSIS: Increased protein needs related to recent hip fx and surgery as evidenced by surgery on 2/21.   Goal: Meet >/=90% estimated nutrition needs  Monitor:  Diet advancement from NPO status, tolerance of Ensure Complete, PO's, weight trends, labs  Reason for Assessment: Low BMI/underweight  79 y.o. female  Admitting Dx: Hip fracture, left  ASSESSMENT: Pt admitted with c/o hip pain x3-4 weeks and found to have a left hip fx per radiology report. Pt has a hx of osteoporosis. Surgery pending for later this afternoon.  Pt with hx of 20 pound weight loss in 6 months. Patient states that she has lost weight over the last few months. She does not weigh herself regularly but states that she believes her usual weight is around 140-145 pounds and that her pants have been extremely loose lately. Pt states that her appetite has been at baseline and that she was not eating less so she is unsure of why she experienced such extreme weight loss. She states that she usually eats two large meals/day and sometimes snacks between meals. Pt has lost 24 pounds (18% body weight) in the past year.  She states that she has not tried Ensure before but that she is interested in doing so to help her gain more weight and strength after surgery. Will provide this BID once diet is advanced from NPO. Pt states that she takes a multi-vitamin at home. Will order it for while she is here.  Pt meets criteria for non-severe malnutrition in the context of chronic illness as evidenced by mild/moderate muscle and fat wasting in upper arm, temple region, and clavicle bone region.  Nutrition Focused  Physical Exam:  Subcutaneous Fat:  Orbital Region: WNL Upper Arm Region: mild/moderate depletion Thoracic and Lumbar Region: n/a  Muscle:  Temple Region: mild/moderate depletion  Clavicle Bone Region: mild/moderate depletion Clavicle and Acromion Bone Region: WNL Scapular Bone Region: n/a Dorsal Hand: WNL Patellar Region: n/a Anterior Thigh Region: n/a Posterior Calf Region: n/a  Edema: none present, pt states she does not have a hx of edema    Height: Ht Readings from Last 1 Encounters:  06/26/12 5\' 5"  (1.651 m)    Weight: Wt Readings from Last 1 Encounters:  06/26/12 110 lb 10.7 oz (50.2 kg)    Ideal Body Weight: 125 lbs  % Ideal Body Weight: 88%  Wt Readings from Last 10 Encounters:  06/26/12 110 lb 10.7 oz (50.2 kg)  06/26/12 110 lb 10.7 oz (50.2 kg)  04/25/12 126 lb (57.153 kg)  02/28/12 124 lb (56.246 kg)  06/14/11 134 lb (60.782 kg)  04/11/11 136 lb (61.689 kg)  02/01/11 139 lb (63.05 kg)  07/22/10 130 lb (58.968 kg)  03/21/10 138 lb (62.596 kg)  11/22/09 143 lb (64.864 kg)    Usual Body Weight: 140-145, per pt report  % Usual Body Weight: 76-78.5%  BMI:  Body mass index is 18.42 kg/(m^2).--Underweight  Estimated Nutritional Needs: Kcal: 1400-1600 Protein: 75-85 grams Fluid: 1.4-1.6 L/day  Skin: intact, no wounds noted  Diet Order: NPO  EDUCATION NEEDS: -No education needs identified at this time   Intake/Output Summary (Last 24 hours) at 06/28/12 1310 Last data filed at 06/28/12 1012  Gross per 24 hour  Intake    890 ml  Output    500 ml  Net    390 ml    Last BM: PTA   Labs:   Recent Labs Lab 06/26/12 1900 06/28/12 0415  NA 140 139  K 4.0 3.3*  CL 101 107  CO2 25 26  BUN 21 10  CREATININE 0.65 0.49*  CALCIUM 9.4 8.1*  GLUCOSE 119* 104*    CBG (last 3)  No results found for this basename: GLUCAP,  in the last 72 hours  Scheduled Meds: . amLODipine  5 mg Oral Daily  . docusate sodium  100 mg Oral BID  . folic  acid  1,000 mcg Oral Daily  . oxybutynin  5 mg Oral BID  . potassium chloride  10 mEq Intravenous Q1 Hr x 4    Continuous Infusions: . sodium chloride 75 mL/hr at 06/26/12 1910  . dextrose 5 % and 0.9% NaCl 50 mL/hr at 06/27/12 1900    Past Medical History  Diagnosis Date  . Allergy   . Anemia   . Hypertension   . Osteoporosis   . Nail fungus   . Rash     Past Surgical History  Procedure Laterality Date  . Total knee arthroplasty    . Replacement total knee    . Tonsillectomy    . Eye surgery      cataracts bilateral    [facility] Dietetic Intern # [address] [place], [place], [place] [phone] Pager [phone] After Hours Pager

## 2012-06-28 NOTE — Anesthesia Postprocedure Evaluation (Signed)
Anesthesia Post Note  Patient: Diane Little  Procedure(s) Performed: Procedure(s) (LRB): Left HIP HEMI ARTHROPLASTY (Left)  Anesthesia type: General  Patient location: PACU  Post pain: Pain level controlled  Post assessment: Post-op Vital signs reviewed  Last Vitals:  Filed Vitals:   06/28/12 1658  BP: 152/71  Pulse: 97  Temp: 36.5 C  Resp: 16    Post vital signs: Reviewed  Level of consciousness: sedated  Complications: No apparent anesthesia complications

## 2012-06-28 NOTE — Progress Notes (Signed)
Patient ID: Diane Little, female   DOB: September 01, 1924, 79 y.o.   MRN: [id number]  [facility] PROGRESS NOTE  Diane Little WUJ:[id number] DOB: [birth year] DOA: 06/26/2012 PCP: Diane Mew, MD  Brief narrative:  Diane Little is an 79 y.o. female with hx of osteoporosis, s/p total knee arthropathy, allergy, HTN, but no DM nor CAD, slipped and fell hurting her left hip. She started to walk with her walker and was brought in to the ER by her hair dresser. She denied LOC, CP, SOB, fever, chills, n/v or any other symptomology. Evaluation in the ER showed EKG with Stach and no acute ST T changes, her Left hip x ray showed subcapital humeral neck fracture with superior displacement. Her INR, Hb, and renal fx tests were normal. Her CXR showed no acute infiltrate. Ortho was consulted by the EDP and Dr Charlann Boxer will likely operate on her tomorrow. Hospitalist was asked to admit her for the above reason.   Principal Problem:  Hip fracture, left  - Displaced left subcapital femoral neck fracture.  - appreciate ortho input  - surgery planned for 06/28/2012, left hip hemi arthroplasty  - pt will need PT once able to participate post surgery  Active Problems:  HYPERTENSION  - reasonable inpatient control  - continue Norvasc  OSTEOPOROSIS  - significant, analgesia as needed  HYPKALEMIA - will supplement via IV today ANEMIA OF CHRONIC DISEASE - slight drop in Hg since yesterday  - will monitor closely, CBC in AM TOTAL KNEE REPLACEMENT, RIGHT, HX OF  - stable   Consultants:  Ortho Procedures/Studies:  Dg Lumbar Spine 2-3 Views  06/26/2012  1. Age indeterminate to superior endplate L2 compression fracture.  2. Scoliosis.  3. Osteopenia.  Dg Hip Complete Left  06/26/2012  1. Displaced left subcapital femoral neck fracture.  2. Osteopenia.  3. Rightward curvature of the lower lumbar spine with significant pelvic tilt.  Dg Chest Port 1 View  06/26/2012  No acute  cardiopulmonary findings. Chronic lung changes.  Dg Knee Complete 4 Views Left  06/26/2012  1. Tricompartment old degenerative changes.  2. No acute abnormality.  3. Atherosclerosis.  Antibiotics:  None  Code Status: Full  Family Communication: Pt at bedside  Disposition Plan: PT evaluation post surgery   HPI/Subjective: No events overnight.   Objective: Filed Vitals:   06/27/12 0633 06/27/12 1345 06/27/12 1900 06/28/12 0529  BP: 121/73 104/55 164/79 125/75  Pulse: 75 78 87 85  Temp: 99.5 F (37.5 C) 99.1 F (37.3 C) 98.1 F (36.7 C) 99.3 F (37.4 C)  TempSrc: Oral  Oral Oral  Resp: 18 16 18 16   Height:      Weight:      SpO2: 98% 100% 98% 98%    Intake/Output Summary (Last 24 hours) at 06/28/12 0929 Last data filed at 06/28/12 0530  Gross per 24 hour  Intake 1759.17 ml  Output    650 ml  Net 1109.17 ml    Exam:   General:  Pt is alert, follows commands appropriately, not in acute distress  Cardiovascular: Regular rate and rhythm, S1/S2, no murmurs, no rubs, no gallops  Respiratory: Clear to auscultation bilaterally, no wheezing, no crackles, no rhonchi  Abdomen: Soft, non tender, non distended, bowel sounds present, no guarding  Extremities: No edema, pulses DP and PT palpable bilaterally  Neuro: Grossly nonfocal  Data Reviewed: Basic Metabolic Panel:  Recent Labs Lab 06/26/12 1900 06/28/12 0415  NA 140 139  K 4.0 3.3*  CL 101  107  CO2 25 26  GLUCOSE 119* 104*  BUN 21 10  CREATININE 0.65 0.49*  CALCIUM 9.4 8.1*   CBC:  Recent Labs Lab 06/26/12 1900 06/28/12 0415  WBC 8.3 8.2  NEUTROABS 6.9  --   HGB 11.4* 10.6*  HCT 34.6* 31.6*  MCV 95.6 96.6  PLT 272 227   Cardiac Enzymes:  Recent Labs Lab 06/26/12 1900  TROPONINI <0.30     Scheduled Meds: . amLODipine  5 mg Oral Daily  . docusate sodium  100 mg Oral BID  . folic acid  1,000 mcg Oral Daily  . oxybutynin  5 mg Oral BID   Continuous Infusions: . sodium chloride 75  mL/hr at 06/26/12 1910  . dextrose 5 % and 0.9% NaCl 50 mL/hr at 06/27/12 1900     Diane Presto, MD  [facility] Pager [phone]  If 7PM-7AM, please contact night-coverage [email or web address] Password [facility] 06/28/2012, 9:29 AM   LOS: 2 days

## 2012-06-28 NOTE — Progress Notes (Signed)
PACU Nursing Note: pt arrived into PACU, restless, attempting to get out of bed, pt disoriented x 4, pt refuses to lay on back for x-rays, MD notified of pt's current position and unable to position for post op x-rays, pt stated to not obtain post op x-rays today, will reorder for tomorrow am, pt will not answer questions or cooperate with staff, pulling off monitor device equipment and oxygen mask, becomes combative at times and only states "quit", attempting to reorient patient, ensuring safety measures are in place, RN remaining at beside to reassure patient and provide calm atmosphere, pt easily closes eyes and resting quietly, appears to be sleeping at times, will not answer when questioned about having post op pain, per face scale, no pain noted and on PAINAD, 0 total score noted.

## 2012-06-28 NOTE — Anesthesia Preprocedure Evaluation (Addendum)
Anesthesia Evaluation  Patient identified by MRN, date of birth, ID band Patient awake    Reviewed: Allergy & Precautions, H&P , NPO status , Patient's Chart, lab work & pertinent test results  Airway Mallampati: II TM Distance: >3 FB Neck ROM: Full    Dental  (+) Teeth Intact and Caps,    Pulmonary neg pulmonary ROS,  breath sounds clear to auscultation        Cardiovascular hypertension, Pt. on medications Rhythm:Regular Rate:Normal     Neuro/Psych  Neuromuscular disease negative psych ROS   GI/Hepatic negative GI ROS, Neg liver ROS,   Endo/Other  negative endocrine ROS  Renal/GU negative Renal ROS  negative genitourinary   Musculoskeletal negative musculoskeletal ROS (+)   Abdominal   Peds  Hematology negative hematology ROS (+)   Anesthesia Other Findings   Reproductive/Obstetrics negative OB ROS                          Anesthesia Physical Anesthesia Plan  ASA: II and emergent  Anesthesia Plan: General   Post-op Pain Management:    Induction: Intravenous  Airway Management Planned: Oral ETT  Additional Equipment:   Intra-op Plan:   Post-operative Plan: Extubation in OR  Informed Consent: I have reviewed the patients History and Physical, chart, labs and discussed the procedure including the risks, benefits and alternatives for the proposed anesthesia with the patient or authorized representative who has indicated his/her understanding and acceptance.   Dental advisory given  Plan Discussed with: CRNA  Anesthesia Plan Comments:         Anesthesia Quick Evaluation

## 2012-06-28 NOTE — Progress Notes (Signed)
Patient ID: Diane Little, female   DOB: 1925/02/28, 79 y.o.   MRN: [id number]   Left femoral neck fracture  No other complaints other than left hip pain  NPO Consent on chart  Had lengthy discussions with her daughter in law regarding care and plans Will plan to review actual procedure prior to operation today  To OR today for left hip hemi arthropalsty

## 2012-06-28 NOTE — Transfer of Care (Signed)
Immediate Anesthesia Transfer of Care Note  Patient: Diane Little  Procedure(s) Performed: Procedure(s): Left HIP HEMI ARTHROPLASTY (Left)  Patient Location: PACU  Anesthesia Type:General  Level of Consciousness: awake, pateint uncooperative, confused, lethargic and responds to stimulation  Airway & Oxygen Therapy: Patient Spontanous Breathing and Patient connected to face mask oxygen  Post-op Assessment: Report given to PACU RN, Post -op Vital signs reviewed and stable and Patient moving all extremities  Post vital signs: Reviewed and stable  Complications: No apparent anesthesia complications

## 2012-06-28 NOTE — Preoperative (Signed)
Beta Blockers   Reason not to administer Beta Blockers:Not Applicable 

## 2012-06-28 NOTE — Op Note (Signed)
NAME:  Diane Little                ACCOUNT NO.:  [id number]   MEDICAL RECORD NO.: [id number]   LOCATION:  1435                         FACILITY:  [facility]   DATE OF BIRTH:  Dec 03, 1924  PHYSICIAN:  Madlyn Frankel. Charlann Boxer, M.D.     DATE OF PROCEDURE:  06/28/2012                               OPERATIVE REPORT     PREOPERATIVE DIAGNOSIS:  Left displaced femoral neck fracture.   POSTOPERATIVE DIAGNOSIS:  Left displaced femoral neck fracture.   PROCEDURE:  Left hip hemiarthroplasty utilizing DePuy component, size 4 Hi Tri-Lock stem with a 46 unipolar ball with a +0 adapter.   SURGEON:  Madlyn Frankel. Charlann Boxer, MD   ASSISTANT:  Lanney Gins, PA-C. Present for case, general facilitation management of the operative extremity, primary wound closure   ANESTHESIA:  General.   SPECIMENS:  None.   DRAINS:  One medium Hemovac.   BLOOD LOSS:  About <200 cc.   COMPLICATIONS:  None.   INDICATION OF PROCEDURE:  Diane Little is a 79 year old female who lives Independently with family support.  She unfortunately had a fall at her house.  She was admitted to the hospital after radiographs revealed a femoral neck fracture.  She was seen and evaluated and was scheduled for surgery for fixation.  The necessity of surgical repair was discussed with she and her family.  I spoke at length with her daughter in law about her care and the planned procedure and expected post-operative course.  Consent was obtained after reviewing risks of infection, DVT, component failure, and need for revision surgery.   PROCEDURE IN DETAIL:  The patient was brought to the operative theater. Once adequate anesthesia, preoperative antibiotics, 2 g of Ancef administered, the patient was positioned into the right lateral decubitus position with the left side up.  The left lower extremity was then prepped and draped in sterile fashion.  A time-out was performed identifying the patient, planned procedure, and extremity.   A lateral  incision was made off the proximal trochanter. Sharp dissection was carried down to the iliotibial band and gluteal fascia. The gluteal fascia was then incised for posterior approach.  The short external rotators were taken down separate from the posterior capsule. An L capsulotomy was made preserving the posterior leaflet for later anatomic repair. Fracture site was identified and after removing comminuted segments of the posterior femoral neck, the femoral head was removed without difficulty and measured on the back table  using the sizing rings and determined to be 46 mm in diameter.   The proximal femur was then exposed.  Retractors placed.  I then drilled, opened the proximal femur.  Then I hand reamed once and  Irrigated the canal to try to prevent fat emboli.  I began broaching the femur with a starting broach up to a size 4 broach with good medial and lateral metaphyseal fit without evidence of any torsion or movement.  A trial reduction was carried out with a high offeset neck and a +0 adapter with a 46 ball.  The hip reduced nicely.  The leg lengths appeared to be equal compared to the down leg.   The hip went through a range  of motion without evidence of any subluxation or impingement.   Given these findings, the trial components removed.  The final 4 Hi  Tri-Lock stem was opened.  After irrigating the canal, the final stem was impacted and sat at the level where the broach was. Based on this and the trial reduction, a +0 adapter was opened and impacted in the 46 unipolar ball onto a clean and dry trunnion.  The hip had been irrigated throughout the case and again at this point.  I re- Approximated the posterior capsule to the superior leaflet using a  #1 Vicryl,  and placed a medium Hemovac drain deep.  The remainder of the wound was closed with #1 Vicryl in the iliotibial band and gluteal fascia, a  2-0 Vicryl in the sub-Q tissue and a running 4-0 Monocryl in the skin.   The hip was cleaned, dried, and dressed sterilely using Dermabond and Aquacel dressing.  Drain site was dressed separately.  She was then brought to recovery room, extubated in stable condition, tolerating the procedure well.  Lanney Gins, PA-C was present and utilized as Geophysicist/field seismologist for the entire case from  Preoperative positioning to management of the contralateral extremity and retractors to  General facilitation of the procedure.  He was also involved with primary wound closure.         Madlyn Frankel Charlann Boxer, M.D.

## 2012-06-29 LAB — BASIC METABOLIC PANEL
BUN: 9 mg/dL (ref 6–23)
CO2: 25 mEq/L (ref 19–32)
Chloride: 103 mEq/L (ref 96–112)
Creatinine, Ser: 0.51 mg/dL (ref 0.50–1.10)
GFR calc Af Amer: >90 mL/min (ref >90)
Potassium: 4 mEq/L (ref 3.5–5.1)

## 2012-06-29 LAB — CBC
HCT: 26.7 % — ABNORMAL LOW (ref 36.0–46.0)
MCV: 94 fL (ref 78.0–100.0)
RBC: 2.84 MIL/uL — ABNORMAL LOW (ref 3.87–5.11)
RDW: 12.5 % (ref 11.5–15.5)
WBC: 9.7 10*3/uL (ref 4.0–10.5)

## 2012-06-29 NOTE — Evaluation (Signed)
Physical Therapy Evaluation Patient Details Name: Diane Little MRN: [id number] DOB: 04-Oct-1924 Today's Date: 06/29/2012 Time: 1015-1050 PT Time Calculation (min): 35 min  PT Assessment / Plan / Recommendation Clinical Impression  Pt s/p L hip fx with hemiarthroplasty presents with decreased L LE strength/ROM. post THP and post op pain limiting functional mobiltiy    PT Assessment  Patient needs continued PT services    Follow Up Recommendations  SNF    Does the patient have the potential to tolerate intense rehabilitation      Barriers to Discharge Decreased caregiver support      Equipment Recommendations  None recommended by PT    Recommendations for Other Services OT consult   Frequency Min 4X/week    Precautions / Restrictions Precautions Precautions: Posterior Hip Precaution Comments: sign hung in room Restrictions Weight Bearing Restrictions: No Other Position/Activity Restrictions: WBAT   Pertinent Vitals/Pain       Mobility  Bed Mobility Bed Mobility: Supine to Sit Supine to Sit: 3: Mod assist Details for Bed Mobility Assistance: cues for sequence, use of R LE to self assist and adherence to THP Transfers Transfers: Sit to Stand;Stand to Sit Sit to Stand: 1: +2 Total assist;From bed;With upper extremity assist Sit to Stand: Patient Percentage: 60% Stand to Sit: 1: +2 Total assist;To chair/3-in-1;With armrests;With upper extremity assist Stand to Sit: Patient Percentage: 60% Details for Transfer Assistance: cues for LE management and use of UEs to self assist Ambulation/Gait Ambulation/Gait Assistance: 1: +2 Total assist Ambulation/Gait: Patient Percentage: 60% Ambulation Distance (Feet): 5 Feet Assistive device: Rolling walker Ambulation/Gait Assistance Details: cues for sequence, posture, position from RW and ER on L Gait Pattern: Step-to pattern;Decreased step length - right;Decreased step length - left;Trunk flexed    Exercises Total Joint  Exercises Ankle Circles/Pumps: AROM;10 reps;Supine;Both Quad Sets: AROM;Both;10 reps;Supine Heel Slides: AAROM;15 reps;Supine;Left Hip ABduction/ADduction: AAROM;Left;15 reps;Supine   PT Diagnosis: Difficulty walking  PT Problem List: Decreased strength;Decreased range of motion;Decreased activity tolerance;Decreased mobility;Decreased knowledge of use of DME;Pain;Decreased knowledge of precautions PT Treatment Interventions: DME instruction;Gait training;Stair training;Functional mobility training;Therapeutic activities;Therapeutic exercise;Patient/family education   PT Goals Acute Rehab PT Goals PT Goal Formulation: With patient Time For Goal Achievement: 07/04/12 Potential to Achieve Goals: Fair Pt will go Supine/Side to Sit: with min assist PT Goal: Supine/Side to Sit - Progress: Goal set today Pt will go Sit to Supine/Side: with min assist PT Goal: Sit to Supine/Side - Progress: Goal set today Pt will go Sit to Stand: with min assist PT Goal: Sit to Stand - Progress: Goal set today Pt will go Stand to Sit: with min assist PT Goal: Stand to Sit - Progress: Goal set today Pt will Ambulate: 51 - 150 feet;with min assist;with rolling walker PT Goal: Ambulate - Progress: Goal set today  Visit Information  Last PT Received On: 06/29/12 Assistance Needed: +2    Subjective Data  Subjective: Have you seen a mirror - I need to put my lipstick on Patient Stated Goal: walk   Prior Functioning  Home Living Lives With: Alone Prior Function Level of Independence: Independent Able to Take Stairs?: Yes Vocation: Retired Musician: No difficulties Dominant Hand: Left    Cognition  Cognition Overall Cognitive Status: Appears within functional limits for tasks assessed/performed Arousal/Alertness: Awake/alert Orientation Level: Appears intact for tasks assessed Behavior During Session: [facility] for tasks performed    Extremity/Trunk Assessment Right Upper Extremity  Assessment RUE ROM/Strength/Tone: [facility] for tasks assessed Left Upper Extremity Assessment LUE ROM/Strength/Tone: [facility] for  tasks assessed Right Lower Extremity Assessment RLE ROM/Strength/Tone: [facility] for tasks assessed Left Lower Extremity Assessment LLE ROM/Strength/Tone: Deficits LLE ROM/Strength/Tone Deficits: hip strength 3-/5 with AAROM at hip to 85 flex and 25 abd Trunk Assessment Trunk Assessment: Kyphotic   Balance    End of Session PT - End of Session Equipment Utilized During Treatment: Gait belt Activity Tolerance: Patient limited by fatigue;Patient tolerated treatment well Patient left: in chair;with call bell/phone within reach Nurse Communication: Mobility status  GP     Mattia Liford 06/29/2012, 1:56 PM

## 2012-06-29 NOTE — Progress Notes (Signed)
Patient ID: Diane Little, female   DOB: 1924-09-18, 79 y.o.   MRN: [id number]  [facility] PROGRESS NOTE  Jylian Pappalardo NWG:[id number] DOB: [birth year] DOA: 06/26/2012 PCP: Carrie Mew, MD  Brief narrative:  Diane Little is an 79 y.o. female with hx of osteoporosis, s/p total knee arthropathy, allergy, HTN, but no DM nor CAD, slipped and fell hurting her left hip. She started to walk with her walker and was brought in to the ER by her hair dresser. She denied LOC, CP, SOB, fever, chills, n/v or any other symptomology. Evaluation in the ER showed EKG with Stach and no acute ST T changes, her Left hip x ray showed subcapital humeral neck fracture with superior displacement. Her INR, Hb, and renal fx tests were normal. Her CXR showed no acute infiltrate. Ortho was consulted by the EDP and Dr Charlann Boxer will likely operate on her tomorrow. Hospitalist was asked to admit her for the above reason.  Principal Problem:  Hip fracture, left  - Displaced left subcapital femoral neck fracture.  - appreciate ortho input  - status post left hip hemiarthroplasty 06/28/2012 - pt will need PT  Active Problems:  HYPERTENSION  - reasonable inpatient control  - continue Norvasc  OSTEOPOROSIS  - significant, analgesia as needed  HYPKALEMIA - supplemented and within normal limits this AM ANEMIA OF CHRONIC DISEASE - slight drop in Hg since yesterday  - will monitor closely, CBC in AM  TOTAL KNEE REPLACEMENT, RIGHT, HX OF  - stable   Consultants:  Ortho Procedures/Studies:  Dg Lumbar Spine 2-3 Views  06/26/2012  1. Age indeterminate to superior endplate L2 compression fracture.  2. Scoliosis.  3. Osteopenia.  Dg Hip Complete Left  06/26/2012  1. Displaced left subcapital femoral neck fracture.  2. Osteopenia.  3. Rightward curvature of the lower lumbar spine with significant pelvic tilt.  Dg Chest Port 1 View  06/26/2012  No acute cardiopulmonary findings. Chronic lung  changes.  Dg Knee Complete 4 Views Left  06/26/2012  1. Tricompartment old degenerative changes.  2. No acute abnormality.  3. Atherosclerosis.  Antibiotics:  None  Code Status: Full  Family Communication: Pt at bedside  Disposition Plan: PT evaluation post surgery   HPI/Subjective: No events overnight.   Objective: Filed Vitals:   06/29/12 0000 06/29/12 0217 06/29/12 0339 06/29/12 0554  BP:  119/76  117/69  Pulse:  82  78  Temp:  98.4 F (36.9 C)  98.3 F (36.8 C)  TempSrc:  Oral  Oral  Resp: 16 16 16 14   Height:      Weight:      SpO2: 100% 99% 99% 100%    Intake/Output Summary (Last 24 hours) at 06/29/12 0747 Last data filed at 06/29/12 0734  Gross per 24 hour  Intake 2831.67 ml  Output   1165 ml  Net 1666.67 ml    Exam:   General:  Pt is alert, follows commands appropriately, not in acute distress  Cardiovascular: Regular rate and rhythm, S1/S2, no murmurs, no rubs, no gallops  Respiratory: Clear to auscultation bilaterally, no wheezing, no crackles, no rhonchi  Abdomen: Soft, non tender, non distended, bowel sounds present, no guarding  Extremities: No edema, pulses DP and PT palpable bilaterally  Neuro: Grossly nonfocal  Data Reviewed: Basic Metabolic Panel:  Recent Labs Lab 06/26/12 1900 06/28/12 0415 06/29/12 0458  NA 140 139 135  K 4.0 3.3* 4.0  CL 101 107 103  CO2 25 26 25   GLUCOSE 119*  104* 137*  BUN 21 10 9   CREATININE 0.65 0.49* 0.51  CALCIUM 9.4 8.1* 7.7*   Liver Function Tests: No results found for this basename: AST, ALT, ALKPHOS, BILITOT, PROT, ALBUMIN,  in the last 168 hours No results found for this basename: LIPASE, AMYLASE,  in the last 168 hours No results found for this basename: AMMONIA,  in the last 168 hours CBC:  Recent Labs Lab 06/26/12 1900 06/28/12 0415 06/29/12 0458  WBC 8.3 8.2 9.7  NEUTROABS 6.9  --   --   HGB 11.4* 10.6* 9.1*  HCT 34.6* 31.6* 26.7*  MCV 95.6 96.6 94.0  PLT 272 227 201   Cardiac  Enzymes:  Recent Labs Lab 06/26/12 1900  TROPONINI <0.30   BNP: No components found with this basename: POCBNP,  CBG: No results found for this basename: GLUCAP,  in the last 168 hours  No results found for this or any previous visit (from the past 240 hour(s)).   Scheduled Meds: . amLODipine  5 mg Oral Daily  . docusate sodium  100 mg Oral BID  . enoxaparin (LOVENOX) injection  40 mg Subcutaneous Q24H  . feeding supplement  237 mL Oral BID BM  . ferrous sulfate  325 mg Oral TID PC  . folic acid  1,000 mcg Oral Daily  . multivitamin with minerals  1 tablet Oral Daily  . oxybutynin  5 mg Oral BID   Continuous Infusions: . sodium chloride 75 mL/hr at 06/26/12 1910  . dextrose 5 % and 0.9% NaCl 50 mL/hr at 06/27/12 1900  . lactated ringers 125 mL/hr at 06/29/12 1610     Debbora Presto, MD  [facility] Pager [phone]  If 7PM-7AM, please contact night-coverage [email or web address] Password [facility] 06/29/2012, 7:47 AM   LOS: 3 days     n

## 2012-06-29 NOTE — Progress Notes (Signed)
Physical Therapy Treatment Patient Details Name: Treesa Mccully MRN: [id number] DOB: February 10, 1925 Today's Date: 06/29/2012 Time: 4098-1191 PT Time Calculation (min): 1346 min  PT Assessment / Plan / Recommendation Comments on Treatment Session  POD # 1 L hemiarthroplasy 2nd fall/fx WBAT.  Pt demon decreased safety cognition and decreased memory recall in reguards to her THP. Educated using "teach back" method however pt still unable to recall on her own.  Pt declined going back to bed so amb in hallway + 2 assist.  Pt plans to D/C to SNF.    Follow Up Recommendations  SNF     Does the patient have the potential to tolerate intense rehabilitation     Barriers to Discharge        Equipment Recommendations  None recommended by PT    Recommendations for Other Services    Frequency Min 4X/week   Plan      Precautions / Restrictions   L THP WBAT HIGH FALL RISK Decreased safety cognition   Pertinent Vitals/Pain C/o "some" pain with act Unable to rate ICE applied    Mobility  Ambulation/Gait Ambulation/Gait Assistance: 1: +2 Total assist Ambulation/Gait: Patient Percentage: 60% Ambulation Distance (Feet): 24 Feet Assistive device: Rolling walker Ambulation/Gait Assistance Details: Max support at first to initiate sequencing as pt demon posterior lean and unsteadyness.  Pt required 2 assist on both sides and 3rd assist following with chair.  Very unsteady gait also requiring assist with advancing RW correctly. Gait Pattern: Step-to pattern;Decreased step length - right;Decreased step length - left;Trunk flexed Gait velocity: decreased     PT Goals                                                    progressing    Visit Information  Last PT Received On: 06/29/12 Assistance Needed: +2    Subjective Data      Cognition    impaired/pleasant   Balance   poor  End of Session PT - End of Session Equipment Utilized During Treatment: Gait belt Activity Tolerance:  Patient limited by fatigue;Patient tolerated treatment well Patient left: in chair;with call bell/phone within reach Nurse Communication: Mobility status   Felecia Shelling  PTA [facility]  Acute  Rehab Pager      [phone]

## 2012-06-29 NOTE — Progress Notes (Signed)
Subjective: 1 Day Post-Op Procedure(s) (LRB): Left HIP HEMI ARTHROPLASTY (Left) Patient reports pain as 3 on 0-10 scale.  No major issues today.Wound looks fine..She pulled off her dressing. New dressing applied. Hip is Well-located clinically.Hbg 9.1.  Objective: Vital signs in last 24 hours: Temp:  [97.7 F (36.5 C)-99.1 F (37.3 C)] 98.3 F (36.8 C) (02/22 0554) Pulse Rate:  [78-97] 78 (02/22 0554) Resp:  [12-18] 14 (02/22 0554) BP: (115-155)/(63-84) 117/69 mmHg (02/22 0554) SpO2:  [97 %-100 %] 100 % (02/22 0554)  Intake/Output from previous day: 02/21 0701 - 02/22 0700 In: 2831.7 [P.O.:240; I.V.:2591.7] Out: 1090 [Urine:890; Blood:200] Intake/Output this shift: Total I/O In: -  Out: 75 [Urine:75]   Recent Labs  06/26/12 1900 06/28/12 0415 06/29/12 0458  HGB 11.4* 10.6* 9.1*    Recent Labs  06/28/12 0415 06/29/12 0458  WBC 8.2 9.7  RBC 3.27* 2.84*  HCT 31.6* 26.7*  PLT 227 201    Recent Labs  06/28/12 0415 06/29/12 0458  NA 139 135  K 3.3* 4.0  CL 107 103  CO2 26 25  BUN 10 9  CREATININE 0.49* 0.51  GLUCOSE 104* 137*  CALCIUM 8.1* 7.7*    Recent Labs  06/26/12 1900  INR 1.09    Dorsiflexion/Plantar flexion intact No cellulitis present  Assessment/Plan: 1 Day Post-Op Procedure(s) (LRB): Left HIP HEMI ARTHROPLASTY (Left) Up with therapy  Diane Little A 06/29/2012, 8:35 AM

## 2012-06-30 LAB — CBC
Platelets: 249 10*3/uL (ref 150–400)
RBC: 3.24 MIL/uL — ABNORMAL LOW (ref 3.87–5.11)
RDW: 12.5 % (ref 11.5–15.5)
WBC: 10.4 10*3/uL (ref 4.0–10.5)

## 2012-06-30 LAB — BASIC METABOLIC PANEL
CO2: 26 mEq/L (ref 19–32)
Chloride: 99 mEq/L (ref 96–112)
GFR calc Af Amer: >90 mL/min (ref >90)
Sodium: 133 mEq/L — ABNORMAL LOW (ref 135–145)

## 2012-06-30 MED ORDER — BISACODYL 10 MG RE SUPP: 10.0000 mg | Freq: Every day | RECTAL | Status: DC | PRN

## 2012-06-30 NOTE — Progress Notes (Signed)
Patient ID: Diane Little, female   DOB: 1925-01-11, 79 y.o.   MRN: [id number]  [facility] PROGRESS NOTE  Diane Little WNU:[id number] DOB: [birth year] DOA: 06/26/2012 PCP: Carrie Mew, MD  Brief narrative:  Diane Little is an 79 y.o. female with hx of osteoporosis, s/p total knee arthropathy, allergy, HTN, but no DM nor CAD, slipped and fell hurting her left hip. She started to walk with her walker and was brought in to the ER by her hair dresser. She denied LOC, CP, SOB, fever, chills, n/v or any other symptomology. Evaluation in the ER showed EKG with Stach and no acute ST T changes, her Left hip x ray showed subcapital humeral neck fracture with superior displacement. Her INR, Hb, and renal fx tests were normal. Her CXR showed no acute infiltrate. Ortho was consulted by the EDP and Dr Charlann Boxer will likely operate on her tomorrow. Hospitalist was asked to admit her for the above reason.   Principal Problem:  Hip fracture, left  - Displaced left subcapital femoral neck fracture.  - appreciate ortho input  - status post left hip hemiarthroplasty 06/28/2012  - doing well Active Problems:  HYPERTENSION  - reasonable inpatient control  - continue Norvasc  OSTEOPOROSIS  - significant, analgesia as needed  HYPKALEMIA - supplemented and within normal limits this AM  ANEMIA OF CHRONIC DISEASE - slight drop in Hg since yesterday  - will monitor closely, CBC in AM  TOTAL KNEE REPLACEMENT, RIGHT, HX OF  - stable   Consultants:  Ortho Procedures/Studies:  Dg Lumbar Spine 2-3 Views  06/26/2012  1. Age indeterminate to superior endplate L2 compression fracture.  2. Scoliosis.  3. Osteopenia.   Dg Hip Complete Left  06/26/2012  1. Displaced left subcapital femoral neck fracture.  2. Osteopenia.  3. Rightward curvature of the lower lumbar spine with significant pelvic tilt.   Dg Chest Port 1 View  06/26/2012  No acute cardiopulmonary findings. Chronic lung  changes.   Dg Knee Complete 4 Views Left  06/26/2012  1. Tricompartment old degenerative changes.  2. No acute abnormality.  3. Atherosclerosis.   Antibiotics:  None  Code Status: Full  Family Communication: Pt at bedside  Disposition Plan: SNF in am  HPI/Subjective: No events overnight.   Objective: Filed Vitals:   06/29/12 1120 06/29/12 1404 06/29/12 2155 06/30/12 0505  BP: 112/66 98/69 132/73 127/74  Pulse:  87 99 99  Temp:  97.8 F (36.6 C) 98.5 F (36.9 C) 99.7 F (37.6 C)  TempSrc:  Oral Oral Oral  Resp:  16 20 20   Height:      Weight:      SpO2:  97% 97% 95%    Intake/Output Summary (Last 24 hours) at 06/30/12 1001 Last data filed at 06/30/12 0347  Gross per 24 hour  Intake   2740 ml  Output    975 ml  Net   1765 ml    Exam:   General:  Pt is alert, follows commands appropriately, not in acute distress  Cardiovascular: Regular rate and rhythm, S1/S2, no murmurs, no rubs, no gallops  Respiratory: Clear to auscultation bilaterally, no wheezing, no crackles, no rhonchi  Abdomen: Soft, non tender, non distended, bowel sounds present, no guarding  Extremities: No edema, pulses DP and PT palpable bilaterally  Neuro: Grossly nonfocal  Data Reviewed: Basic Metabolic Panel:  Recent Labs Lab 06/26/12 1900 06/28/12 0415 06/29/12 0458 06/30/12 0458  NA 140 139 135 133*  K 4.0 3.3* 4.0  3.9  CL 101 107 103 99  CO2 25 26 25 26   GLUCOSE 119* 104* 137* 106*  BUN 21 10 9 13   CREATININE 0.65 0.49* 0.51 0.56  CALCIUM 9.4 8.1* 7.7* 8.5   CBC:  Recent Labs Lab 06/26/12 1900 06/28/12 0415 06/29/12 0458 06/30/12 0458  WBC 8.3 8.2 9.7 10.4  NEUTROABS 6.9  --   --   --   HGB 11.4* 10.6* 9.1* 10.2*  HCT 34.6* 31.6* 26.7* 30.8*  MCV 95.6 96.6 94.0 95.1  PLT 272 227 201 249   Cardiac Enzymes:  Recent Labs Lab 06/26/12 1900  TROPONINI <0.30   BNP:   Scheduled Meds: . amLODipine  5 mg Oral Daily  . docusate sodium  100 mg Oral BID  .  enoxaparin (LOVENOX) injection  40 mg Subcutaneous Q24H  . feeding supplement  237 mL Oral BID BM  . ferrous sulfate  325 mg Oral TID PC  . folic acid  1,000 mcg Oral Daily  . multivitamin with minerals  1 tablet Oral Daily  . oxybutynin  5 mg Oral BID   Continuous Infusions: . sodium chloride 75 mL/hr at 06/26/12 1910  . dextrose 5 % and 0.9% NaCl 50 mL/hr at 06/27/12 1900  . lactated ringers 125 mL/hr at 06/29/12 1358     Debbora Presto, MD  [facility] Pager [phone]  If 7PM-7AM, please contact night-coverage [email or web address] Password [email or web address] 06/30/2012, 10:01 AM   LOS: 4 days

## 2012-06-30 NOTE — Evaluation (Signed)
Occupational Therapy Evaluation Patient Details Name: Diane Little MRN: [id number] DOB: 1925-01-19 Today's Date: 06/30/2012 Time: 4098-1191 OT Time Calculation (min): 27 min  OT Assessment / Plan / Recommendation Clinical Impression  Pt s/p L hip fx with hemiarthroplasty. Pt with decreased I with ADL activity and will benefit from skilled OT to increase I with ADL activity and return to PLOF of I     OT Assessment  Patient needs continued OT Services    Follow Up Recommendations  SNF       Equipment Recommendations  None recommended by OT       Frequency  Min 2X/week    Precautions / Restrictions Precautions Precautions: Posterior Hip Precaution Comments: sign hung in room Restrictions Weight Bearing Restrictions: No Other Position/Activity Restrictions: WBAT       ADL  Grooming: Performed;Brushing hair Where Assessed - Grooming: Supported standing Upper Body Bathing: Simulated;Minimal assistance Where Assessed - Upper Body Bathing: Unsupported sitting Lower Body Bathing: Simulated;Moderate assistance Where Assessed - Lower Body Bathing: Supported sit to stand Upper Body Dressing: Simulated;Minimal assistance Where Assessed - Upper Body Dressing: Unsupported sitting Lower Body Dressing: Simulated;Moderate assistance Where Assessed - Lower Body Dressing: Supported sit to Pharmacist, hospital: Simulated;Moderate assistance Toilet Transfer Method: Sit to stand Toileting - Clothing Manipulation and Hygiene: Simulated;Minimal assistance Transfers/Ambulation Related to ADLs: Pt agreeable to ST SNF as she does live alone.  Son present for OT eval    OT Diagnosis: Generalized weakness  OT Problem List: Decreased strength OT Treatment Interventions: Self-care/ADL training;Patient/family education;DME and/or AE instruction   OT Goals Acute Rehab OT Goals Time For Goal Achievement: 06/30/12 ADL Goals Pt Will Perform Grooming: with supervision;Standing at sink ADL  Goal: Grooming - Progress: Goal set today Pt Will Perform Lower Body Dressing: with supervision;Sit to stand from chair;with adaptive equipment ADL Goal: Lower Body Dressing - Progress: Goal set today Pt Will Transfer to Toilet: with supervision;Comfort height toilet ADL Goal: Toilet Transfer - Progress: Goal set today Pt Will Perform Toileting - Clothing Manipulation: with supervision;Standing ADL Goal: Toileting - Clothing Manipulation - Progress: Goal set today  Visit Information  Last OT Received On: 06/30/12    Subjective Data  Subjective: I have been to [facility] before- i heard they had good therapy   Prior Functioning     Home Living Available Help at Discharge: Other (Comment) (son lives next door) Type of Home: House Home Access: Stairs to enter Secretary/administrator of Steps: 1 Home Layout: One level Firefighter: Standard Home Adaptive Equipment: Walker - rolling Prior Function Level of Independence: Independent         Vision/Perception Vision - History Baseline Vision: No visual deficits   Cognition  Cognition Overall Cognitive Status: Appears within functional limits for tasks assessed/performed Arousal/Alertness: Awake/alert Orientation Level: Appears intact for tasks assessed Behavior During Session: [facility] for tasks performed    Extremity/Trunk Assessment Right Upper Extremity Assessment RUE ROM/Strength/Tone: [facility] for tasks assessed Left Upper Extremity Assessment LUE ROM/Strength/Tone: WFL for tasks assessed Right Lower Extremity Assessment RLE ROM/Strength/Tone: [facility] for tasks assessed Left Lower Extremity Assessment LLE ROM/Strength/Tone: Deficits LLE ROM/Strength/Tone Deficits: hip strength 3-/5 with AAROM at hip to 85 flex and 25 abd Trunk Assessment Trunk Assessment: Kyphotic     Mobility Transfers Transfers: Sit to Stand;Stand to Sit Sit to Stand: 3: Mod assist;With upper extremity assist;From chair/3-in-1 Stand to Sit: 3: Mod  assist;With upper extremity assist;To chair/3-in-1           End of Session OT -  End of Session Activity Tolerance: Patient tolerated treatment well Patient left: in chair;with call bell/phone within reach;with family/visitor present       Danese Dorsainvil, [place] Kung 06/30/2012, 12:40 PM

## 2012-06-30 NOTE — Progress Notes (Signed)
Clinical Social Work Department BRIEF PSYCHOSOCIAL ASSESSMENT 06/30/2012  Patient:  Diane Little, Diane Little     Account Number:  [id number]     Admit date:  06/26/2012  Clinical Social Worker:  Candie Chroman  Date/Time:  06/30/2012 07:47 AM  Referred by:  Physician  Date Referred:  06/20/2012 Referred for  SNF Placement   Other Referral:   Interview type:  Patient Other interview type:    PSYCHOSOCIAL DATA Living Status:  ALONE Admitted from facility:   Level of care:   Primary support name:  Diane Little Primary support relationship to patient:  CHILD, ADULT Degree of support available:   supportive    CURRENT CONCERNS  Other Concerns:    SOCIAL WORK ASSESSMENT / PLAN Pt is an 79 yr old female living at home prior to hospitalization. CSW met with pt 2/ 20 + 2/21 to assist with d/c planning. ST  SNF is needed following hospital d/c. Pt / son are willing to consider ST SNF. SNF search was initiated 2/20 and bed offers to be provided this week end. CSW will continue to follow to assist with d/c planning needs.   Assessment/plan status:   Other assessment/ plan:   Information/referral to community resources:   SNF list provided.    PATIENT'S/FAMILY'S RESPONSE TO PLAN OF CARE: Unable to provide bed offer s2/21 due to pt in surgery and no family available. Week end CSW to assist. Pt has been in SNF in the past and was willing to consider ST Rehab again if needed.   Diane Razor LCSW [phone]

## 2012-06-30 NOTE — Progress Notes (Signed)
Clinical Social Work Department CLINICAL SOCIAL WORK PLACEMENT NOTE 06/30/2012  Patient:  LOLETA, FROMMELT  Account Number:  [id number] Admit date:  06/26/2012  Clinical Social Worker:  Cori Razor, LCSW  Date/time:  06/30/2012 08:00 AM  Clinical Social Work is seeking post-discharge placement for this patient at the following level of care:   SKILLED NURSING   (*CSW will update this form in Epic as items are completed)   06/27/2012  Patient/family provided with [facility] Department of Clinical Social Work's list of facilities offering this level of care within the geographic area requested by the patient (or if unable, by the patient's family).  06/27/2012  Patient/family informed of their freedom to choose among providers that offer the needed level of care, that participate in Medicare, Medicaid or managed care program needed by the patient, have an available bed and are willing to accept the patient.    Patient/family informed of [facility]' ownership interest in [facility], as well as of the fact that they are under no obligation to receive care at this facility.  PASARR submitted to EDS on 06/27/2012 PASARR number received from EDS on 06/27/2012  FL2 transmitted to all facilities in geographic area requested by pt/family on  06/27/2012 FL2 transmitted to all facilities within larger geographic area on   Patient informed that his/her managed care company has contracts with or will negotiate with  certain facilities, including the following:     Patient/family informed of bed offers received:   Patient chooses bed at  Physician recommends and patient chooses bed at    Patient to be transferred to  on   Patient to be transferred to facility by   The following physician request were entered in Epic:   Additional Comments:  Cori Razor LCSW [phone]

## 2012-06-30 NOTE — Progress Notes (Signed)
Subjective: 2 Days Post-Op Procedure(s) (LRB): Left HIP HEMI ARTHROPLASTY (Left) Patient reports pain as mild.   No c/o this morning.  Doing well with PT.  Objective: Vital signs in last 24 hours: Temp:  [97.8 F (36.6 C)-99.7 F (37.6 C)] 99.7 F (37.6 C) (02/23 0505) Pulse Rate:  [87-99] 99 (02/23 0505) Resp:  [16-20] 20 (02/23 0505) BP: (89-132)/(48-74) 127/74 mmHg (02/23 0505) SpO2:  [95 %-98 %] 95 % (02/23 0505)  Intake/Output from previous day: 02/22 0701 - 02/23 0700 In: 3480 [P.O.:480; I.V.:3000] Out: 800 [Urine:800] Intake/Output this shift: Total I/O In: -  Out: 350 [Urine:350]   Recent Labs  06/28/12 0415 06/29/12 0458 06/30/12 0458  HGB 10.6* 9.1* 10.2*    Recent Labs  06/29/12 0458 06/30/12 0458  WBC 9.7 10.4  RBC 2.84* 3.24*  HCT 26.7* 30.8*  PLT 201 249    Recent Labs  06/29/12 0458 06/30/12 0458  NA 135 133*  K 4.0 3.9  CL 103 99  CO2 25 26  BUN 9 13  CREATININE 0.51 0.56  GLUCOSE 137* 106*  CALCIUM 7.7* 8.5   No results found for this basename: LABPT, INR,  in the last 72 hours  L hip wound dressed and dry.  NVI L LE.  Assessment/Plan: 2 Days Post-Op Procedure(s) (LRB): Left HIP HEMI ARTHROPLASTY (Left) Up with therapy  WBAT on L LE.    Diane Little 06/30/2012, 9:00 AM

## 2012-07-01 ENCOUNTER — Encounter (HOSPITAL_COMMUNITY): Payer: Self-pay | Admitting: Orthopedic Surgery

## 2012-07-01 DIAGNOSIS — Z888 Allergy status to other drugs, medicaments and biological substances status: Secondary | ICD-10-CM

## 2012-07-01 MED ORDER — FERROUS SULFATE 325 (65 FE) MG PO TABS: 325.0000 mg | ORAL_TABLET | Freq: Every day | ORAL | Status: AC

## 2012-07-01 MED ORDER — TRAMADOL HCL 50 MG PO TABS: 50.0000 mg | ORAL_TABLET | Freq: Four times a day (QID) | ORAL | Status: DC | PRN

## 2012-07-01 MED ORDER — OXYCODONE-ACETAMINOPHEN 5-325 MG PO TABS: 1.0000 | ORAL_TABLET | ORAL | Status: DC | PRN

## 2012-07-01 MED ORDER — BISACODYL 10 MG RE SUPP: 10.0000 mg | Freq: Every day | RECTAL | Status: DC | PRN

## 2012-07-01 MED ORDER — POLYETHYLENE GLYCOL 3350 17 G PO PACK: 17.0000 g | PACK | Freq: Every day | ORAL | Status: DC | PRN

## 2012-07-01 MED ORDER — ENOXAPARIN SODIUM 40 MG/0.4ML ~~LOC~~ SOLN: 40.0000 mg | Freq: Every day | SUBCUTANEOUS | Status: DC

## 2012-07-01 NOTE — Progress Notes (Signed)
Occupational Therapy Treatment Patient Details Name: Diane Little MRN: [id number] DOB: Oct 06, 1924 Today's Date: 07/01/2012 Time: 2130-8657 OT Time Calculation (min): 23 min  OT Assessment / Plan / Recommendation Comments on Treatment Session OT helped pt order breakfast    Follow Up Recommendations  SNF       Equipment Recommendations  None recommended by OT       Frequency Min 2X/week   Plan Discharge plan remains appropriate    Precautions / Restrictions Precautions Precautions: Posterior Hip Precaution Comments: sign hung in room Restrictions Weight Bearing Restrictions: No Other Position/Activity Restrictions: WBAT       ADL  Grooming: Performed;Min guard Where Assessed - Grooming: Supported standing Toilet Transfer: Performed;Min guard Statistician Method: Sit to Barista: Materials engineer and Hygiene: Performed;Min guard Where Assessed - Engineer, mining and Hygiene: Standing Transfers/Ambulation Related to ADLs: Pt much improved this OT visit      OT Goals ADL Goals ADL Goal: Grooming - Progress: Progressing toward goals ADL Goal: Statistician - Progress: Progressing toward goals ADL Goal: Toileting - Clothing Manipulation - Progress: Progressing toward goals  Visit Information  Last OT Received On: 07/01/12    Subjective Data  Subjective: I have to go to bathroom quick         Mobility  Bed Mobility Bed Mobility: Supine to Sit Supine to Sit: 4: Min guard Transfers Transfers: Sit to Stand;Stand to Sit Sit to Stand: 4: Min guard;With upper extremity assist Stand to Sit: 4: Min guard;With upper extremity assist          End of Session OT - End of Session Activity Tolerance: Patient tolerated treatment well Patient left: in chair;with call bell/phone within reach  GO     [facility], Diane Little 07/01/2012, 10:21 AM

## 2012-07-01 NOTE — Discharge Summary (Addendum)
Physician Discharge Summary  Diane Little:[id number] DOB: Jun 24, 1924 DOA: 06/26/2012  PCP: Carrie Mew, MD  Admit date: 06/26/2012 Discharge date: 07/01/2012  Recommendations for Outpatient Follow-up:  1. Pt will need to follow up with PCP in 2-3 weeks post discharge 2. Please obtain BMP to evaluate electrolytes and kidney function 3. Please also check CBC to evaluate Hg and Hct levels 4. Pt will need to follow up with ortho team in 2-3 weeks for re evaluation  5. Please note that pt was discharged on Lovenox 40 mg SubQ daily injections for DVT prophylaxis   Discharge Diagnoses:  Principal Problem:   Hip fracture, left Active Problems:   HYPERTENSION   OSTEOPOROSIS   TOTAL KNEE REPLACEMENT, RIGHT, HX OF   Discharge Condition: Stable  Diet recommendation: Heart healthy diet discussed in details   Brief narrative:  Diane Little is an 79 y.o. female with hx of osteoporosis, s/p total knee arthropathy, allergy, HTN, but no DM nor CAD, slipped and fell hurting her left hip. She started to walk with her walker and was brought in to the ER by her hair dresser. She denied LOC, CP, SOB, fever, chills, n/v or any other symptomology. Evaluation in the ER showed EKG with Stach and no acute ST T changes, her Left hip x ray showed subcapital humeral neck fracture with superior displacement. Her INR, Hb, and renal fx tests were normal. Her CXR showed no acute infiltrate. Ortho was consulted by the EDP and Dr Charlann Boxer will likely operate on her tomorrow. Hospitalist was asked to admit her for the above reason.   Principal Problem:  Hip fracture, left  - Displaced left subcapital femoral neck fracture.  - appreciate ortho input  - status post left hip hemiarthroplasty 06/28/2012  - doing well and ready for discharge Active Problems:  HYPERTENSION  - reasonable inpatient control  - continue Norvasc  OSTEOPOROSIS  - significant, analgesia as needed  HYPKALEMIA -  supplemented and within normal limits this AM  ANEMIA OF CHRONIC DISEASE - slight drop in Hg since yesterday  - overall remains stable  TOTAL KNEE REPLACEMENT, RIGHT, HX OF  - stable   Consultants:  Ortho Procedures/Studies:  Dg Lumbar Spine 2-3 Views  06/26/2012  1. Age indeterminate to superior endplate L2 compression fracture.  2. Scoliosis.  3. Osteopenia.  Dg Hip Complete Left  06/26/2012  1. Displaced left subcapital femoral neck fracture.  2. Osteopenia.  3. Rightward curvature of the lower lumbar spine with significant pelvic tilt.  Dg Chest Port 1 View  06/26/2012  No acute cardiopulmonary findings. Chronic lung changes.  Dg Knee Complete 4 Views Left  06/26/2012  1. Tricompartment old degenerative changes.  2. No acute abnormality.  3. Atherosclerosis.   Antibiotics:  None  Code Status: Full   Discharge Exam: Filed Vitals:   07/01/12 0959  BP: 129/70  Pulse: 85  Temp: 98.8 F (37.1 C)  Resp: 16   Filed Vitals:   06/30/12 2030 07/01/12 0500 07/01/12 0800 07/01/12 0959  BP: 146/84 133/74  129/70  Pulse: 96 86  85  Temp: 98.1 F (36.7 C) 98.9 F (37.2 C)  98.8 F (37.1 C)  TempSrc: Oral Oral  Oral  Resp: 16 16 18 16   Height:      Weight:      SpO2: 97% 97%  98%    General: Pt is alert, follows commands appropriately, not in acute distress Cardiovascular: Regular rate and rhythm, S1/S2 +, no murmurs, no rubs,  no gallops Respiratory: Clear to auscultation bilaterally, no wheezing, no crackles, no rhonchi Abdominal: Soft, non tender, non distended, bowel sounds +, no guarding Extremities: no edema, no cyanosis, pulses palpable bilaterally DP and PT Neuro: Grossly nonfocal  Discharge Instructions  Discharge Orders   Future Appointments Provider Department Dept Phone   07/29/2012 3:15 PM Stacie Glaze, MD [facility] [phone]   Future Orders Complete By Expires     Diet - low sodium heart healthy  As directed     Increase  activity slowly  As directed         Medication List    TAKE these medications      Lovenox 40 mg sub Q daily for 14 days for DVT prophylaxis    acetaminophen 650 MG CR tablet  Commonly known as:  TYLENOL  Take 650 mg by mouth every 8 (eight) hours as needed for pain.     amLODipine 5 MG tablet  Commonly known as:  NORVASC  Take 5 mg by mouth daily.     amoxicillin 500 MG capsule  Commonly known as:  AMOXIL  Take 2,000 mg by mouth as needed (for dental appt prophylaxis.).     bisacodyl 10 MG suppository  Commonly known as:  DULCOLAX  Place 1 suppository (10 mg total) rectally daily as needed.     calcium-vitamin D 500-200 MG-UNIT per tablet  Commonly known as:  OSCAL WITH D  Take 1 tablet by mouth 2 (two) times daily.     ferrous sulfate 325 (65 FE) MG tablet  Take 1 tablet (325 mg total) by mouth daily with breakfast.     fluticasone 50 MCG/ACT nasal spray  Commonly known as:  FLONASE  Place 2 sprays into the nose daily.     folic acid 400 MCG tablet  Commonly known as:  FOLVITE  Take 400 mcg by mouth daily.     loratadine 10 MG tablet  Commonly known as:  CLARITIN  Take 1 tablet (10 mg total) by mouth daily.     multivitamin with minerals Tabs  Take 1 tablet by mouth daily.     OSTEO BI-FLEX ADV DOUBLE ST PO  Take 1 tablet by mouth daily.     oxybutynin 5 MG tablet  Commonly known as:  DITROPAN  Take 1 tablet (5 mg total) by mouth 2 (two) times daily.     oxyCODONE-acetaminophen 5-325 MG per tablet  Commonly known as:  ROXICET  Take 1 tablet by mouth every 4 (four) hours as needed for pain.     polyethylene glycol packet  Commonly known as:  MIRALAX / GLYCOLAX  Take 17 g by mouth daily as needed.     pyridOXINE 50 MG tablet  Commonly known as:  B-6  Take 50 mg by mouth daily.     traMADol 50 MG tablet  Commonly known as:  ULTRAM  Take 1-2 tablets (50-100 mg total) by mouth every 6 (six) hours as needed.     vitamin B-12 250 MCG tablet  Commonly  known as:  CYANOCOBALAMIN  Take 250 mcg by mouth daily.     zinc gluconate 50 MG tablet  Take 50 mg by mouth daily.           Follow-up Information   Follow up with Carrie Mew, MD In 2 weeks.   Contact information:   [address] [place]ena Flake [place] [address] [phone]        The results of significant diagnostics  from this hospitalization (including imaging, microbiology, ancillary and laboratory) are listed below for reference.     Microbiology: No results found for this or any previous visit (from the past 240 hour(s)).   Labs: Basic Metabolic Panel:  Recent Labs Lab 06/26/12 1900 06/28/12 0415 06/29/12 0458 06/30/12 0458  NA 140 139 135 133*  K 4.0 3.3* 4.0 3.9  CL 101 107 103 99  CO2 25 26 25 26   GLUCOSE 119* 104* 137* 106*  BUN 21 10 9 13   CREATININE 0.65 0.49* 0.51 0.56  CALCIUM 9.4 8.1* 7.7* 8.5  CBC:  Recent Labs Lab 06/26/12 1900 06/28/12 0415 06/29/12 0458 06/30/12 0458  WBC 8.3 8.2 9.7 10.4  NEUTROABS 6.9  --   --   --   HGB 11.4* 10.6* 9.1* 10.2*  HCT 34.6* 31.6* 26.7* 30.8*  MCV 95.6 96.6 94.0 95.1  PLT 272 227 201 249   Cardiac Enzymes:  Recent Labs Lab 06/26/12 1900  TROPONINI <0.30    SIGNED: Time coordinating discharge: Over 30 minutes  Debbora Presto, MD  [facility] 07/01/2012, 10:05 AM Pager [phone]  If 7PM-7AM, please contact night-coverage [email or web address] Password [email or web address]

## 2012-07-01 NOTE — Progress Notes (Signed)
Clinical Social Work Department CLINICAL SOCIAL WORK PLACEMENT NOTE 07/01/2012  Patient:  Diane Little, Diane Little  Account Number:  [id number] Admit date:  06/26/2012  Clinical Social Worker:  Cori Razor, LCSW  Date/time:  06/30/2012 08:00 AM  Clinical Social Work is seeking post-discharge placement for this patient at the following level of care:   SKILLED NURSING   (*CSW will update this form in Epic as items are completed)   06/27/2012  Patient/family provided with [facility] Department of Clinical Social Work's list of facilities offering this level of care within the geographic area requested by the patient (or if unable, by the patient's family).  06/27/2012  Patient/family informed of their freedom to choose among providers that offer the needed level of care, that participate in Medicare, Medicaid or managed care program needed by the patient, have an available bed and are willing to accept the patient.    Patient/family informed of [facility]' ownership interest in [facility], as well as of the fact that they are under no obligation to receive care at this facility.  PASARR submitted to EDS on 06/27/2012 PASARR number received from EDS on 06/27/2012  FL2 transmitted to all facilities in geographic area requested by pt/family on  06/27/2012 FL2 transmitted to all facilities within larger geographic area on   Patient informed that his/her managed care company has contracts with or will negotiate with  certain facilities, including the following:     Patient/family informed of bed offers received:  06/21/2012 Patient chooses bed at [facility] Physician recommends and patient chooses bed at    Patient to be transferred to [facility] on  07/01/2012 Patient to be transferred to facility by [facility]  The following physician request were entered in Epic:   Additional Comments:  Cori Razor LCSW [phone]

## 2012-07-01 NOTE — Progress Notes (Signed)
Physical Therapy Treatment Patient Details Name: Diane Little MRN: [id number] DOB: 11-16-1924 Today's Date: 07/01/2012 Time: 7829-5621 PT Time Calculation (min): 25 min  PT Assessment / Plan / Recommendation Comments on Treatment Session  Progressing with mobility. Pt continues to demonstrate impaired memory recall and requires MAX cueing for adherence to THP. Continue to recommend SNF.     Follow Up Recommendations  SNF     Does the patient have the potential to tolerate intense rehabilitation     Barriers to Discharge        Equipment Recommendations  None recommended by PT    Recommendations for Other Services    Frequency Min 4X/week   Plan Discharge plan remains appropriate    Precautions / Restrictions Precautions Precautions: Posterior Hip Precaution Comments: Reviewed hip precautions with pt. Pt unable to recall any precautions. Will need constant cueing/reminders.  Restrictions Weight Bearing Restrictions: No LLE Weight Bearing: Weight bearing as tolerated Other Position/Activity Restrictions: WBAT   Pertinent Vitals/Pain "not bad, a little bit" -pt unable to rate-L hip    Mobility  Bed Mobility Bed Mobility: Not assessed Supine to Sit: 4: Min guard Details for Bed Mobility Assistance: pt sitting in recliner Transfers Transfers: Sit to Stand;Stand to Sit Sit to Stand: 4: Min assist;From chair/3-in-1;From toilet Stand to Sit: 4: Min assist;To chair/3-in-1;To toilet Details for Transfer Assistance: VCs safety, technique, hand placement. Assist to rise, stabilize, control descent Ambulation/Gait Ambulation/Gait Assistance: 4: Min assist Ambulation Distance (Feet): 75 Feet Assistive device: Rolling walker Ambulation/Gait Assistance Details: VCS safety, distance from RW, posture, L LE positioning. Gait still somewhat unsteady but improved. Assist to stabilize and maneuver safely with RW.  Gait Pattern: Step-to pattern;Antalgic;Trunk flexed;Decreased stride  length;Decreased step length - left;Decreased step length - right    Exercises Total Joint Exercises Ankle Circles/Pumps: AROM;Both;10 reps;Seated Quad Sets: AROM;Both;10 reps;Seated Short Arc Quad: AROM;Left;10 reps;Seated Hip ABduction/ADduction: AAROM;Left;10 reps;Seated   PT Diagnosis:    PT Problem List:   PT Treatment Interventions:     PT Goals Acute Rehab PT Goals Pt will go Sit to Stand: with supervision PT Goal: Sit to Stand - Progress: Updated due to goal met Pt will go Stand to Sit: with supervision PT Goal: Stand to Sit - Progress: Updated due to goals met Pt will Ambulate: 51 - 150 feet;with supervision;with rolling walker PT Goal: Ambulate - Progress: Updated due to goal met  Visit Information  Last PT Received On: 07/01/12 Assistance Needed: +2 (safety)    Subjective Data  Subjective: I havent' seen a doctor since I've been here Patient Stated Goal: None stated   Cognition       Balance     End of Session PT - End of Session Equipment Utilized During Treatment: Gait belt Activity Tolerance: Patient tolerated treatment well Patient left: in chair;with call bell/phone within reach   GP     Rebeca Alert [facility] 07/01/2012, 12:05 PM [phone]

## 2012-07-01 NOTE — Progress Notes (Signed)
Pt to be d/c to [facility]. [facility] to transport. Pt in no distress and denies pain.

## 2012-07-01 NOTE — Progress Notes (Signed)
Patient ID: Diane Little, female   DOB: 1924/11/12, 79 y.o.   MRN: [id number] Subjective: 3 Days Post-Op Procedure(s) (LRB): Left HIP HEMI ARTHROPLASTY (Left)    Patient reports pain as mild.  Up walking with therapy this am.  Mentally clear yet still slightly disoriented, back to baseline  Objective:   VITALS:   Filed Vitals:   07/01/12 0959  BP: 129/70  Pulse: 85  Temp: 98.8 F (37.1 C)  Resp: 16    Neurovascular intact Incision: dressing C/D/I  LABS  Recent Labs  06/29/12 0458 06/30/12 0458  HGB 9.1* 10.2*  HCT 26.7* 30.8*  WBC 9.7 10.4  PLT 201 249     Recent Labs  06/29/12 0458 06/30/12 0458  NA 135 133*  K 4.0 3.9  BUN 9 13  CREATININE 0.51 0.56  GLUCOSE 137* 106*    No results found for this basename: LABPT, INR,  in the last 72 hours   Assessment/Plan: 3 Days Post-Op Procedure(s) (LRB): Left HIP HEMI ARTHROPLASTY (Left)   Up with therapy Discharge to SNF when ready  Discharge plan: [facility]LE Maintain surgical dressing until follow up appointment May shower with current dressing as is water proof RTC in 2 weeks 2 weeks of Lovenox for DVT prophylaxis

## 2012-07-29 ENCOUNTER — Ambulatory Visit (INDEPENDENT_AMBULATORY_CARE_PROVIDER_SITE_OTHER): Payer: Medicare Other | Admitting: Internal Medicine

## 2012-07-29 ENCOUNTER — Telehealth: Payer: Self-pay | Admitting: *Deleted

## 2012-07-29 ENCOUNTER — Encounter: Payer: Self-pay | Admitting: Internal Medicine

## 2012-07-29 VITALS — BP 120/66 | HR 76 | Temp 98.2°F | Resp 16 | Ht 65.0 in | Wt 108.0 lb

## 2012-07-29 DIAGNOSIS — I10 Essential (primary) hypertension: Secondary | ICD-10-CM

## 2012-07-29 DIAGNOSIS — Z96649 Presence of unspecified artificial hip joint: Secondary | ICD-10-CM

## 2012-07-29 DIAGNOSIS — E43 Unspecified severe protein-calorie malnutrition: Secondary | ICD-10-CM

## 2012-07-29 DIAGNOSIS — R634 Abnormal weight loss: Secondary | ICD-10-CM

## 2012-07-29 LAB — CBC WITH DIFFERENTIAL/PLATELET
Basophils Absolute: 0 10*3/uL (ref 0.0–0.1)
Eosinophils Absolute: 0 10*3/uL (ref 0.0–0.7)
Lymphocytes Relative: 13 % (ref 12.0–46.0)
MCHC: 34 g/dL (ref 30.0–36.0)
Monocytes Relative: 8.8 % (ref 3.0–12.0)
Platelets: 207 10*3/uL (ref 150.0–400.0)
RDW: 13.9 % (ref 11.5–14.6)

## 2012-07-29 LAB — T4, FREE: Free T4: 1.31 ng/dL (ref 0.60–1.60)

## 2012-07-29 LAB — TSH: TSH: 0.68 u[IU]/mL (ref 0.35–5.50)

## 2012-07-29 NOTE — Progress Notes (Signed)
  Subjective:    Patient ID: Diane Little, female    DOB: 1924-09-22, 79 y.o.   MRN: [id number]  HPI  Patient had admission to orthopedic service for hip fracture and total hip replacement on the left side.  She had a rehabilitation admission to [facility] for one month.  She presents today for a transition of care follow-up visit. Appropriate call within hours of discharge was performed by Melchor Amour LPN under my direction Medicine reconciliation and care plan review was accomplished.  Weight loss noted while in rehab. Now walking with a walker. Lost weight due to food at rehab. Happy to be home. Staying by herself with son  Next door. Cannot complete all daily living activities at home due to limitations Needs to continue PT and cannot travel, needs OT evaluation at home. High fall risk.  History of iron deficiency anemia Stable HTN No chest pain. Has three steps at home. Nutrition needs addressed.    Review of Systems  Constitutional: Positive for activity change, appetite change, fatigue and unexpected weight change.  HENT: Positive for congestion and rhinorrhea.   Eyes: Negative.   Respiratory: Positive for cough.   Gastrointestinal: Negative.   Genitourinary: Positive for urgency and frequency.  Neurological: Positive for weakness. Negative for speech difficulty and light-headedness.  Hematological: Bruises/bleeds easily.       Objective:   Physical Exam  Nursing note and vitals reviewed. Constitutional: She is oriented to person, place, and time. She appears well-developed and well-nourished. No distress.  HENT:  Head: Normocephalic and atraumatic.  Eyes: Conjunctivae are normal. Pupils are equal, round, and reactive to light.  Neck: Normal range of motion. Neck supple. No JVD present. No tracheal deviation present. No thyromegaly present.  Cardiovascular: Normal rate and regular rhythm.   Murmur heard. Pulmonary/Chest: Effort normal and breath sounds  normal. She has no wheezes. She exhibits no tenderness.  Abdominal: Soft. Bowel sounds are normal.  Musculoskeletal: Normal range of motion. She exhibits edema and tenderness.  Lymphadenopathy:    She has no cervical adenopathy.  Neurological: She is alert and oriented to person, place, and time. She has normal reflexes. No cranial nerve deficit.  Skin: Skin is warm and dry. She is not diaphoretic.  Psychiatric: She has a normal mood and affect. Her behavior is normal.          Assessment & Plan:  Stable blood pressure.  Weight has decreased  By  20 pounds.  Has no infection. History of abnormal mammogram. Weight loss is a significant issue Boost one can BID Measure labs Refer to  [facility] for nursing aid and PT Monitor  in 3 weeks

## 2012-07-29 NOTE — Telephone Encounter (Signed)
Transitional care management  Admit date to hospital:06/26/2012 Discharge date from hospital;07/01/2012 Admit date to [facility]:07/01/2012 Discharge date to [facility]: 07/27/2012  Discharge diagnosis: Principal Problem:   Hip fracture-left Active Problems:   HTN   Osteoporosis    Total knee replacement,rt,hx of  Was discharged to [facility] rehab skilled care unit in stable condition.  Talked with son several times while mother was in rehab and he states she was improving nicely and was cooperating with rehab as ordered. She has lost about 5 pounds.  She is knowledgeable of her condition and her medication.  She was discharged from [facility] to her her home .  Her son and daughter-in-law live n ext door and attends to her needs.  She is ambulating well with a walker with no assistance and has no pain .  Pt instructed to call for any questions or needs.  Her follow up with Dr. Lovell Sheehan is 08/29/2012   .

## 2012-07-29 NOTE — Patient Instructions (Addendum)
I want you to start boost supplement one can twice daily in between meals I am referring physical occupational therapy to your house to determine if physical therapy needs

## 2012-07-30 LAB — PREALBUMIN: Prealbumin: 17.9 mg/dL (ref 17.0–34.0)

## 2012-08-12 ENCOUNTER — Encounter: Payer: Self-pay | Admitting: Family

## 2012-08-12 ENCOUNTER — Ambulatory Visit (INDEPENDENT_AMBULATORY_CARE_PROVIDER_SITE_OTHER): Payer: Medicare Other | Admitting: Family

## 2012-08-12 VITALS — BP 118/68 | HR 82 | Wt 105.0 lb

## 2012-08-12 DIAGNOSIS — Z9181 History of falling: Secondary | ICD-10-CM

## 2012-08-12 DIAGNOSIS — D649 Anemia, unspecified: Secondary | ICD-10-CM

## 2012-08-12 DIAGNOSIS — K921 Melena: Secondary | ICD-10-CM

## 2012-08-12 DIAGNOSIS — R634 Abnormal weight loss: Secondary | ICD-10-CM

## 2012-08-12 NOTE — Progress Notes (Signed)
Subjective:    Patient ID: Diane Little, female    DOB: 09/14/1924, 79 y.o.   MRN: [id number]  HPI 79 year old white female, patient of Dr. Lovell Sheehan that presents to the office for 2 week follow up for weight loss. Patient reports she is trying to eat 2 meals daily with snacks in between meals; she drinks Boost supplements as well. She reports feeling dizzy at times with change of positions, states she has a bowel movement daily but has noticed some bright red blood in stool with straining. Home health nursing comes to her home several times a week for monitoring and weekly weights. Last colonoscopy 2002, last Hgb 10.1.    Review of Systems  Constitutional: Positive for appetite change and unexpected weight change. Negative for fatigue.       States she tries to eat 2 meals daily with snacks, is drinking Boost. Does not have much of an appetite.  HENT: Negative.  Negative for mouth sores.        Mouth is dry.  Respiratory: Negative.   Cardiovascular: Negative.   Gastrointestinal: Positive for constipation and blood in stool. Negative for nausea, vomiting, abdominal pain and abdominal distention.       Daily BM, stools are hard, has noticed blood in her stool in last couple of days.   Endocrine: Negative.   Genitourinary: Negative.   Musculoskeletal: Positive for gait problem.       Generalized weakness, slow with ambulation, uses walker for stability.  Skin: Negative.   Allergic/Immunologic: Negative.   Neurological: Positive for light-headedness.       States feels light headed when she changes positions from sitting to standing.  Hematological: Bruises/bleeds easily.  Psychiatric/Behavioral: Negative.        Objective:   Physical Exam  Constitutional: She is oriented to person, place, and time. She appears well-developed and well-nourished.  HENT:  Head: Normocephalic.  Right Ear: External ear normal.  Left Ear: External ear normal.  Nose: Nose normal.  Mouth/Throat:  Oropharynx is clear and moist.  Dry mucous membranes, tongue dark pink  Eyes: Conjunctivae and EOM are normal. Pupils are equal, round, and reactive to light.  Neck: Normal range of motion. Neck supple. No JVD present. No tracheal deviation present. No thyromegaly present.  Cardiovascular: Normal rate, regular rhythm, normal heart sounds and intact distal pulses.  Exam reveals no gallop and no friction rub.   No murmur heard. Recheck blood pressure 104/56 with c/o feeling light headed from lying to sitting  Pulmonary/Chest: Effort normal and breath sounds normal. No stridor. No respiratory distress. She has no wheezes. She has no rales. She exhibits no tenderness.  Abdominal: Soft. Bowel sounds are normal.  Genitourinary: Guaiac positive stool.  Musculoskeletal: Normal range of motion.  Neurological: She is alert and oriented to person, place, and time. She has normal reflexes.  Skin: Skin is warm and dry.  Psychiatric: She has a normal mood and affect. Her behavior is normal. Judgment and thought content normal.     Past Medical History  Diagnosis Date  . Allergy   . Anemia   . Hypertension   . Osteoporosis   . Nail fungus   . Rash     History   Social History  . Marital Status: Single    Spouse Name: N/A    Number of Children: N/A  . Years of Education: N/A   Occupational History  . retired    Social History Main Topics  . Smoking status:  Never Smoker   . Smokeless tobacco: Never Used  . Alcohol Use: No  . Drug Use: No  . Sexually Active: Not Currently   Other Topics Concern  . Not on file   Social History Narrative  . No narrative on file    Past Surgical History  Procedure Laterality Date  . Total knee arthroplasty    . Replacement total knee    . Tonsillectomy    . Eye surgery      cataracts bilateral  . Hip arthroplasty Left 06/28/2012    Procedure: Left HIP HEMI ARTHROPLASTY;  Surgeon: Shelda Pal, MD;  Location: [facility];  Service: Orthopedics;   Laterality: Left;    Family History  Problem Relation Age of Onset  . Heart disease Mother     Allergies  Allergen Reactions  . Fish Allergy Rash  . Pneumococcal Vaccine Polyvalent   . Ramipril     REACTION: rash    Current Outpatient Prescriptions on File Prior to Visit  Medication Sig Dispense Refill  . acetaminophen (TYLENOL) 650 MG CR tablet Take 650 mg by mouth every 8 (eight) hours as needed for pain.       Marland Kitchen amLODipine (NORVASC) 5 MG tablet Take 5 mg by mouth daily.      Marland Kitchen amoxicillin (AMOXIL) 500 MG capsule Take 2,000 mg by mouth as needed (for dental appt prophylaxis.).       Marland Kitchen bisacodyl (DULCOLAX) 10 MG suppository Place 1 suppository (10 mg total) rectally daily as needed.  12 suppository  0  . calcium-vitamin D (OSCAL WITH D 500-200) 500-200 MG-UNIT per tablet Take 1 tablet by mouth 2 (two) times daily.        . ferrous sulfate 325 (65 FE) MG tablet Take 1 tablet (325 mg total) by mouth daily with breakfast.  30 tablet  1  . fluticasone (FLONASE) 50 MCG/ACT nasal spray Place 2 sprays into the nose daily.  16 g  6  . loratadine (CLARITIN) 10 MG tablet Take 1 tablet (10 mg total) by mouth daily.  90 tablet  3  . Misc Natural Products (OSTEO BI-FLEX ADV DOUBLE ST PO) Take 1 tablet by mouth daily.       . Multiple Vitamin (MULTIVITAMIN WITH MINERALS) TABS Take 1 tablet by mouth daily.      Marland Kitchen oxybutynin (DITROPAN) 5 MG tablet Take 1 tablet (5 mg total) by mouth 2 (two) times daily.  60 tablet  11  . oxyCODONE-acetaminophen (ROXICET) 5-325 MG per tablet Take 1 tablet by mouth every 4 (four) hours as needed for pain.  30 tablet  0  . polyethylene glycol (MIRALAX / GLYCOLAX) packet Take 17 g by mouth daily as needed.  14 each  0  . pyridoxine (B-6) 50 MG tablet Take 50 mg by mouth daily.        . traMADol (ULTRAM) 50 MG tablet Take 1-2 tablets (50-100 mg total) by mouth every 6 (six) hours as needed.  65 tablet  4  . vitamin B-12 (CYANOCOBALAMIN) 250 MCG tablet Take 250 mcg by  mouth daily.        Marland Kitchen zinc gluconate 50 MG tablet Take 50 mg by mouth daily.         No current facility-administered medications on file prior to visit.    BP 118/68  Pulse 82  Wt 105 lb (47.628 kg)  BMI 17.47 kg/m2  SpO2 95%chart     Assessment & Plan:  Assessment: 1. Blood in stool 2.  Loss of weight 3. Anemia 4. Fall Risk  Plan In lieu of findings.. Blood in stool, weight loss, light headedness and low hemoglobin, I have suggested further testing to be sure that her symptoms do not have any concerns for malignancy. Recommend continued meal replacement/supplement with Boost or Ensure. Increase hydration.Spoke with son for plan, fall precautions reviewed Refer to GI. Follow-up pending consult.

## 2012-08-12 NOTE — Patient Instructions (Addendum)
Gastrointestinal Bleeding Gastrointestinal (GI) bleeding means there is bleeding somewhere along the digestive tract, between the mouth and anus. CAUSES  There are many different problems that can cause GI bleeding. Possible causes include:  Esophagitis. This is inflammation, irritation, or swelling of the esophagus.  Hemorrhoids.These are veins that are full of blood (engorged) in the rectum. They cause pain, inflammation, and may bleed.  Anal fissures.These are areas of painful tearing which may bleed. They are often caused by passing hard stool.  Diverticulosis.These are pouches that form on the colon over time, with age, and may bleed significantly.  Diverticulitis.This is inflammation in areas with diverticulosis. It can cause pain, fever, and bloody stools, although bleeding is rare.  Polyps and cancer. Colon cancer often starts out as precancerous polyps.  Gastritis and ulcers.Bleeding from the upper gastrointestinal tract (near the stomach) may travel through the intestines and produce black, sometimes tarry, often bad smelling stools. In certain cases, if the bleeding is fast enough, the stools may not be black, but red. This condition may be life-threatening. SYMPTOMS   Vomiting bright red blood or material that looks like coffee grounds.  Bloody, black, or tarry stools. DIAGNOSIS  Your caregiver may diagnose your condition by taking your history and performing a physical exam. More tests may be needed, including:  X-rays and other imaging tests.  Esophagogastroduodenoscopy (EGD). This test uses a flexible, lighted tube to look at your esophagus, stomach, and small intestine.  Colonoscopy. This test uses a flexible, lighted tube to look at your colon. TREATMENT  Treatment depends on the cause of your bleeding.   For bleeding from the esophagus, stomach, small intestine, or colon, the caregiver doing your EGD or colonoscopy may be able to stop the bleeding as part of  the procedure.  Inflammation or infection of the colon can be treated with medicines.  Many rectal problems can be treated with creams, suppositories, or warm baths.  Surgery is sometimes needed.  Blood transfusions are sometimes needed if you have lost a lot of blood. If bleeding is slow, you may be allowed to go home. If there is a lot of bleeding, you will need to stay in the hospital for observation. HOME CARE INSTRUCTIONS   Take any medicines exactly as prescribed.  Keep your stools soft by eating foods that are high in fiber. These foods include whole grains, legumes, fruits, and vegetables. Prunes (1 to 3 a day) work well for many people.  Drink enough fluids to keep your urine clear or pale yellow.  Take sitz baths if advised. A sitz bath is when you sit in a bathtub with warm water for 10 to 15 minutes to soak, soothe, and cleanse the rectal area. SEEK IMMEDIATE MEDICAL CARE IF:   Your bleeding increases.  You feel lightheaded, weak, or you faint.  You have severe cramps in your back or abdomen.  You pass large blood clots in your stool.  Your problems are getting worse. MAKE SURE YOU:   Understand these instructions.  Will watch your condition.  Will get help right away if you are not doing well or get worse. Document Released: 04/21/2000 Document Revised: 07/17/2011 Document Reviewed: 04/03/2011 Exit[employer] Patient Information 2013 [employer].  

## 2012-08-13 ENCOUNTER — Telehealth: Payer: Self-pay | Admitting: Family

## 2012-08-13 ENCOUNTER — Encounter: Payer: Self-pay | Admitting: Gastroenterology

## 2012-08-13 NOTE — Telephone Encounter (Signed)
Pt said she was here yesterday. Pt said she has tried to contact Dr Orvan July office, (orthopedic) but cannot get them on the phone. Pt would like to know if she can drive.

## 2012-08-13 NOTE — Telephone Encounter (Signed)
As long as she does not have any lightheadedness or dizziness, and feels she is physically able to drive,  it is ok to drive.

## 2012-08-13 NOTE — Telephone Encounter (Signed)
Pt aware and verbalized understanding.  

## 2012-08-25 DIAGNOSIS — R634 Abnormal weight loss: Secondary | ICD-10-CM

## 2012-08-25 DIAGNOSIS — E43 Unspecified severe protein-calorie malnutrition: Secondary | ICD-10-CM

## 2012-08-25 DIAGNOSIS — I10 Essential (primary) hypertension: Secondary | ICD-10-CM

## 2012-08-25 DIAGNOSIS — Z96649 Presence of unspecified artificial hip joint: Secondary | ICD-10-CM

## 2012-09-03 ENCOUNTER — Ambulatory Visit (INDEPENDENT_AMBULATORY_CARE_PROVIDER_SITE_OTHER): Payer: Medicare Other | Admitting: Gastroenterology

## 2012-09-03 ENCOUNTER — Encounter: Payer: Self-pay | Admitting: Gastroenterology

## 2012-09-03 VITALS — BP 94/68 | HR 84 | Ht 65.0 in | Wt 102.0 lb

## 2012-09-03 DIAGNOSIS — R634 Abnormal weight loss: Secondary | ICD-10-CM

## 2012-09-03 DIAGNOSIS — R195 Other fecal abnormalities: Secondary | ICD-10-CM

## 2012-09-03 MED ORDER — PEG-KCL-NACL-NASULF-NA ASC-C 100 G PO SOLR: 1.0000 | Freq: Once | ORAL | Status: DC

## 2012-09-03 NOTE — Progress Notes (Signed)
History of Present Illness: This is an 79 year old female with Hemoccult positive stool, weight loss and normocytic anemia. She states she has had about a 20 pound weight loss over the past 1-1/2 years. She notes long term, mild constipation that is easily managed. These symptoms have been stable. She has a normocytic anemia, Hb=10.1, and has recently been treated with iron. Hemoccult positive stool was noted on a recent rectal examination. She previously underwent colonoscopy in 2002 that showed diverticulosis and internal hemorrhoids. Denies abdominal pain, diarrhea, change in stool caliber, melena, hematochezia, nausea, vomiting, dysphagia, reflux symptoms, chest pain.  Review of Systems: Pertinent positive and negative review of systems were noted in the above HPI section. All other review of systems were otherwise negative.  Current Medications, Allergies, Past Medical History, Past Surgical History, Family History and Social History were reviewed in [employer] record.  Physical Exam: General: Well developed , well nourished, elderly, thin, no acute distress Head: Normocephalic and atraumatic Eyes:  sclerae anicteric, EOMI Ears: Normal auditory acuity Mouth: No deformity or lesions Neck: Supple, no masses or thyromegaly Lungs: Clear throughout to auscultation Heart: Regular rate and rhythm; no murmurs, rubs or bruits Abdomen: Soft, non tender and non distended. No masses, hepatosplenomegaly or hernias noted. Normal Bowel sounds Rectal: Deferred to colonoscopy, recent exam showed no lesions and Hemoccult positive stool Musculoskeletal: Symmetrical with no gross deformities  Skin: No lesions on visible extremities Pulses:  Normal pulses noted Extremities: No clubbing, cyanosis, edema or deformities noted Neurological: Alert oriented x 4, grossly non focal, uses a walker. Cervical Nodes:  No significant cervical adenopathy Inguinal Nodes: No significant inguinal  adenopathy Psychological:  Alert and cooperative. Normal mood and affect  Assessment and Recommendations:  1. Hemoccult positive stool, normocytic anemia, unexplained weight loss. Rule out colorectal neoplasms. Schedule colonoscopy. The risks, benefits, and alternatives to colonoscopy with possible biopsy and possible polypectomy were discussed with the patient and they consent to proceed.

## 2012-09-03 NOTE — Patient Instructions (Addendum)
You have been scheduled for a colonoscopy with propofol. Please follow written instructions given to you at your visit today.  Please pick up your prep kit at the pharmacy within the next 1-3 days. If you use inhalers (even only as needed), please bring them with you on the day of your procedure. Your physician has requested that you go to www.startemmi.com and enter the access code given to you at your visit today. This web site gives a general overview about your procedure. However, you should still follow specific instructions given to you by our office regarding your preparation for the procedure.  Hold your iron 5 days prior to your procedure.    Thank you for choosing me and [facility].  Venita Lick. Pleas Koch., MD., [facility]

## 2012-09-06 ENCOUNTER — Telehealth: Payer: Self-pay | Admitting: Gastroenterology

## 2012-09-06 ENCOUNTER — Telehealth: Payer: Self-pay | Admitting: *Deleted

## 2012-09-06 NOTE — Telephone Encounter (Signed)
Spoke with patient, and she said that she ate nuts and beans.   She said that she also didn't have the money.  Then I spoke to her son, and he said that she would follow the directions from here on out, and that he would pay for the prep.  He stated that he would come with her, and that we would see her as scheduled.

## 2012-09-09 ENCOUNTER — Ambulatory Visit (AMBULATORY_SURGERY_CENTER): Payer: Medicare Other | Admitting: Gastroenterology

## 2012-09-09 ENCOUNTER — Encounter: Payer: Self-pay | Admitting: Gastroenterology

## 2012-09-09 VITALS — BP 115/75 | HR 72 | Temp 98.1°F | Resp 24 | Ht 65.0 in | Wt 104.0 lb

## 2012-09-09 DIAGNOSIS — D126 Benign neoplasm of colon, unspecified: Secondary | ICD-10-CM

## 2012-09-09 DIAGNOSIS — D649 Anemia, unspecified: Secondary | ICD-10-CM

## 2012-09-09 DIAGNOSIS — R195 Other fecal abnormalities: Secondary | ICD-10-CM

## 2012-09-09 DIAGNOSIS — R634 Abnormal weight loss: Secondary | ICD-10-CM

## 2012-09-09 MED ORDER — SODIUM CHLORIDE 0.9 % IV SOLN: 500.0000 mL | INTRAVENOUS | Status: DC

## 2012-09-09 NOTE — Progress Notes (Signed)
Called to room to assist during endoscopic procedure.  Patient ID and intended procedure confirmed with present staff. Received instructions for my participation in the procedure from the performing physician.  

## 2012-09-09 NOTE — Progress Notes (Signed)
Patient did not experience any of the following events: a burn prior to discharge; a fall within the facility; wrong site/side/patient/procedure/implant event; or a hospital transfer or hospital admission upon discharge from the facility. (G8907) Patient did not have preoperative order for IV antibiotic SSI prophylaxis. (G8918)  

## 2012-09-09 NOTE — Op Note (Signed)
[facility] [address] [place], [address]   COLONOSCOPY PROCEDURE REPORT  PATIENT: [name], [name]  MR#: [id number] BIRTHDATE: Jan 16, 1925 , 87  yrs. old GENDER: Female ENDOSCOPIST: Meryl Dare, MD, [facility] PROCEDURE DATE:  09/09/2012 PROCEDURE:   Colonoscopy with snare polypectomy ASA CLASS:   Class III INDICATIONS:heme-positive stool, Weight loss, and Anemia, non-specific. MEDICATIONS: MAC sedation, administered by CRNA and propofol (Diprivan) 200mg  IV DESCRIPTION OF PROCEDURE:   After the risks benefits and alternatives of the procedure were thoroughly explained, informed consent was obtained.  A digital rectal exam revealed no abnormalities of the rectum.   The LB [id number]-[id number]  endoscope was introduced through the anus and advanced to the cecum, which was identified by both the appendix and ileocecal valve. No adverse events experienced.   Limited by a tortuous colon.   The quality of the prep was adequate, using MoviPrep  The instrument was then slowly withdrawn as the colon was fully examined.  COLON FINDINGS: A sessile polyp measuring 1 cm in size was found in the ascending colon.  A polypectomy was performed using snare cautery.  The resection was complete and the polyp tissue was completely retrieved.   Sessile polyp(s) measuring 6 mm in size were found in the transverse colon.  A polypectomy was performed using a cold snare.  The resection was complete and the polyp tissue was completely retrieved.   Moderate diverticulosis was noted in the descending colon and sigmoid colon.   The colon was otherwise normal.  There was no diverticulosis, inflammation, polyps or cancers unless previously stated.  Retroflexed views revealed small internal hemorrhoids. The time to cecum=5 minutes 12 seconds.  Withdrawal time=12 minutes 05 seconds.  The scope was withdrawn and the procedure completed. COMPLICATIONS: There were no complications.  ENDOSCOPIC  IMPRESSION: 1.   Sessile polyp measuring 1 cm in the ascending colon; polypectomy performed using snare cautery 2.   Sessile polyp(s) measuring 6 mm in the transverse colon; polypectomy performed using a cold snare 3.   Moderate diverticulosis was noted in the descending colon and sigmoid colon 4.   Small internal hemorrhoids  RECOMMENDATIONS: 1.  Hold aspirin, aspirin products, and anti-inflammatory medication for 2 weeks. 2.  High fiber diet with liberal fluid intake. 3.  Given your age, you will not need another colonoscopy for colon cancer screening or polyp surveillance.  These types of tests usually stop around the age 103. 4.  My office will arrange for you to have a CT scan of chest, abdomen and pelvis.  eSigned:  Meryl Dare, MD, [facility] 09/09/2012 3:45 PM     PATIENT NAME:  Diane Little, Diane Little MR#: [id number]

## 2012-09-09 NOTE — Patient Instructions (Signed)
Colon polyps x 2 removed today. Diverticulosis and hemorrhoids seen. Try to follow high fiber diet with liberal fluid intake. Handouts given on polyps,diverticulosis,hemorrhoids, and high fiber diet. Hold aspirin,aspirin containing products and anti-inflammatory medications for 2 weeks. Our office will arrange for you to have a CT scan,we will call you with that appointment. Call us with any questions or concerns. Thank you!   YOU HAD AN ENDOSCOPIC PROCEDURE TODAY AT THE [facility]: Refer to the procedure report that was given to you for any specific questions about what was found during the examination.  If the procedure report does not answer your questions, please call your gastroenterologist to clarify.  If you requested that your care partner not be given the details of your procedure findings, then the procedure report has been included in a sealed envelope for you to review at your convenience later.  YOU SHOULD EXPECT: Some feelings of bloating in the abdomen. Passage of more gas than usual.  Walking can help get rid of the air that was put into your GI tract during the procedure and reduce the bloating. If you had a lower endoscopy (such as a colonoscopy or flexible sigmoidoscopy) you may notice spotting of blood in your stool or on the toilet paper. If you underwent a bowel prep for your procedure, then you may not have a normal bowel movement for a few days.  DIET: Your first meal following the procedure should be a light meal and then it is ok to progress to your normal diet.  A half-sandwich or bowl of soup is an example of a good first meal.  Heavy or fried foods are harder to digest and may make you feel nauseous or bloated.  Likewise meals heavy in dairy and vegetables can cause extra gas to form and this can also increase the bloating.  Drink plenty of fluids but you should avoid alcoholic beverages for 24 hours.  ACTIVITY: Your care partner should take you home directly after  the procedure.  You should plan to take it easy, moving slowly for the rest of the day.  You can resume normal activity the day after the procedure however you should NOT DRIVE or use heavy machinery for 24 hours (because of the sedation medicines used during the test).    SYMPTOMS TO REPORT IMMEDIATELY: A gastroenterologist can be reached at any hour.  During normal business hours, 8:30 AM to 5:00 PM Monday through Friday, call [phone].  After hours and on weekends, please call the GI answering service at [phone] who will take a message and have the physician on call contact you.   Following lower endoscopy (colonoscopy or flexible sigmoidoscopy):  Excessive amounts of blood in the stool  Significant tenderness or worsening of abdominal pains  Swelling of the abdomen that is new, acute  Fever of 100F or higher  Following upper endoscopy (EGD)  Vomiting of blood or coffee ground material  New chest pain or pain under the shoulder blades  Painful or persistently difficult swallowing  New shortness of breath  Fever of 100F or higher  Black, tarry-looking stools  FOLLOW UP: If any biopsies were taken you will be contacted by phone or by letter within the next 1-3 weeks.  Call your gastroenterologist if you have not heard about the biopsies in 3 weeks.  Our staff will call the home number listed on your records the next business day following your procedure to check on you and address any questions or  concerns that you may have at that time regarding the information given to you following your procedure. This is a courtesy call and so if there is no answer at the home number and we have not heard from you through the emergency physician on call, we will assume that you have returned to your regular daily activities without incident.  SIGNATURES/CONFIDENTIALITY: You and/or your care partner have signed paperwork which will be entered into your electronic medical record.  These  signatures attest to the fact that that the information above on your After Visit Summary has been reviewed and is understood.  Full responsibility of the confidentiality of this discharge information lies with you and/or your care-partner.

## 2012-09-10 ENCOUNTER — Telehealth: Payer: Self-pay | Admitting: *Deleted

## 2012-09-10 NOTE — Telephone Encounter (Signed)
  Follow up Call-  Call back number 09/09/2012  Post procedure Call Back phone  # [phone]  Permission to leave phone message Yes     Patient questions:  Do you have a fever, pain , or abdominal swelling? no Pain Score  0 *  Have you tolerated food without any problems? yes  Have you been able to return to your normal activities? yes  Do you have any questions about your discharge instructions: Diet   no Medications  no Follow up visit  no  Do you have questions or concerns about your Care? no  Actions: * If pain score is 4 or above: No action needed, pain <4.

## 2012-09-11 ENCOUNTER — Other Ambulatory Visit: Payer: Self-pay | Admitting: *Deleted

## 2012-09-11 ENCOUNTER — Other Ambulatory Visit: Payer: Self-pay

## 2012-09-11 ENCOUNTER — Other Ambulatory Visit (INDEPENDENT_AMBULATORY_CARE_PROVIDER_SITE_OTHER): Payer: Medicare Other

## 2012-09-11 DIAGNOSIS — R634 Abnormal weight loss: Secondary | ICD-10-CM

## 2012-09-11 DIAGNOSIS — R195 Other fecal abnormalities: Secondary | ICD-10-CM

## 2012-09-11 DIAGNOSIS — D649 Anemia, unspecified: Secondary | ICD-10-CM

## 2012-09-11 LAB — CREATININE, SERUM: Creatinine, Ser: 0.7 mg/dL (ref 0.4–1.2)

## 2012-09-11 MED ORDER — AMLODIPINE BESYLATE 5 MG PO TABS: 5.0000 mg | ORAL_TABLET | Freq: Every day | ORAL | Status: DC

## 2012-09-11 NOTE — Progress Notes (Signed)
Patient is scheduled for CT scan chest, abdomen and pelvis for 09/18/12 2:30 at [facility] office.  She will need to come for labs prior to check BUN and creatinine Left message for patient to call back  I did speak with the patient and she is aware to come for labs this week and CT on 09/18/12.  She verbalized understanding of instructions provided to her

## 2012-09-13 ENCOUNTER — Other Ambulatory Visit: Payer: Self-pay | Admitting: *Deleted

## 2012-09-16 ENCOUNTER — Encounter: Payer: Self-pay | Admitting: Gastroenterology

## 2012-09-17 ENCOUNTER — Other Ambulatory Visit: Payer: Medicare Other

## 2012-09-18 ENCOUNTER — Ambulatory Visit (INDEPENDENT_AMBULATORY_CARE_PROVIDER_SITE_OTHER)
Admission: RE | Admit: 2012-09-18 | Discharge: 2012-09-18 | Disposition: A | Discharge status: Home / Self Care | Payer: Medicare Other | Source: Ambulatory Visit | Attending: Gastroenterology | Admitting: Gastroenterology

## 2012-09-18 DIAGNOSIS — R195 Other fecal abnormalities: Secondary | ICD-10-CM

## 2012-09-18 DIAGNOSIS — D649 Anemia, unspecified: Secondary | ICD-10-CM

## 2012-09-18 DIAGNOSIS — R634 Abnormal weight loss: Secondary | ICD-10-CM

## 2012-09-18 MED ORDER — IOHEXOL 300 MG/ML  SOLN
100.0000 mL | Freq: Once | INTRAMUSCULAR | Status: AC | PRN
  Administered 2012-09-18: 80 mL via INTRAVENOUS

## 2012-10-09 ENCOUNTER — Telehealth: Payer: Self-pay | Admitting: Internal Medicine

## 2012-10-09 NOTE — Telephone Encounter (Signed)
Reassured patient-per dr Lovell Sheehan, [facility] and prealbumin lab 1 week prior to appointment in july

## 2012-10-09 NOTE — Telephone Encounter (Signed)
Pt would like to know if MD has answers from the referrals he had set her up for.  Specifically why she is losing weight. Pt has appt 11/18/12.

## 2012-11-12 ENCOUNTER — Other Ambulatory Visit (INDEPENDENT_AMBULATORY_CARE_PROVIDER_SITE_OTHER): Payer: Medicare Other

## 2012-11-12 ENCOUNTER — Other Ambulatory Visit: Payer: Medicare Other

## 2012-11-12 DIAGNOSIS — R634 Abnormal weight loss: Secondary | ICD-10-CM

## 2012-11-12 LAB — CBC WITH DIFFERENTIAL/PLATELET
Basophils Absolute: 0 10*3/uL (ref 0.0–0.1)
Eosinophils Relative: 1.8 % (ref 0.0–5.0)
HCT: 32.2 % — ABNORMAL LOW (ref 36.0–46.0)
Lymphs Abs: 0.8 10*3/uL (ref 0.7–4.0)
MCV: 95.9 fl (ref 78.0–100.0)
Monocytes Absolute: 0.6 10*3/uL (ref 0.1–1.0)
Monocytes Relative: 10.9 % (ref 3.0–12.0)
Neutrophils Relative %: 71.8 % (ref 43.0–77.0)
Platelets: 166 10*3/uL (ref 150.0–400.0)
RDW: 15.7 % — ABNORMAL HIGH (ref 11.5–14.6)
WBC: 5.3 10*3/uL (ref 4.5–10.5)

## 2012-11-13 LAB — PREALBUMIN: Prealbumin: 25.5 mg/dL (ref 17.0–34.0)

## 2012-11-18 ENCOUNTER — Ambulatory Visit (INDEPENDENT_AMBULATORY_CARE_PROVIDER_SITE_OTHER): Payer: Medicare Other | Admitting: Internal Medicine

## 2012-11-18 ENCOUNTER — Encounter: Payer: Self-pay | Admitting: Internal Medicine

## 2012-11-18 VITALS — BP 110/70 | HR 80 | Temp 98.2°F | Resp 16 | Ht 65.0 in | Wt 105.0 lb

## 2012-11-18 DIAGNOSIS — D509 Iron deficiency anemia, unspecified: Secondary | ICD-10-CM

## 2012-11-18 DIAGNOSIS — N318 Other neuromuscular dysfunction of bladder: Secondary | ICD-10-CM

## 2012-11-18 DIAGNOSIS — N3281 Overactive bladder: Secondary | ICD-10-CM

## 2012-11-18 DIAGNOSIS — I10 Essential (primary) hypertension: Secondary | ICD-10-CM

## 2012-11-18 MED ORDER — AMLODIPINE BESYLATE 2.5 MG PO TABS: 2.5000 mg | ORAL_TABLET | Freq: Every day | ORAL | Status: DC

## 2012-11-18 NOTE — Patient Instructions (Signed)
Take one fusion a day until samples gone

## 2012-11-18 NOTE — Progress Notes (Signed)
Subjective:    Patient ID: Diane Little, female    DOB: [birth year], 79 y.o.   MRN: [id number]  HPI Is an 79 year old female who presents for followup after diagnosis of weight loss of undetermined etiology she had an evaluation by gastroenterology including a CT scan of the chest abdomen and pelvis which revealed no focal pathology that could explain the weight loss to a colonoscopy which revealed diverticulosis.  She has been taking boost twice a day and her weight has stabilized Additional labs including a pre-albumin and a CBC had improved Continue iron supplementation and recommend continued post   Review of Systems  Constitutional: Positive for appetite change and unexpected weight change. Negative for activity change and fatigue.  HENT: Negative for ear pain, congestion, neck pain, postnasal drip and sinus pressure.   Eyes: Negative for redness and visual disturbance.  Respiratory: Positive for shortness of breath. Negative for cough and wheezing.   Gastrointestinal: Positive for abdominal pain. Negative for abdominal distention.  Genitourinary: Negative for dysuria, frequency and menstrual problem.  Musculoskeletal: Negative for myalgias, joint swelling and arthralgias.  Skin: Negative for rash and wound.  Neurological: Positive for weakness. Negative for dizziness and headaches.  Hematological: Negative for adenopathy. Does not bruise/bleed easily.  Psychiatric/Behavioral: Negative for sleep disturbance and decreased concentration.   Past Medical History  Diagnosis Date  . Allergy   . Anemia   . Hypertension   . Osteoporosis   . Nail fungus   . Rash   . Diverticulosis   . Internal hemorrhoids     History   Social History  . Marital Status: Single    Spouse Name: N/A    Number of Children: 2  . Years of Education: N/A   Occupational History  . retired    Social History Main Topics  . Smoking status: Never Smoker   . Smokeless tobacco: Never Used  .  Alcohol Use: No  . Drug Use: No  . Sexually Active: Not Currently   Other Topics Concern  . Not on file   Social History Narrative  . No narrative on file    Past Surgical History  Procedure Laterality Date  . Replacement total knee Right   . Tonsillectomy    . Cataract extraction Bilateral   . Hip arthroplasty Left 06/28/2012    Procedure: Left HIP HEMI ARTHROPLASTY;  Surgeon: Shelda Pal, MD;  Location: [facility];  Service: Orthopedics;  Laterality: Left;    Family History  Problem Relation Age of Onset  . Heart disease Mother     Allergies  Allergen Reactions  . Fish Allergy Rash    ALL SEAFOOD  . Pneumococcal Vaccine Polyvalent   . Ramipril     REACTION: rash    Current Outpatient Prescriptions on File Prior to Visit  Medication Sig Dispense Refill  . acetaminophen (TYLENOL) 650 MG CR tablet Take 650 mg by mouth every 8 (eight) hours as needed for pain.       Marland Kitchen amLODipine (NORVASC) 5 MG tablet Take 1 tablet (5 mg total) by mouth daily.  90 tablet  3  . amoxicillin (AMOXIL) 500 MG capsule Take 2,000 mg by mouth as needed (for dental appt prophylaxis.).       Marland Kitchen bisacodyl (DULCOLAX) 10 MG suppository Place 1 suppository (10 mg total) rectally daily as needed.  12 suppository  0  . calcium-vitamin D (OSCAL WITH D 500-200) 500-200 MG-UNIT per tablet Take 1 tablet by mouth 2 (two) times daily.        Marland Kitchen  ferrous sulfate 325 (65 FE) MG tablet Take 1 tablet (325 mg total) by mouth daily with breakfast.  30 tablet  1  . loratadine (CLARITIN) 10 MG tablet Take 1 tablet (10 mg total) by mouth daily.  90 tablet  3  . Misc Natural Products (OSTEO BI-FLEX ADV DOUBLE ST PO) Take 1 tablet by mouth daily.       . Multiple Vitamin (MULTIVITAMIN WITH MINERALS) TABS Take 1 tablet by mouth daily.      Marland Kitchen oxybutynin (DITROPAN) 5 MG tablet Take 1 tablet (5 mg total) by mouth 2 (two) times daily.  60 tablet  11  . polyethylene glycol (MIRALAX / GLYCOLAX) packet Take 17 g by mouth daily as  needed.  14 each  0  . pyridoxine (B-6) 50 MG tablet Take 50 mg by mouth daily.        . vitamin B-12 (CYANOCOBALAMIN) 250 MCG tablet Take 250 mcg by mouth daily.        Marland Kitchen zinc gluconate 50 MG tablet Take 50 mg by mouth daily.         No current facility-administered medications on file prior to visit.    BP 110/70  Pulse 80  Temp(Src) 98.2 F (36.8 C)  Resp 16  Ht 5\' 5"  (1.651 m)  Wt 105 lb (47.628 kg)  BMI 17.47 kg/m2       Objective:   Physical Exam  Constitutional: She is oriented to person, place, and time.  thin  Eyes: Conjunctivae are normal. Pupils are equal, round, and reactive to light.  Neck: Normal range of motion. Neck supple.  Cardiovascular: Normal rate.   Murmur heard. Abdominal: Soft. Bowel sounds are normal.  Musculoskeletal: She exhibits edema and tenderness.  Neurological: She is alert and oriented to person, place, and time.          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

## 2012-12-17 NOTE — Telephone Encounter (Signed)
See Previous Encounter

## 2012-12-28 ENCOUNTER — Other Ambulatory Visit: Payer: Self-pay | Admitting: Internal Medicine

## 2013-01-24 ENCOUNTER — Other Ambulatory Visit: Payer: Self-pay | Admitting: Internal Medicine

## 2013-01-27 ENCOUNTER — Encounter: Payer: Self-pay | Admitting: Internal Medicine

## 2013-02-24 ENCOUNTER — Encounter: Payer: Self-pay | Admitting: Internal Medicine

## 2013-02-24 ENCOUNTER — Ambulatory Visit (INDEPENDENT_AMBULATORY_CARE_PROVIDER_SITE_OTHER): Payer: Medicare Other | Admitting: Internal Medicine

## 2013-02-24 VITALS — BP 120/74 | HR 72 | Temp 98.0°F | Resp 16 | Ht 65.0 in | Wt 108.0 lb

## 2013-02-24 DIAGNOSIS — D509 Iron deficiency anemia, unspecified: Secondary | ICD-10-CM

## 2013-02-24 DIAGNOSIS — N3281 Overactive bladder: Secondary | ICD-10-CM

## 2013-02-24 DIAGNOSIS — I10 Essential (primary) hypertension: Secondary | ICD-10-CM

## 2013-02-24 DIAGNOSIS — Z23 Encounter for immunization: Secondary | ICD-10-CM

## 2013-02-24 DIAGNOSIS — J309 Allergic rhinitis, unspecified: Secondary | ICD-10-CM

## 2013-02-24 DIAGNOSIS — N318 Other neuromuscular dysfunction of bladder: Secondary | ICD-10-CM

## 2013-02-24 MED ORDER — FLUTICASONE PROPIONATE 50 MCG/ACT NA SUSP: 2.0000 | Freq: Every day | NASAL | Status: DC | PRN

## 2013-02-24 MED ORDER — FESOTERODINE FUMARATE ER 8 MG PO TB24: 8.0000 mg | ORAL_TABLET | Freq: Every day | ORAL | Status: DC

## 2013-02-24 NOTE — Progress Notes (Signed)
Subjective:    Patient ID: Diane Little, female    DOB: Dec 24, 1924, 79 y.o.   MRN: [id number]  HPI weight increased with two boasts a day She is on the ditropan and still has to nocturia and frequency ? organic brain memory disorder versus auditory processing from hearing loss   Review of Systems  Constitutional: Positive for fatigue. Negative for activity change and appetite change.  HENT: Positive for hearing loss, postnasal drip and rhinorrhea. Negative for congestion, ear pain and sinus pressure.   Eyes: Negative for redness and visual disturbance.  Respiratory: Negative for cough, shortness of breath and wheezing.   Gastrointestinal: Negative for abdominal pain and abdominal distention.  Genitourinary: Negative for dysuria, frequency and menstrual problem.  Musculoskeletal: Negative for arthralgias, joint swelling, myalgias and neck pain.  Skin: Negative for rash and wound.  Neurological: Negative for dizziness, weakness and headaches.  Hematological: Negative for adenopathy. Does not bruise/bleed easily.  Psychiatric/Behavioral: Negative for sleep disturbance and decreased concentration.   Past Medical History  Diagnosis Date  . Allergy   . Anemia   . Hypertension   . Osteoporosis   . Nail fungus   . Rash   . Diverticulosis   . Internal hemorrhoids   . Osteoporosis with fracture     History   Social History  . Marital Status: Single    Spouse Name: N/A    Number of Children: 2  . Years of Education: N/A   Occupational History  . retired    Social History Main Topics  . Smoking status: Never Smoker   . Smokeless tobacco: Never Used  . Alcohol Use: No  . Drug Use: No  . Sexual Activity: Not Currently   Other Topics Concern  . Not on file   Social History Narrative  . No narrative on file    Past Surgical History  Procedure Laterality Date  . Replacement total knee Right   . Tonsillectomy    . Cataract extraction Bilateral   . Hip  arthroplasty Left 06/28/2012    Procedure: Left HIP HEMI ARTHROPLASTY;  Surgeon: Shelda Pal, MD;  Location: [facility];  Service: Orthopedics;  Laterality: Left;    Family History  Problem Relation Age of Onset  . Heart disease Mother     Allergies  Allergen Reactions  . Fish Allergy Rash    ALL SEAFOOD  . Pneumococcal Vaccine Polyvalent   . Ramipril     REACTION: rash    Current Outpatient Prescriptions on File Prior to Visit  Medication Sig Dispense Refill  . acetaminophen (TYLENOL) 650 MG CR tablet Take 650 mg by mouth every 8 (eight) hours as needed for pain.       Marland Kitchen amLODipine (NORVASC) 2.5 MG tablet Take 1 tablet (2.5 mg total) by mouth daily.  90 tablet  3  . bisacodyl (DULCOLAX) 10 MG suppository Place 1 suppository (10 mg total) rectally daily as needed.  12 suppository  0  . calcium-vitamin D (OSCAL WITH D 500-200) 500-200 MG-UNIT per tablet Take 1 tablet by mouth 2 (two) times daily.        . feeding supplement (BOOST HIGH PROTEIN) LIQD Take 1 Container by mouth 2 (two) times daily between meals.      . ferrous sulfate 325 (65 FE) MG tablet Take 1 tablet (325 mg total) by mouth daily with breakfast.  30 tablet  1  . loratadine (CLARITIN) 10 MG tablet Take 1 tablet (10 mg total) by mouth daily.  90  tablet  3  . Misc Natural Products (OSTEO BI-FLEX ADV DOUBLE ST PO) Take 1 tablet by mouth daily.       . Multiple Vitamin (MULTIVITAMIN WITH MINERALS) TABS Take 1 tablet by mouth daily.      . polyethylene glycol (MIRALAX / GLYCOLAX) packet Take 17 g by mouth daily as needed.  14 each  0  . pyridoxine (B-6) 50 MG tablet Take 50 mg by mouth daily.        . vitamin B-12 (CYANOCOBALAMIN) 250 MCG tablet Take 250 mcg by mouth daily.        Marland Kitchen zinc gluconate 50 MG tablet Take 50 mg by mouth daily.        . [DISCONTINUED] oxybutynin (DITROPAN) 5 MG tablet Take 1 tablet (5 mg total) by mouth 2 (two) times daily.  60 tablet  11   No current facility-administered medications on file prior  to visit.    BP 120/74  Pulse 72  Temp(Src) 98 F (36.7 C)  Resp 16  Ht 5\' 5"  (1.651 m)  Wt 108 lb (48.988 kg)  BMI 17.97 kg/m2       Objective:   Physical Exam  Nursing note and vitals reviewed. Constitutional: She is oriented to person, place, and time. She appears well-developed and well-nourished. No distress.  HENT:  Head: Normocephalic and atraumatic.  Eyes: Conjunctivae and EOM are normal. Pupils are equal, round, and reactive to light.  Neck: Normal range of motion. Neck supple. No JVD present. No tracheal deviation present. No thyromegaly present.  Cardiovascular: Normal rate and regular rhythm.   Murmur heard. Pulmonary/Chest: Effort normal and breath sounds normal. She has no wheezes. She exhibits no tenderness.  Abdominal: Soft. Bowel sounds are normal.  Musculoskeletal: Normal range of motion. She exhibits no edema and no tenderness.  Lymphadenopathy:    She has no cervical adenopathy.  Neurological: She is alert and oriented to person, place, and time. She has normal reflexes. No cranial nerve deficit.  Skin: Skin is warm and dry. She is not diaphoretic.  Psychiatric: She has a normal mood and affect. Her behavior is normal.          Assessment & Plan:  Urinary frequency with failure of ditropan to try toviaz 8  milligrams by mouth daily as a trial samples given patient will call with the results of the samples. Has history of iron deficiency anemia stable.  Patient has a history of weight loss now reversed with the use of supplements.  Otherwise patient seems to be doing well on her current medications her blood pressure stable his no shortness of breath no peripheral edema.

## 2013-02-24 NOTE — Patient Instructions (Addendum)
nasonex nose spray daily to help the clearing of the throat   replaceing the ditropan with the toviaz 8 mg one a day

## 2013-06-12 ENCOUNTER — Other Ambulatory Visit: Payer: Self-pay | Admitting: Internal Medicine

## 2013-06-30 ENCOUNTER — Other Ambulatory Visit: Payer: Self-pay | Admitting: *Deleted

## 2013-06-30 DIAGNOSIS — M810 Age-related osteoporosis without current pathological fracture: Secondary | ICD-10-CM

## 2013-07-01 ENCOUNTER — Other Ambulatory Visit (INDEPENDENT_AMBULATORY_CARE_PROVIDER_SITE_OTHER): Payer: Medicare Other

## 2013-07-01 DIAGNOSIS — I10 Essential (primary) hypertension: Secondary | ICD-10-CM

## 2013-07-01 LAB — BASIC METABOLIC PANEL
BUN: 20 mg/dL (ref 6–23)
CHLORIDE: 100 meq/L (ref 96–112)
CO2: 34 mEq/L — ABNORMAL HIGH (ref 19–32)
Calcium: 9.4 mg/dL (ref 8.4–10.5)
Creat: 0.8 mg/dL (ref 0.50–1.10)
Glucose, Bld: 93 mg/dL (ref 70–99)
POTASSIUM: 4.2 meq/L (ref 3.5–5.3)
Sodium: 141 mEq/L (ref 135–145)

## 2013-07-01 NOTE — Addendum Note (Signed)
Addended by: Elmer Picker on: 07/01/2013 02:06 PM   Modules accepted: Orders

## 2013-07-10 ENCOUNTER — Telehealth: Payer: Self-pay | Admitting: Internal Medicine

## 2013-07-10 NOTE — Telephone Encounter (Signed)
Pt son was view blood work results on [employer] and has ?Marland Kitchen Son would like nurse to return his call

## 2013-07-11 ENCOUNTER — Other Ambulatory Visit (HOSPITAL_COMMUNITY): Payer: Self-pay | Admitting: *Deleted

## 2013-07-11 NOTE — Telephone Encounter (Signed)
Pt's son in the hospital, will call back next week

## 2013-07-14 ENCOUNTER — Ambulatory Visit (HOSPITAL_COMMUNITY)
Admission: RE | Admit: 2013-07-14 | Discharge: 2013-07-14 | Disposition: A | Discharge status: Home / Self Care | Payer: Medicare Other | Source: Ambulatory Visit | Attending: Internal Medicine | Admitting: Internal Medicine

## 2013-07-14 DIAGNOSIS — D509 Iron deficiency anemia, unspecified: Secondary | ICD-10-CM | POA: Insufficient documentation

## 2013-07-14 DIAGNOSIS — I10 Essential (primary) hypertension: Secondary | ICD-10-CM | POA: Insufficient documentation

## 2013-07-14 MED ORDER — ZOLEDRONIC ACID 5 MG/100ML IV SOLN
INTRAVENOUS | Status: AC
  Filled 2013-07-14: qty 100

## 2013-07-14 MED ORDER — ZOLEDRONIC ACID 5 MG/100ML IV SOLN
5.0000 mg | Freq: Once | INTRAVENOUS | Status: AC
  Administered 2013-07-14: 5 mg via INTRAVENOUS

## 2013-09-08 ENCOUNTER — Encounter: Payer: Self-pay | Admitting: Internal Medicine

## 2013-09-08 ENCOUNTER — Ambulatory Visit (INDEPENDENT_AMBULATORY_CARE_PROVIDER_SITE_OTHER): Payer: Medicare Other | Admitting: Internal Medicine

## 2013-09-08 VITALS — BP 120/70 | HR 72 | Temp 98.2°F | Ht 65.0 in | Wt 108.0 lb

## 2013-09-08 DIAGNOSIS — I10 Essential (primary) hypertension: Secondary | ICD-10-CM

## 2013-09-08 DIAGNOSIS — E46 Unspecified protein-calorie malnutrition: Secondary | ICD-10-CM

## 2013-09-08 DIAGNOSIS — D509 Iron deficiency anemia, unspecified: Secondary | ICD-10-CM

## 2013-09-08 DIAGNOSIS — Z23 Encounter for immunization: Secondary | ICD-10-CM

## 2013-09-08 NOTE — Progress Notes (Signed)
Pre visit review using our clinic review tool, if applicable. No additional management support is needed unless otherwise documented below in the visit note. 

## 2013-09-08 NOTE — Progress Notes (Signed)
Subjective:    Patient ID: Diane Little, female    DOB: 22-Jan-1925, 80 y.o.   MRN: [id number]  HPI Monitoring for weight loss and HTN Blood pressure did  Not go up with lowering of the amlodipine and high fall rish weigth loss noted but not past low point  Still taking boost but has memory issues   Review of Systems  Constitutional: Positive for activity change, appetite change, fatigue and unexpected weight change.  HENT: Negative.   Eyes: Negative.   Respiratory: Negative.   Genitourinary: Positive for urgency.       Nocturia   Past Medical History  Diagnosis Date  . Allergy   . Anemia   . Hypertension   . Osteoporosis   . Nail fungus   . Rash   . Diverticulosis   . Internal hemorrhoids   . Osteoporosis with fracture     History   Social History  . Marital Status: Single    Spouse Name: N/A    Number of Children: 2  . Years of Education: N/A   Occupational History  . retired    Social History Main Topics  . Smoking status: Never Smoker   . Smokeless tobacco: Never Used  . Alcohol Use: No  . Drug Use: No  . Sexual Activity: Not Currently   Other Topics Concern  . Not on file   Social History Narrative  . No narrative on file    Past Surgical History  Procedure Laterality Date  . Replacement total knee Right   . Tonsillectomy    . Cataract extraction Bilateral   . Hip arthroplasty Left 06/28/2012    Procedure: Left HIP HEMI ARTHROPLASTY;  Surgeon: Mauri Pole, MD;  Location: [facility];  Service: Orthopedics;  Laterality: Left;    Family History  Problem Relation Age of Onset  . Heart disease Mother     Allergies  Allergen Reactions  . Fish Allergy Rash    ALL SEAFOOD  . Pneumococcal Vaccine Polyvalent   . Ramipril     REACTION: rash    Current Outpatient Prescriptions on File Prior to Visit  Medication Sig Dispense Refill  . acetaminophen (TYLENOL) 650 MG CR tablet Take 650 mg by mouth every 8 (eight) hours as needed for pain.        . calcium-vitamin D (OSCAL WITH D 500-200) 500-200 MG-UNIT per tablet Take 1 tablet by mouth 2 (two) times daily.        . feeding supplement (BOOST HIGH PROTEIN) LIQD Take 1 Container by mouth 2 (two) times daily between meals.      . ferrous sulfate 325 (65 FE) MG tablet Take 1 tablet (325 mg total) by mouth daily with breakfast.  30 tablet  1  . fesoterodine (TOVIAZ) 8 MG TB24 tablet Take 1 tablet (8 mg total) by mouth daily.  30 tablet  11  . fluticasone (FLONASE) 50 MCG/ACT nasal spray Place 2 sprays into the nose daily as needed.  16 g  5  . loratadine (CLARITIN) 10 MG tablet TAKE 1 TABLET BY MOUTH DAILY  90 tablet  3  . Misc Natural Products (OSTEO BI-FLEX ADV DOUBLE ST PO) Take 1 tablet by mouth daily.       . Multiple Vitamin (MULTIVITAMIN WITH MINERALS) TABS Take 1 tablet by mouth daily.      . polyethylene glycol (MIRALAX / GLYCOLAX) packet Take 17 g by mouth daily as needed.  14 each  0  . pyridoxine (B-6)  50 MG tablet Take 50 mg by mouth daily.        . vitamin B-12 (CYANOCOBALAMIN) 250 MCG tablet Take 250 mcg by mouth daily.        Marland Kitchen zinc gluconate 50 MG tablet Take 50 mg by mouth daily.        . [DISCONTINUED] oxybutynin (DITROPAN) 5 MG tablet Take 1 tablet (5 mg total) by mouth 2 (two) times daily.  60 tablet  11   No current facility-administered medications on file prior to visit.    BP 120/70  Pulse 72  Temp(Src) 98.2 F (36.8 C) (Oral)  Ht 5\' 5"  (1.651 m)  Wt 108 lb (48.988 kg)  BMI 17.97 kg/m2       Objective:   Physical Exam  Vitals reviewed. Constitutional: No distress.  HENT:  Head: Normocephalic and atraumatic.  Eyes: Conjunctivae are normal. Pupils are equal, round, and reactive to light.  Neck: Normal range of motion. Neck supple. No tracheal deviation present. No thyromegaly present.  Cardiovascular: Regular rhythm.   Murmur heard. Pulmonary/Chest: Effort normal and breath sounds normal.  Abdominal: Soft. Bowel sounds are normal.  Skin: She is  not diaphoretic.          Assessment & Plan:  HTN with weight loss I do not belive she requires an BP meds and the risk of fall is to great if she become hypotensive Stop Norvasc  TD today and prevnar  Weight is 120  ( low was 105) Keep on the supplements ( boost)   Add protein cal malutrition

## 2013-09-08 NOTE — Patient Instructions (Signed)
Stop the amlodoipine

## 2013-09-08 NOTE — Addendum Note (Signed)
Addended by: Townsend Roger D on: 09/08/2013 12:35 PM   Modules accepted: Orders

## 2013-09-09 ENCOUNTER — Telehealth: Payer: Self-pay | Admitting: Internal Medicine

## 2013-09-09 NOTE — Telephone Encounter (Signed)
Relevant patient education assigned to patient using Emmi. ° °

## 2013-12-19 IMAGING — CT CT ABD-PELV W/ CM
2 of 5 series · 15 of 36 positions shown, 18 images · IV contrast (omnipaque)
Comparison: None.

CT CHEST

CLINICAL DATA: Heme positive stool.  Anemia. Unexplained weight
loss.

CT CHEST, ABDOMEN AND PELVIS WITH CONTRAST
TECHNIQUE: Multidetector CT imaging of the chest, abdomen and
pelvis was performed following the standard protocol during bolus
administration of intravenous contrast.
Contrast: 80mL OMNIPAQUE IOHEXOL 300 MG/ML  SOLN,

[Series 2: cap with · axial · 0.57mm/px · z∈[-506,+9]mm · 12 of 119 slices shown, 15 images]
[im 8/119  mediastinal]
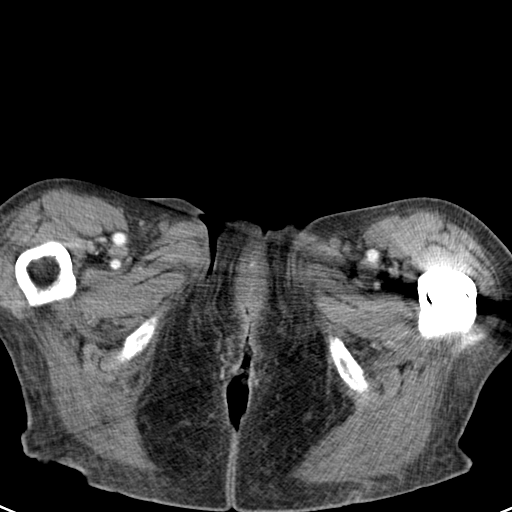
[im 8/119  lung]
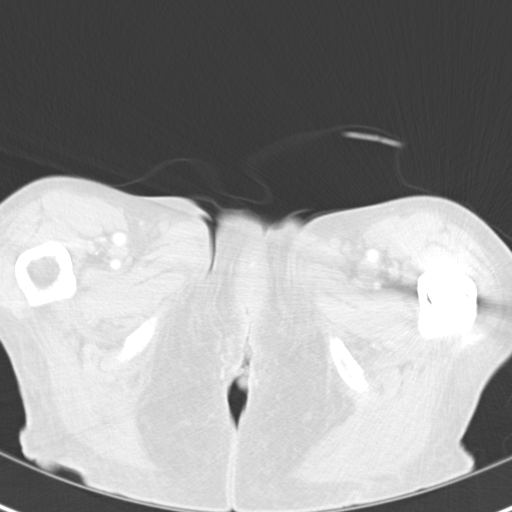
[im 15/119  lung]
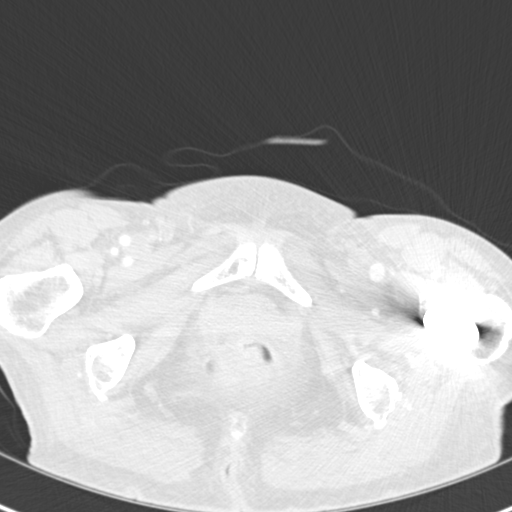
[im 30/119  lung]
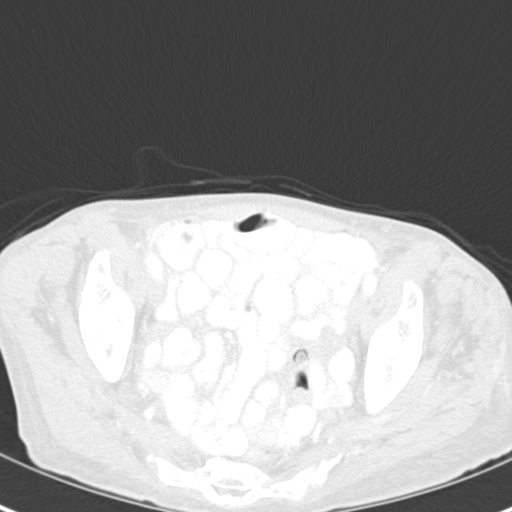
[im 37/119  lung]
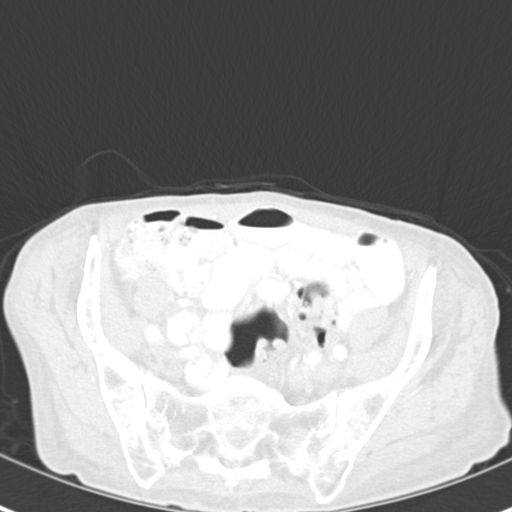
[im 45/119  mediastinal]
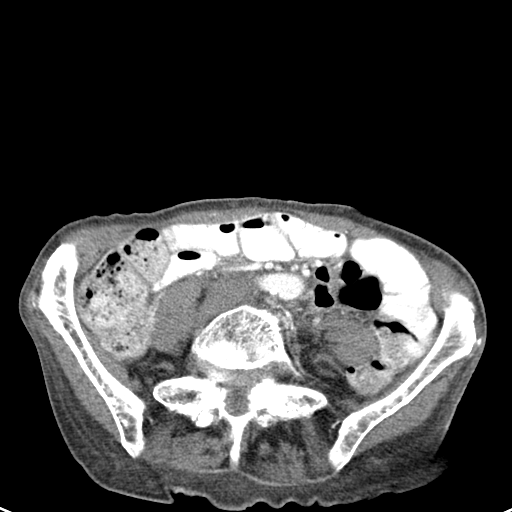
[im 45/119  lung]
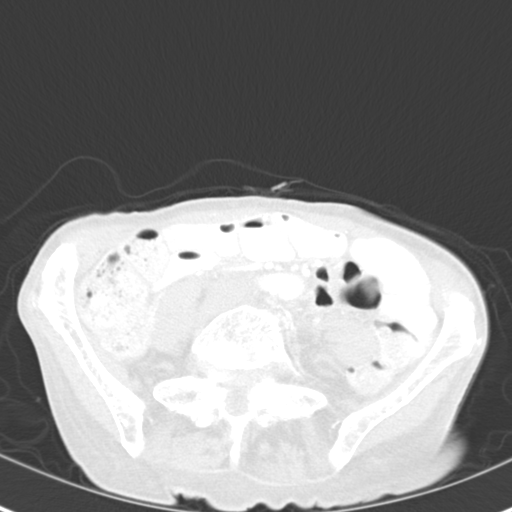
[im 52/119  lung]
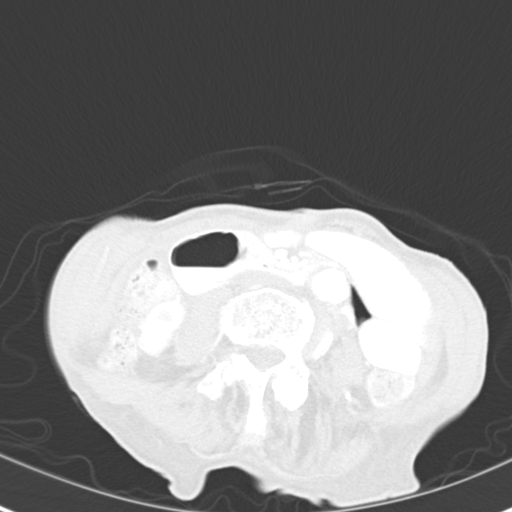
[im 67/119  lung]
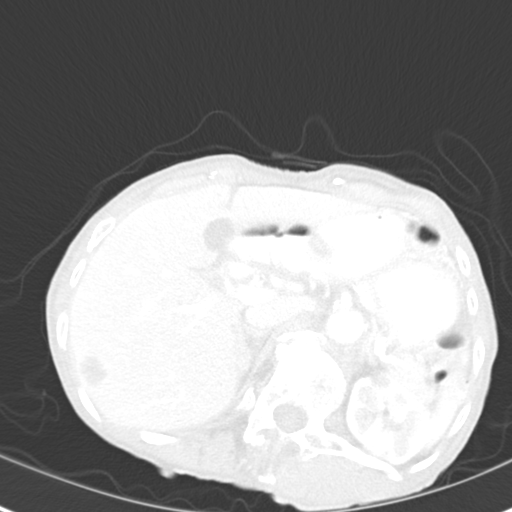
[im 74/119  lung]
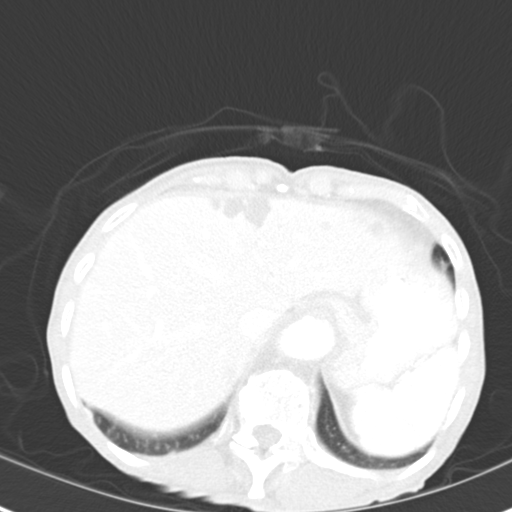
[im 82/119  mediastinal]
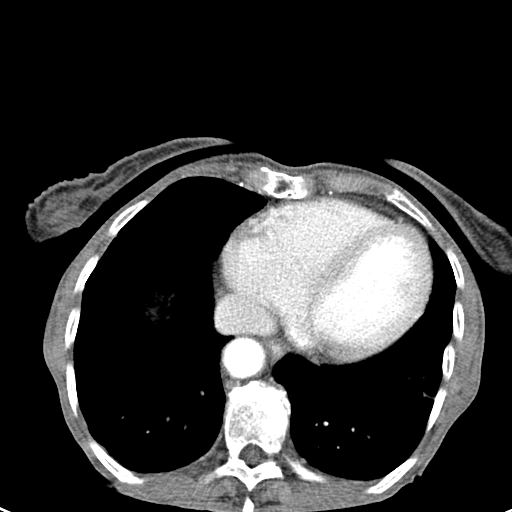
[im 82/119  lung]
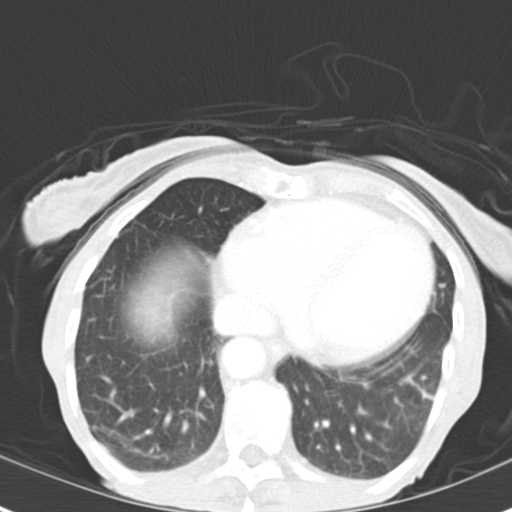
[im 89/119  lung]
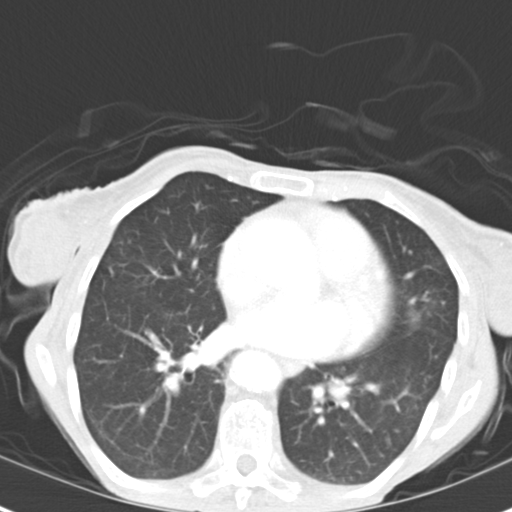
[im 104/119  lung]
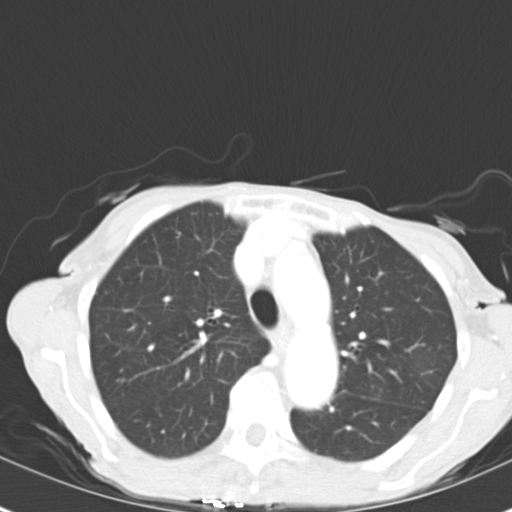
[im 111/119  lung]
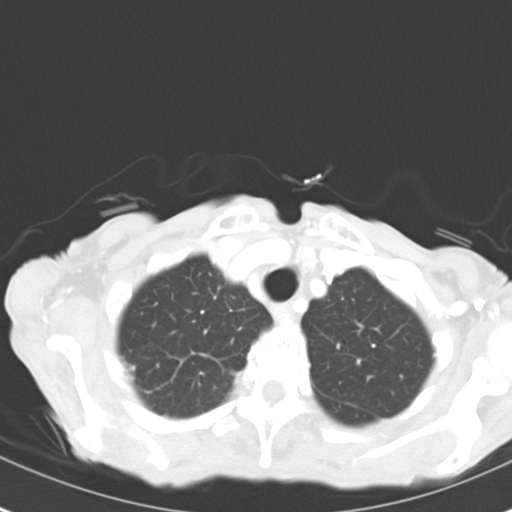

[Series 602: cor · coronal · 1.20mm/px · 3 of 94 slices shown]
[im 19/94  lung]
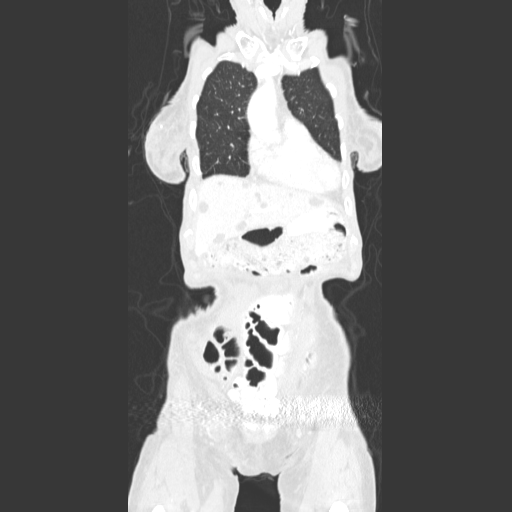
[im 38/94  lung]
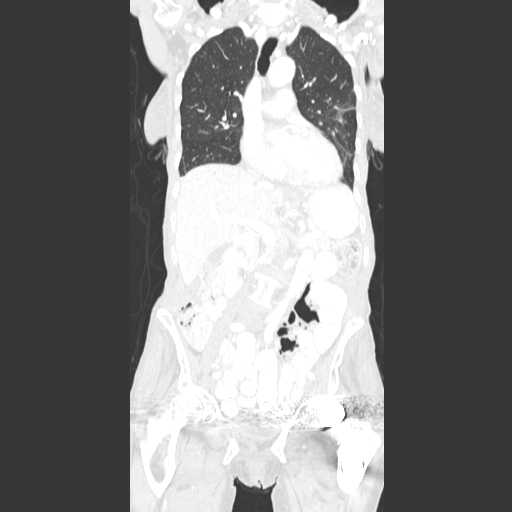
[im 56/94  lung]
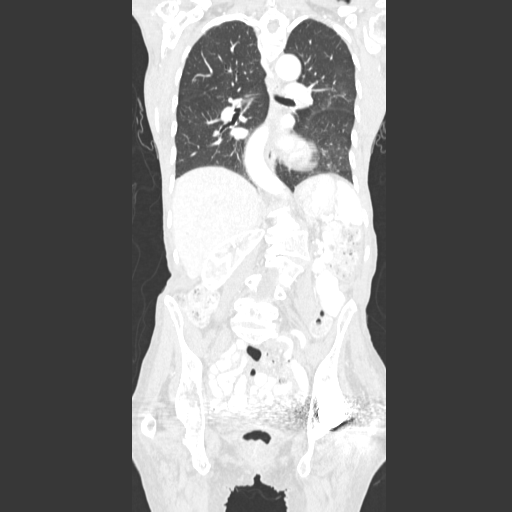

[15 of 36 positions shown; findings below may reference images not displayed]

FINDINGS: No evidence of mediastinal or hilar masses.  No
lymphadenopathy seen elsewhere within the thorax.  No evidence of
chest wall mass or suspicious bone lesions.

No evidence of pleural or pericardial effusion.  Pulmonary
hyperinflation is consistent with COPD.  Mild scarring is seen in
both lung bases, however there is no evidence of pulmonary air
space disease or central endobronchial lesion.  No suspicious
pulmonary nodules or masses are identified.
IMPRESSION: 1.  COPD and mild bilateral scarring.
2.  No active disease within the thorax.

CT ABDOMEN AND PELVIS
FINDINGS: Multiple small hepatic cysts are noted, but no liver
masses are identified.  Gallbladder is unremarkable there is no
evidence of biliary ductal dilatation.

A small renal cysts are seen bilaterally, but no complex cystic or
solid renal masses are identified.  No evidence of hydronephrosis.
The spleen, pancreas, and adrenal glands are normal appearance.

No soft tissue masses or lymphadenopathy seen elsewhere within the
abdomen or pelvis.  The left hip prosthesis results in beam
hardening artifact through the inferior pelvis.  No evidence of
inflammatory process or abnormal fluid collections.  No evidence of
bowel wall thickening, dilatation, or hernia. Diverticulosis is
seen involving the sigmoid colon, however there is no evidence of
diverticulitis.  Severe thoracolumbar levoscoliosis and
degenerative changes noted, but no suspicious bone lesions are
identified.
IMPRESSION: 1.  No evidence of neoplasm or other acute findings.

2.  Diverticulosis.  No radiographic evidence of diverticulitis.
3. Thoracolumbar levoscoliosis and degenerative changes.

## 2014-01-08 ENCOUNTER — Telehealth: Payer: Self-pay | Admitting: Internal Medicine

## 2014-01-08 NOTE — Telephone Encounter (Addendum)
Vito Backers is a NP w/ [employer].  She saw pt today and has some concerns.  She states she knows pt has appt tomorrow and she has some concerns. Pt had a mass in right groin area. Tender to touch. Pt has lost weight. Pt has dementia and possibly progressing.  NP states she is still driving. Pt has piles and piles of papers. Drinking 2 Boost /a day. She not sure pt is eating well b/c she has lost weight. Yesterday was the first time she had seen pt.  Pt has appt w/ Dr Maudie Mercury,  Friday, 9/4 at 10 am Cassandra states you can call her with any questions on vell phone

## 2014-01-09 ENCOUNTER — Encounter: Payer: Self-pay | Admitting: Family Medicine

## 2014-01-09 ENCOUNTER — Encounter: Payer: Medicare Other | Admitting: Family Medicine

## 2014-01-09 ENCOUNTER — Ambulatory Visit (INDEPENDENT_AMBULATORY_CARE_PROVIDER_SITE_OTHER): Payer: Medicare Other | Admitting: Family Medicine

## 2014-01-09 ENCOUNTER — Ambulatory Visit: Payer: Medicare Other | Admitting: Family Medicine

## 2014-01-09 VITALS — BP 112/62 | HR 75 | Temp 98.0°F | Ht 65.0 in | Wt 107.0 lb

## 2014-01-09 DIAGNOSIS — I10 Essential (primary) hypertension: Secondary | ICD-10-CM

## 2014-01-09 DIAGNOSIS — R1909 Other intra-abdominal and pelvic swelling, mass and lump: Secondary | ICD-10-CM

## 2014-01-09 DIAGNOSIS — D508 Other iron deficiency anemias: Secondary | ICD-10-CM

## 2014-01-09 DIAGNOSIS — R634 Abnormal weight loss: Secondary | ICD-10-CM

## 2014-01-09 DIAGNOSIS — R413 Other amnesia: Secondary | ICD-10-CM

## 2014-01-09 DIAGNOSIS — Z23 Encounter for immunization: Secondary | ICD-10-CM

## 2014-01-09 LAB — CBC WITH DIFFERENTIAL/PLATELET
BASOS ABS: 0 10*3/uL (ref 0.0–0.1)
Basophils Relative: 0.8 % (ref 0.0–3.0)
EOS ABS: 0 10*3/uL (ref 0.0–0.7)
Eosinophils Relative: 0.7 % (ref 0.0–5.0)
HEMATOCRIT: 33.5 % — AB (ref 36.0–46.0)
HEMOGLOBIN: 11.3 g/dL — AB (ref 12.0–15.0)
LYMPHS ABS: 0.9 10*3/uL (ref 0.7–4.0)
Lymphocytes Relative: 15.1 % (ref 12.0–46.0)
MCHC: 33.9 g/dL (ref 30.0–36.0)
MCV: 97.8 fl (ref 78.0–100.0)
MONOS PCT: 10.4 % (ref 3.0–12.0)
Monocytes Absolute: 0.6 10*3/uL (ref 0.1–1.0)
NEUTROS ABS: 4.3 10*3/uL (ref 1.4–7.7)
Neutrophils Relative %: 73 % (ref 43.0–77.0)
Platelets: 169 10*3/uL (ref 150.0–400.0)
RBC: 3.42 Mil/uL — ABNORMAL LOW (ref 3.87–5.11)
RDW: 13 % (ref 11.5–15.5)
WBC: 5.9 10*3/uL (ref 4.0–10.5)

## 2014-01-09 LAB — BASIC METABOLIC PANEL
BUN: 17 mg/dL (ref 6–23)
CALCIUM: 9.6 mg/dL (ref 8.4–10.5)
CO2: 31 mEq/L (ref 19–32)
CREATININE: 0.8 mg/dL (ref 0.4–1.2)
Chloride: 103 mEq/L (ref 96–112)
GFR: 75.39 mL/min (ref >60.00)
Glucose, Bld: 123 mg/dL — ABNORMAL HIGH (ref 70–99)
Potassium: 4.1 mEq/L (ref 3.5–5.1)
Sodium: 138 mEq/L (ref 135–145)

## 2014-01-09 NOTE — Progress Notes (Signed)
error    This encounter was created in error - please disregard.

## 2014-01-09 NOTE — Telephone Encounter (Signed)
Arville Go, please get some info on who called and from where? [place] office? If so what specialtyy? What doctor? I have never seen pt before and just seeing pt for follow up today but appears she is seeing GI and had EXTENSIVE workup for the weight loss. If has dementia this may be the cause. I'll talk with her and maybe have her see neurologist.

## 2014-01-09 NOTE — Progress Notes (Signed)
Pre visit review using our clinic review tool, if applicable. No additional management support is needed unless otherwise documented below in the visit note. 

## 2014-01-09 NOTE — Telephone Encounter (Signed)
Dr Maudie Mercury is aware and will be seeing the pt today.

## 2014-01-09 NOTE — Progress Notes (Addendum)
No chief complaint on file.   HPI:  Diane Little is an 80 yo prior patient of Dr. Arnoldo Little here for a med check: Reports she is feeling fine. Reports son lives next door. She goes to church every Sunday. She reports she eats out for most of her meals - gets 3 meals per day. Brings life screen report from July  HTN: -stopped norvasc last vist per notes due to lower BP -reports: doing fine -denies: CP, SOB, DOE, swelling  Iron def anemia, weight loss: -per PCP notes, extensive workup with GI, s/p CT c/a/p, colonoscopy -etiology unknown -taking boost and iron -on lots of supplements -eats cereal and banana and coffee for breakfast, frozen meal for lunch and eats out for dinner -denies: anorexia, further weight loss, fatigue or malaise  Overactive bladder: -meds include fesoterodine and oxybutynin  Insurance Nurse had ? Dementia/Lump in R groin: -pt reports does her own shopping, meal prep, bathing, dressing -son lives next door -she reports mild memory loss -pt report mass in groin, non-tender there for a long time -reports she does have issues with misplacing items - but this is rare, feels safe at home, no depression, no falls -however she had trouble finding this office today and reports she has been here many times -no problems remembering how to get places  -reports vision and hearing are good  ROS: See pertinent positives and negatives per HPI.  Past Medical History  Diagnosis Date  . Allergy   . Anemia   . Hypertension   . Osteoporosis   . Nail fungus   . Rash   . Diverticulosis   . Internal hemorrhoids   . Osteoporosis with fracture     Past Surgical History  Procedure Laterality Date  . Replacement total knee Right   . Tonsillectomy    . Cataract extraction Bilateral   . Hip arthroplasty Left 06/28/2012    Procedure: Left HIP HEMI ARTHROPLASTY;  Surgeon: Mauri Pole, MD;  Location: [facility];  Service: Orthopedics;  Laterality: Left;    Family  History  Problem Relation Age of Onset  . Heart disease Mother     History   Social History  . Marital Status: Single    Spouse Name: N/A    Number of Children: 2  . Years of Education: N/A   Occupational History  . retired    Social History Main Topics  . Smoking status: Never Smoker   . Smokeless tobacco: Never Used  . Alcohol Use: No  . Drug Use: No  . Sexual Activity: Not Currently   Other Topics Concern  . None   Social History Narrative  . None    Current outpatient prescriptions:acetaminophen (TYLENOL) 650 MG CR tablet, Take 650 mg by mouth every 8 (eight) hours as needed for pain. , Disp: , Rfl: ;  calcium-vitamin D (OSCAL WITH D 500-200) 500-200 MG-UNIT per tablet, Take 1 tablet by mouth 2 (two) times daily.  , Disp: , Rfl: ;  feeding supplement (BOOST HIGH PROTEIN) LIQD, Take 1 Container by mouth 2 (two) times daily between meals., Disp: , Rfl:  ferrous sulfate 325 (65 FE) MG tablet, Take 1 tablet (325 mg total) by mouth daily with breakfast., Disp: 30 tablet, Rfl: 1;  fesoterodine (TOVIAZ) 8 MG TB24 tablet, Take 1 tablet (8 mg total) by mouth daily., Disp: 30 tablet, Rfl: 11;  fluticasone (FLONASE) 50 MCG/ACT nasal spray, Place 2 sprays into the nose daily as needed., Disp: 16 g, Rfl: 5;  loratadine (CLARITIN) 10 MG tablet, TAKE 1 TABLET BY MOUTH DAILY, Disp: 90 tablet, Rfl: 3 Misc Natural Products (OSTEO BI-FLEX ADV DOUBLE ST PO), Take 1 tablet by mouth daily. , Disp: , Rfl: ;  Multiple Vitamin (MULTIVITAMIN WITH MINERALS) TABS, Take 1 tablet by mouth daily., Disp: , Rfl: ;  polyethylene glycol (MIRALAX / GLYCOLAX) packet, Take 17 g by mouth daily as needed., Disp: 14 each, Rfl: 0;  pyridoxine (B-6) 50 MG tablet, Take 50 mg by mouth daily.  , Disp: , Rfl:  vitamin B-12 (CYANOCOBALAMIN) 250 MCG tablet, Take 250 mcg by mouth daily.  , Disp: , Rfl: ;  zinc gluconate 50 MG tablet, Take 50 mg by mouth daily.  , Disp: , Rfl: ;  [DISCONTINUED] oxybutynin (DITROPAN) 5 MG tablet,  Take 1 tablet (5 mg total) by mouth 2 (two) times daily., Disp: 60 tablet, Rfl: 11  EXAM:  Filed Vitals:   01/09/14 1404  BP: 112/62  Pulse: 75  Temp: 98 F (36.7 C)    Body mass index is 17.81 kg/(m^2).  GENERAL: vitals reviewed and listed above, alert, oriented, appears well hydrated and in no acute distress - she looks good with her hair and clothes clean and well kept  HEENT: atraumatic, visual acuity grossly intact, hearing grossly intact, conjunttiva clear, no obvious abnormalities on inspection of external nose and ears  NECK: no obvious masses on inspection  LUNGS: clear to auscultation bilaterally, no wheezes, rales or rhonchi, good air movement  CV: HRRR, no peripheral edema  MS: moves all extremities without noticeable abnormality  GROIN/LYMPH: large firm, mobile, non tender 2 x 1.5 cm subcutaneous mass in R groin, no erythema or pain. No other LAD noted on exam.  PSYCH: pleasant and cooperative, no obvious depression or anxiety Mini cog: 3/3 words repeat, 3/3 words recalled, clock draw normal   ASSESSMENT AND PLAN:  Discussed the following assessment and plan:  Memory loss - Plan: CANCELED: Ambulatory referral to Neurology -discussed, mini - cog ok, she opted for eval with neurology, referral placed  Essential hypertension - Plan: Basic metabolic panel -stable off medication  Groin mass in female  -we discussed possible serious and likely etiologies including but not limited to infection and cancer vs other, workup and treatment, treatment risks and return precautions -I advised further eval with imaging and/or surgery consult given my concerns -after this discussion, Diane Little opted for no evaluation - not interested in work up for this at this time - declined imaging or surgical eval -follow up advised with new PCP - she opted to see Diane Little  -of course, we advised Kiyoko  to return or notify a doctor immediately if symptoms worsen or persist or new  concerns arise.  Other iron deficiency anemias - Plan: CBC with Differential  Loss of weight - Plan: Basic metabolic panel -wt stable  Advised of choices for new PCP here. She opted to see Diane Little and advised to schedule NPV.  -Patient advised to return or notify a doctor immediately if symptoms worsen or persist or new concerns arise.  Patient Instructions  -continue 3 meals per day and boost  -schedule new patient visit   -let us know if you want further evaluation of the lump in your groin area  --We have ordered labs or studies at this visit. It can take up to 1-2 weeks for results and processing. We will contact you with instructions IF your results are abnormal. Normal results will be released to your [facility]. If  you have not heard from [place] or can not find your results in [facility] in 2 weeks please contact our office.            Colin Benton R.

## 2014-01-09 NOTE — Addendum Note (Signed)
Addended by: Agnes Lawrence on: 01/09/2014 03:06 PM   Modules accepted: Orders

## 2014-01-09 NOTE — Patient Instructions (Signed)
-  continue 3 meals per day and boost  -schedule new patient visit   -let us know if you want further evaluation of the lump in your groin area  --We have ordered labs or studies at this visit. It can take up to 1-2 weeks for results and processing. We will contact you with instructions IF your results are abnormal. Normal results will be released to your [facility]. If you have not heard from [place] or can not find your results in [facility] in 2 weeks please contact our office.

## 2014-02-03 ENCOUNTER — Other Ambulatory Visit: Payer: Self-pay | Admitting: Internal Medicine

## 2014-02-03 NOTE — Telephone Encounter (Signed)
I am not the PCP. Seeing Dr. Yong Channel to establish. Did see her for med check so will refill to her appt with Dr. Yong Channel.  Derinda Late, this is one of the examples where I am listed as PCP, but not sure why. Can we change and if you can see who made me PCP - please ask that I NOT be listed as PCP until seen for establish care/transfer visit with me or with my approval otherwise.Thanks.

## 2014-03-02 ENCOUNTER — Telehealth: Payer: Self-pay | Admitting: Internal Medicine

## 2014-03-02 MED ORDER — OXYBUTYNIN CHLORIDE 5 MG PO TABS: ORAL_TABLET | ORAL | Status: DC

## 2014-03-02 NOTE — Telephone Encounter (Signed)
[id number] - [place], [place] - [address] AT [address] is requesting re-fill on oxybutynin (DITROPAN) 5 MG tablet

## 2014-03-02 NOTE — Telephone Encounter (Signed)
rx sent in electronically 

## 2014-05-27 ENCOUNTER — Ambulatory Visit (INDEPENDENT_AMBULATORY_CARE_PROVIDER_SITE_OTHER): Payer: Medicare Other | Admitting: Family Medicine

## 2014-05-27 ENCOUNTER — Encounter: Payer: Self-pay | Admitting: Family Medicine

## 2014-05-27 VITALS — BP 120/70 | Temp 98.7°F | Wt 107.0 lb

## 2014-05-27 DIAGNOSIS — M810 Age-related osteoporosis without current pathological fracture: Secondary | ICD-10-CM

## 2014-05-27 DIAGNOSIS — IMO0001 Reserved for inherently not codable concepts without codable children: Secondary | ICD-10-CM | POA: Insufficient documentation

## 2014-05-27 DIAGNOSIS — N3281 Overactive bladder: Secondary | ICD-10-CM | POA: Diagnosis not present

## 2014-05-27 DIAGNOSIS — E46 Unspecified protein-calorie malnutrition: Secondary | ICD-10-CM | POA: Diagnosis not present

## 2014-05-27 DIAGNOSIS — D509 Iron deficiency anemia, unspecified: Secondary | ICD-10-CM

## 2014-05-27 NOTE — Patient Instructions (Addendum)
Glad you are no longer losing weight. Keep up the boost.   See me in 6 months and we will check your blood counts again, i suspect they are stable.   If you ever had another fracture, we may need to reconsider a medication like fosamax to restart. For now, continue off of medication.   Glad your bladder symptoms are doing well on the 2 medicines. They do put you at risk for falls so may need to consider coming off of 1 of them if this ever occurs.

## 2014-05-27 NOTE — Progress Notes (Signed)
Diane Reddish, Diane Little Phone: [phone]  Subjective:  Patient presents today to establish care with me as their new primary care provider. Patient was formerly a patient of Dr. Arnoldo Morale. Chief complaint-noted.   Iron deficiency anemia-improving weight loss-resolved Patient has history of Improving on iron. Colonoscopy 2014 with polyps but no malignancy. Unclear etiology. This was also part of a workup for unintentional weight loss that included CT abd/pelvis under GI's care from which she has been released. Fortunately, her weight loss has stabilized on daily or twice daily boost. Denies worsening fatigue, night sweats.  ROS- no brbpr, melena, hemoptysis, hematuria  Osteoporosis-presumed stable 11/05/2009 -2.5 on 1 femur, treated with fosamax for several years (not clear total #). Now calcium vit D.  ROS- no recent fractures or bone pain or low back pain  Overactive bladder-stbale Patient was previously under the care of Dr. Arnoldo Morale and was on both toviaz and oxybutynin. This seems to be the only treatment that has controlled her symptoms ROS-mild dry mouth, no falls or confusion  The following were reviewed and entered/updated in epic: Past Medical History  Diagnosis Date  . Allergy   . Anemia   . Hypertension   . Osteoporosis   . Nail fungus   . Rash   . Diverticulosis   . Internal hemorrhoids   . Osteoporosis with fracture   . HYPERTENSION 12/17/2006    no rx, former  . Hip fracture, left 06/26/2012  . TOTAL KNEE REPLACEMENT, RIGHT, HX OF 06/10/2007    r   Patient Active Problem List   Diagnosis Date Noted  . Protein-calorie malnutrition 09/08/2013    Priority: Medium  . Overactive bladder 02/28/2012    Priority: Medium  . Iron deficiency anemia 12/17/2006    Priority: Medium  . Osteoporosis 12/17/2006    Priority: Medium  . Aortic sclerosis 05/27/2014    Priority: Low  . Total knee replacement status 06/10/2007    Priority: Low  . Allergic rhinitis 12/17/2006   Priority: Low   Past Surgical History  Procedure Laterality Date  . Replacement total knee Right   . Tonsillectomy    . Cataract extraction Bilateral   . Hip arthroplasty Left 06/28/2012    Procedure: Left HIP HEMI ARTHROPLASTY;  Surgeon: Mauri Pole, Diane Little;  Location: [facility];  Service: Orthopedics;  Laterality: Left;    Family History  Problem Relation Age of Onset  . Heart disease Mother     Medications- reviewed and updated Current Outpatient Prescriptions  Medication Sig Dispense Refill  . calcium-vitamin D (OSCAL WITH D 500-200) 500-200 MG-UNIT per tablet Take 1 tablet by mouth 2 (two) times daily.      . feeding supplement (BOOST HIGH PROTEIN) LIQD Take 1 Container by mouth 2 (two) times daily between meals.    . ferrous sulfate 325 (65 FE) MG tablet Take 1 tablet (325 mg total) by mouth daily with breakfast. 30 tablet 1  . fesoterodine (TOVIAZ) 8 MG TB24 tablet Take 1 tablet (8 mg total) by mouth daily. 30 tablet 11  . loratadine (CLARITIN) 10 MG tablet TAKE 1 TABLET BY MOUTH DAILY 90 tablet 3  . Misc Natural Products (OSTEO BI-FLEX ADV DOUBLE ST PO) Take 1 tablet by mouth daily.     . Multiple Vitamin (MULTIVITAMIN WITH MINERALS) TABS Take 1 tablet by mouth daily.    Marland Kitchen oxybutynin (DITROPAN) 5 MG tablet TAKE 1 TABLET BY MOUTH TWICE DAILY 60 tablet 5  . pyridoxine (B-6) 50 MG tablet Take 50 mg by mouth  daily.      . vitamin B-12 (CYANOCOBALAMIN) 250 MCG tablet Take 250 mcg by mouth daily.      Marland Kitchen zinc gluconate 50 MG tablet Take 50 mg by mouth daily.      Marland Kitchen acetaminophen (TYLENOL) 650 MG CR tablet Take 650 mg by mouth every 8 (eight) hours as needed for pain.     . fluticasone (FLONASE) 50 MCG/ACT nasal spray Place 2 sprays into the nose daily as needed. (Patient not taking: Reported on 05/27/2014) 16 g 5  . polyethylene glycol (MIRALAX / GLYCOLAX) packet Take 17 g by mouth daily as needed. (Patient not taking: Reported on 05/27/2014) 14 each 0  . [DISCONTINUED] oxybutynin  (DITROPAN) 5 MG tablet Take 1 tablet (5 mg total) by mouth 2 (two) times daily. 60 tablet 11   No current facility-administered medications for this visit.    Allergies-reviewed and updated Allergies  Allergen Reactions  . Fish Allergy Rash    ALL SEAFOOD  . Pneumococcal Vaccine Polyvalent   . Ramipril     REACTION: rash    History   Social History  . Marital Status: Single    Spouse Name: N/A    Number of Children: 2  . Years of Education: N/A   Occupational History  . retired    Social History Main Topics  . Smoking status: Never Smoker   . Smokeless tobacco: Never Used  . Alcohol Use: No  . Drug Use: No  . Sexual Activity: Not Currently   Other Topics Concern  . None   Social History Narrative   Lives alone. Son lives next door.    Widowed. 2 sons (both in are), Step grandchildren      Retired from [employer]       Hobbies: some bus trips with friends, time with friends    ROS--See HPI   Objective: BP 120/70 mmHg  Temp(Src) 98.7 F (37.1 C)  Wt 107 lb (48.535 kg) Gen: NAD, resting comfortably Slightly dry mucus membranes no pallor HEENT: Mucous membranes are moist. Oropharynx normal. Many missing teeth.  CV: RRR 3/6 SEM heard best RUSB, no rubs or gallops Lungs: CTAB no crackles, wheeze, rhonchi Abdomen: soft/nontender/nondistended/normal bowel sounds. No rebound or guarding.  Ext: no edema Skin: warm, dry, no rash  Neuro: grossly normal, moves all extremities, PERRLA   Assessment/Plan:  Iron deficiency anemia Improving hemoglobin on iron, unclear etiology (see HPI 05/28/14). Opted for repeat CBC in follow up.    Osteoporosis Discussed typically would treat for at least 5 years, patient is not interested in this time in taking fosamax again though states if she had a fracture she would reconsider. She does not want repeat testing at this time either. Counseled on risks. Continue calcium-vit D   Overactive bladder Had discussion  with patient about the risks of 2 anticholinergics. Her urinary symptoms have been well controlled on this regimen and with only mild dry mouth side effect. She would like to continue. I agreed at this time but will need to continue conversation in future. Hopeful we could remove at least one medication and if cost effective consider myrbetriq   Protein-calorie malnutrition Weight loss evaluated by GI 09/2012 with no cause (CT abd/pelvis and colonsocopy) and return to PCP. Weight has finally stabilized over last year with boost 1-2x a day and will continue    Return precautions advised. Patient desires 6 month follow up.

## 2014-05-28 NOTE — Assessment & Plan Note (Signed)
Had discussion with patient about the risks of 2 anticholinergics. Her urinary symptoms have been well controlled on this regimen and with only mild dry mouth side effect. She would like to continue. I agreed at this time but will need to continue conversation in future. Hopeful we could remove at least one medication and if cost effective consider myrbetriq

## 2014-05-28 NOTE — Assessment & Plan Note (Signed)
Weight loss evaluated by GI 09/2012 with no cause (CT abd/pelvis and colonsocopy) and return to PCP. Weight has finally stabilized over last year with boost 1-2x a day and will continue

## 2014-05-28 NOTE — Assessment & Plan Note (Addendum)
Improving hemoglobin on iron, unclear etiology (see HPI 05/28/14). Opted for repeat CBC in follow up.

## 2014-05-28 NOTE — Assessment & Plan Note (Addendum)
Discussed typically would treat for at least 5 years, patient is not interested in this time in taking fosamax again though states if she had a fracture she would reconsider. She does not want repeat testing at this time either. Counseled on risks. Continue calcium-vit D

## 2014-07-13 ENCOUNTER — Encounter: Payer: Self-pay | Admitting: Family Medicine

## 2014-07-13 ENCOUNTER — Ambulatory Visit (INDEPENDENT_AMBULATORY_CARE_PROVIDER_SITE_OTHER): Payer: Medicare Other | Admitting: Family Medicine

## 2014-07-13 VITALS — BP 140/78 | HR 74 | Temp 98.8°F | Ht 63.0 in | Wt 100.0 lb

## 2014-07-13 DIAGNOSIS — F0280 Dementia in other diseases classified elsewhere without behavioral disturbance: Secondary | ICD-10-CM | POA: Insufficient documentation

## 2014-07-13 DIAGNOSIS — R413 Other amnesia: Secondary | ICD-10-CM

## 2014-07-13 DIAGNOSIS — G309 Alzheimer's disease, unspecified: Secondary | ICD-10-CM

## 2014-07-13 NOTE — Patient Instructions (Signed)
Memory Loss  Refer to neurology for further evaluation  No driving for now due to safety concerns

## 2014-07-13 NOTE — Progress Notes (Signed)
Garret Reddish, MD Phone: [phone]  Subjective:   Diane Little is a 81 y.o. year old very pleasant female patient who presents with the following:  Memory Loss/Dementia Was seen in September by Dr. Maudie Mercury for memory loss and plan was for neurology referral. Mini cog was apparently normal at that time. Apparently there has been at least  1year of worsening memory issues. Patient did not follow through with appointment (may have forgeotten). She has a HS education. Family states they have noticed decline but seems worse in last 2 months. She has been pulled over by police in past few months for getting lost although patient denies. She came in today to get me to fill out paperwork stating it is safe for her to drive. Family  (daughter in law) is concerned for mother's safety while driving .Patient often repeats ideas over and over.   She has a history of low b12 but in 2013 while on supplementation was in normal range and she continues supplement. Thyroid levels normal in 2014. No known history of stroke.   Daughter in law Edmund Hilda [phone] present at visit and will take to neurology visit.   ROS- no headaches/blurry vision  Past Medical History- Patient Active Problem List   Diagnosis Date Noted  . Protein-calorie malnutrition 09/08/2013    Priority: Medium  . Overactive bladder 02/28/2012    Priority: Medium  . Iron deficiency anemia 12/17/2006    Priority: Medium  . Osteoporosis 12/17/2006    Priority: Medium  . Aortic sclerosis 05/27/2014    Priority: Low  . Total knee replacement status 06/10/2007    Priority: Low  . Allergic rhinitis 12/17/2006    Priority: Low  . Memory loss 07/13/2014   Medications- reviewed and updated Current Outpatient Prescriptions  Medication Sig Dispense Refill  . calcium-vitamin D (OSCAL WITH D 500-200) 500-200 MG-UNIT per tablet Take 1 tablet by mouth 2 (two) times daily.      . feeding supplement (BOOST HIGH PROTEIN) LIQD Take 1  Container by mouth 2 (two) times daily between meals.    . ferrous sulfate 325 (65 FE) MG tablet Take 1 tablet (325 mg total) by mouth daily with breakfast. 30 tablet 1  . fesoterodine (TOVIAZ) 8 MG TB24 tablet Take 1 tablet (8 mg total) by mouth daily. 30 tablet 11  . fluticasone (FLONASE) 50 MCG/ACT nasal spray Place 2 sprays into the nose daily as needed. 16 g 5  . loratadine (CLARITIN) 10 MG tablet TAKE 1 TABLET BY MOUTH DAILY 90 tablet 3  . Misc Natural Products (OSTEO BI-FLEX ADV DOUBLE ST PO) Take 1 tablet by mouth daily.     . Multiple Vitamin (MULTIVITAMIN WITH MINERALS) TABS Take 1 tablet by mouth daily.    Marland Kitchen oxybutynin (DITROPAN) 5 MG tablet TAKE 1 TABLET BY MOUTH TWICE DAILY 60 tablet 5  . pyridoxine (B-6) 50 MG tablet Take 50 mg by mouth daily.      . vitamin B-12 (CYANOCOBALAMIN) 250 MCG tablet Take 250 mcg by mouth daily.      Marland Kitchen zinc gluconate 50 MG tablet Take 50 mg by mouth daily.      Marland Kitchen acetaminophen (TYLENOL) 650 MG CR tablet Take 650 mg by mouth every 8 (eight) hours as needed for pain.     . polyethylene glycol (MIRALAX / GLYCOLAX) packet Take 17 g by mouth daily as needed. (Patient not taking: Reported on 05/27/2014) 14 each 0  . [DISCONTINUED] oxybutynin (DITROPAN) 5 MG tablet Take 1 tablet (  5 mg total) by mouth 2 (two) times daily. 60 tablet 11   Objective: BP 140/78 mmHg  Pulse 74  Temp(Src) 98.8 F (37.1 C)  Ht 5\' 3"  (1.6 m)  Wt 100 lb (45.36 kg)  BMI 17.72 kg/m2 Gen: NAD, resting comfortably  MMSE 24/30 07/13/14. -missed 1 for date, location, 3 for 3 word recall, and 1 for copying design   Assessment/Plan:  Memory loss MMSE 24/30 07/13/14. -missed 1 for date, location, 3 for 3 word recall, and 1 for copying design. Plan today was to discuss workup for reversible causes of dementia/memory loss but was difficult to proceed as patient would repeat thoughts 2-3 times in visit and was very focused on maintaining her license which I did not think was the safest at this  point until further evaluation. Possible this is only mild cognitive impairment. Previous plan was for referral to neurology and we ultimately opted to proceed down this path (patient was frustrated with me for not agreeing to let her drive).    Did not complete phq9, initial labwork as planned.   Orders Placed This Encounter  Procedures  . Ambulatory referral to Neurology    Referral Priority:  Routine    Referral Type:  Consultation    Referral Reason:  Specialty Services Required    Requested Specialty:  Neurology    Number of Visits Requested:  1

## 2014-07-13 NOTE — Assessment & Plan Note (Signed)
MMSE 24/30 07/13/14. -missed 1 for date, location, 3 for 3 word recall, and 1 for copying design. Plan today was to discuss workup for reversible causes of dementia/memory loss but was difficult to proceed as patient would repeat thoughts 2-3 times in visit and was very focused on maintaining her license which I did not think was the safest at this point until further evaluation. Possible this is only mild cognitive impairment. Previous plan was for referral to neurology and we ultimately opted to proceed down this path (patient was frustrated with me for not agreeing to let her drive).

## 2014-07-27 ENCOUNTER — Other Ambulatory Visit: Payer: Self-pay

## 2014-07-27 MED ORDER — LORATADINE 10 MG PO TABS: 10.0000 mg | ORAL_TABLET | Freq: Every day | ORAL | Status: DC

## 2014-07-27 NOTE — Telephone Encounter (Signed)
Rx request for Loratadine 10 mg tablets-Take 1 tablet by mouth every day #90  Pharm:  [facility]

## 2014-09-07 ENCOUNTER — Telehealth: Payer: Self-pay

## 2014-09-07 MED ORDER — OXYBUTYNIN CHLORIDE 5 MG PO TABS: ORAL_TABLET | ORAL | Status: DC

## 2014-09-07 NOTE — Telephone Encounter (Signed)
[facility] request for oxybutynin (DITROPAN) 5 MG tablet

## 2014-12-25 ENCOUNTER — Telehealth: Payer: Self-pay | Admitting: Family Medicine

## 2014-12-25 NOTE — Telephone Encounter (Signed)
See below

## 2014-12-25 NOTE — Telephone Encounter (Signed)
FYI   Np Soni call from [facility] to say pt family was concern pt has dementia so she tested the pt and has confirm pt does dementia

## 2014-12-28 NOTE — Telephone Encounter (Signed)
[facility] called the pt form 07/13/14-07/23/14 and left messages for pt but pt never returned phone calls. They will see pt but pt or family member needs to call to schedule appointment.

## 2014-12-28 NOTE — Telephone Encounter (Signed)
What is the status o fher neurology referral from march of this year? May need to repeat this referral

## 2014-12-29 NOTE — Telephone Encounter (Signed)
Great update. Can you call family member if there is DPR and inform them of this?

## 2014-12-30 NOTE — Telephone Encounter (Signed)
Called and left message for pt son Laverna Peace to cb regarding below message

## 2015-03-19 ENCOUNTER — Other Ambulatory Visit: Payer: Self-pay | Admitting: *Deleted

## 2015-03-19 MED ORDER — OXYBUTYNIN CHLORIDE 5 MG PO TABS: ORAL_TABLET | ORAL | Status: DC

## 2015-05-18 ENCOUNTER — Ambulatory Visit: Payer: Self-pay | Admitting: Family Medicine

## 2015-06-30 ENCOUNTER — Other Ambulatory Visit: Payer: Self-pay | Admitting: Family Medicine

## 2015-08-01 ENCOUNTER — Other Ambulatory Visit: Payer: Self-pay | Admitting: Family Medicine

## 2015-09-07 ENCOUNTER — Other Ambulatory Visit (INDEPENDENT_AMBULATORY_CARE_PROVIDER_SITE_OTHER): Payer: Medicare Other

## 2015-09-07 DIAGNOSIS — Z0000 Encounter for general adult medical examination without abnormal findings: Secondary | ICD-10-CM | POA: Diagnosis not present

## 2015-09-07 LAB — HEPATIC FUNCTION PANEL
ALK PHOS: 45 U/L (ref 39–117)
ALT: 17 U/L (ref 0–35)
AST: 18 U/L (ref 0–37)
Albumin: 4 g/dL (ref 3.5–5.2)
BILIRUBIN DIRECT: 0.1 mg/dL (ref 0.0–0.3)
TOTAL PROTEIN: 6.6 g/dL (ref 6.0–8.3)
Total Bilirubin: 0.6 mg/dL (ref 0.2–1.2)

## 2015-09-07 LAB — LIPID PANEL
CHOL/HDL RATIO: 3
Cholesterol: 156 mg/dL (ref 0–200)
HDL: 52.2 mg/dL (ref >39.00)
LDL Cholesterol: 88 mg/dL (ref 0–99)
NONHDL: 103.6
Triglycerides: 79 mg/dL (ref 0.0–149.0)
VLDL: 15.8 mg/dL (ref 0.0–40.0)

## 2015-09-07 LAB — CBC WITH DIFFERENTIAL/PLATELET
BASOS ABS: 0 10*3/uL (ref 0.0–0.1)
Basophils Relative: 0.6 % (ref 0.0–3.0)
EOS ABS: 0.1 10*3/uL (ref 0.0–0.7)
Eosinophils Relative: 2.8 % (ref 0.0–5.0)
HEMATOCRIT: 34 % — AB (ref 36.0–46.0)
HEMOGLOBIN: 11.6 g/dL — AB (ref 12.0–15.0)
LYMPHS PCT: 15.6 % (ref 12.0–46.0)
Lymphs Abs: 0.8 10*3/uL (ref 0.7–4.0)
MCHC: 34 g/dL (ref 30.0–36.0)
MCV: 95.6 fl (ref 78.0–100.0)
MONOS PCT: 10.9 % (ref 3.0–12.0)
Monocytes Absolute: 0.6 10*3/uL (ref 0.1–1.0)
Neutro Abs: 3.6 10*3/uL (ref 1.4–7.7)
Neutrophils Relative %: 70.1 % (ref 43.0–77.0)
Platelets: 169 10*3/uL (ref 150.0–400.0)
RBC: 3.55 Mil/uL — AB (ref 3.87–5.11)
RDW: 13.6 % (ref 11.5–15.5)
WBC: 5.2 10*3/uL (ref 4.0–10.5)

## 2015-09-07 LAB — BASIC METABOLIC PANEL
BUN: 17 mg/dL (ref 6–23)
CALCIUM: 9.5 mg/dL (ref 8.4–10.5)
CO2: 31 mEq/L (ref 19–32)
CREATININE: 0.84 mg/dL (ref 0.40–1.20)
Chloride: 105 mEq/L (ref 96–112)
GFR: 67.92 mL/min (ref >60.00)
Glucose, Bld: 99 mg/dL (ref 70–99)
Potassium: 3.9 mEq/L (ref 3.5–5.1)
SODIUM: 142 meq/L (ref 135–145)

## 2015-09-07 LAB — TSH: TSH: 0.53 u[IU]/mL (ref 0.35–4.50)

## 2015-09-14 ENCOUNTER — Ambulatory Visit (INDEPENDENT_AMBULATORY_CARE_PROVIDER_SITE_OTHER): Payer: Medicare Other | Admitting: Family Medicine

## 2015-09-14 ENCOUNTER — Encounter: Payer: Self-pay | Admitting: Family Medicine

## 2015-09-14 VITALS — BP 130/74 | HR 76 | Temp 99.0°F | Ht 63.0 in | Wt 102.0 lb

## 2015-09-14 DIAGNOSIS — Z0000 Encounter for general adult medical examination without abnormal findings: Secondary | ICD-10-CM

## 2015-09-14 DIAGNOSIS — R413 Other amnesia: Secondary | ICD-10-CM | POA: Diagnosis not present

## 2015-09-14 NOTE — Progress Notes (Signed)
Phone: [phone]  Subjective:  Patient presents today for their annual physical. Chief complaint-noted.   See problem oriented charting- ROS- full  review of systems was completed and negative except for: memory loss  The following were reviewed and entered/updated in epic: Past Medical History  Diagnosis Date  . Allergy   . Anemia   . Hypertension   . Osteoporosis   . Nail fungus   . Rash   . Diverticulosis   . Internal hemorrhoids   . Osteoporosis with fracture   . HYPERTENSION 12/17/2006    no rx, former  . Hip fracture, left ([place]) 06/26/2012  . TOTAL KNEE REPLACEMENT, RIGHT, HX OF 06/10/2007    r   Patient Active Problem List   Diagnosis Date Noted  . Protein-calorie malnutrition ([place]) 09/08/2013    Priority: Medium  . Overactive bladder 02/28/2012    Priority: Medium  . Iron deficiency anemia 12/17/2006    Priority: Medium  . Osteoporosis 12/17/2006    Priority: Medium  . Aortic sclerosis ([place]) 05/27/2014    Priority: Low  . Total knee replacement status 06/10/2007    Priority: Low  . Allergic rhinitis 12/17/2006    Priority: Low  . Memory loss 07/13/2014   Past Surgical History  Procedure Laterality Date  . Replacement total knee Right   . Tonsillectomy    . Cataract extraction Bilateral   . Hip arthroplasty Left 06/28/2012    Procedure: Left HIP HEMI ARTHROPLASTY;  Surgeon: Mauri Pole, MD;  Location: [facility];  Service: Orthopedics;  Laterality: Left;    Family History  Problem Relation Age of Onset  . Heart disease Mother     Medications- reviewed and updated Current Outpatient Prescriptions  Medication Sig Dispense Refill  . calcium-vitamin D (OSCAL WITH D 500-200) 500-200 MG-UNIT per tablet Take 1 tablet by mouth 2 (two) times daily.      . feeding supplement (BOOST HIGH PROTEIN) LIQD Take 1 Container by mouth 2 (two) times daily between meals.    . ferrous sulfate 325 (65 FE) MG tablet Take 1 tablet (325 mg total) by mouth daily with  breakfast. 30 tablet 1  . fluticasone (FLONASE) 50 MCG/ACT nasal spray Place 2 sprays into the nose daily as needed. 16 g 5  . loratadine (CLARITIN) 10 MG tablet TAKE 1 TABLET(10 MG) BY MOUTH DAILY 90 tablet 3  . Multiple Vitamin (MULTIVITAMIN WITH MINERALS) TABS Take 1 tablet by mouth daily.    Marland Kitchen oxybutynin (DITROPAN) 5 MG tablet TAKE 1 TABLET BY MOUTH TWICE DAILY 60 tablet 3  . pyridoxine (B-6) 50 MG tablet Take 50 mg by mouth daily.      . vitamin B-12 (CYANOCOBALAMIN) 250 MCG tablet Take 250 mcg by mouth daily.      Marland Kitchen zinc gluconate 50 MG tablet Take 50 mg by mouth daily.      Marland Kitchen acetaminophen (TYLENOL) 650 MG CR tablet Take 650 mg by mouth every 8 (eight) hours as needed for pain. Reported on 09/14/2015    . polyethylene glycol (MIRALAX / GLYCOLAX) packet Take 17 g by mouth daily as needed. (Patient not taking: Reported on 05/27/2014) 14 each 0   No current facility-administered medications for this visit.    Allergies-reviewed and updated Allergies  Allergen Reactions  . Fish Allergy Rash    ALL SEAFOOD  . Pneumococcal Vaccine Polyvalent   . Ramipril     REACTION: rash    Social History   Social History  . Marital Status: Single  Spouse Name: N/A  . Number of Children: 2  . Years of Education: N/A   Occupational History  . retired    Social History Main Topics  . Smoking status: Never Smoker   . Smokeless tobacco: Never Used  . Alcohol Use: No  . Drug Use: No  . Sexual Activity: Not Currently   Other Topics Concern  . None   Social History Narrative   Lives alone. Son lives next door.    Widowed. 2 sons (both in are), Step grandchildren      Retired from [employer]       Hobbies: some bus trips with friends, time with friends    Objective: BP 130/74 mmHg  Pulse 76  Temp(Src) 99 F (37.2 C)  Ht 5\' 3"  (1.6 m)  Wt 102 lb (46.267 kg)  BMI 18.07 kg/m2 Gen: NAD, resting comfortably, thin appearing HEENT: Mucous membranes are moist. Oropharynx  normal but poor dentition CV: RRR 2/6 SEM no rubs or gallops- known aortic sclerosis Lungs: CTAB no crackles, wheeze, rhonchi Abdomen: soft/nontender/nondistended/normal bowel sounds. No rebound or guarding.  Ext: no edema Skin: warm, dry, no rash Neuro: grossly normal, moves all extremities, PERRLA  Assessment/Plan:  82 y.o. female presenting for annual physical.  Health Maintenance counseling: 1. Anticipatory guidance: Patient counseled regarding regular dental exam, eye exams, wearing seatbelts.  2. Risk factor reduction:  Advised patient of need for regular exercise and diet rich and fruits and vegetables to reduce risk of heart attack and stroke. Still doing some walking  Mainly in yard.  3. Immunizations/screenings/ancillary studies Immunization History  Administered Date(s) Administered  . Influenza Split 02/01/2011, 02/28/2012  . Influenza Whole 03/08/2007, 02/07/2008, 02/02/2009, 01/27/2010  . Influenza, High Dose Seasonal PF 02/24/2013  . Influenza,inj,Quad PF,36+ Mos 01/09/2014  . Pneumococcal Conjugate-13 09/08/2013  . Pneumococcal Polysaccharide-23 05/08/2001, 08/19/2008  . Td 05/09/2003, 09/08/2013  up to date- never had chicken pox   4. Cervical cancer screening- aged out 88. Breast cancer screening-  breast exam aged out and mammogram aged out 63. Colon cancer screening - aged out, had last colonoscopy 2014- no further rectal bleeding does not want repeat  Status of chronic or acute concerns   1. Has declined fosamax though has osteoporosis- has taken in past and declines repeat. Advised calcium/vitamin D- she will continue  2. Takes oxybutynin 5mg  BID- stable for overactive bladder wants to continue, also took [place] 8mg  daily in past- no longer taking. Less anticholinergic = better  3. Memory loss - refer to neurology again- referred last year but they were unable to contact patient. MMSE 24/30 at that time. Family thinks some worse.   Return precautions advised.    Orders Placed This Encounter  Procedures  . Ambulatory referral to Neurology    Referral Priority:  Routine    Referral Type:  Consultation    Referral Reason:  Specialty Services Required    Requested Specialty:  Neurology    Number of Visits Requested:  1   Garret Reddish, MD

## 2015-09-14 NOTE — Patient Instructions (Addendum)
Overall I think you are doing well  Labs look great except for mild anemia. You declined repeat colonoscopy but agree to continue iron.   We will call you within a week about your referral to neurology about memory loss. If you do not hear within 2 weeks, give [place] a call.   I would also like for you to sign up for an annual wellness visit on a Friday with our nurse Manuela Schwartz. This is a free benefit under medicare that may help [place] find additional ways to help you. I would do this in the next few months.   See me at least yearly but I am happy to see you every 6 months or more frequently if you would like

## 2015-10-21 ENCOUNTER — Ambulatory Visit: Payer: Medicare Other | Admitting: Neurology

## 2015-10-25 ENCOUNTER — Ambulatory Visit: Payer: Medicare Other | Admitting: Neurology

## 2015-10-29 ENCOUNTER — Encounter: Payer: Self-pay | Admitting: Neurology

## 2015-11-26 ENCOUNTER — Ambulatory Visit (INDEPENDENT_AMBULATORY_CARE_PROVIDER_SITE_OTHER): Payer: Medicare Other

## 2015-11-26 ENCOUNTER — Telehealth: Payer: Self-pay

## 2015-11-26 ENCOUNTER — Ambulatory Visit: Payer: Medicare Other

## 2015-11-26 VITALS — BP 150/50 | HR 92 | Ht 66.0 in | Wt 103.0 lb

## 2015-11-26 DIAGNOSIS — Z0000 Encounter for general adult medical examination without abnormal findings: Secondary | ICD-10-CM | POA: Diagnosis not present

## 2015-11-26 NOTE — Telephone Encounter (Signed)
Dr. Yong Channel. This patient in [facility]: please see note; concern for continued weight loss and decline in self care and mobility  in the last 2 weeks as reported by the sons'. Son's state she has not bathed x 30 days but was clean today; clothes were very baggy; son reports not eating well. Sons have secured w/c yesterday; states she really can't stand well for weight without them holding on; appetite poor; voiding some incontinently; did not know oxybutynin was for overactive bladder; will start but stop the med if she has further confusion; in depends this visit.  Educated as to smoothies and finger foods; MMSE 20/30. Missed neuro apt; son staying approx 10 to 12 hours during the day, the other son putting her to bed at hs and checking on her in the am  Requesting [place] to evaluate skin; nutritional status; the full extent of inability to walk; ? PT to evaluate due to changes in the last 2 weeks impacting her independence. An apt was to be made with you; but earliest available was August   I will fup with the son;  Please advise on [place] to evaluate for changed in ability to ambulate

## 2015-11-26 NOTE — Progress Notes (Addendum)
Subjective:   Diane Little is a 82 y.o. female who presents for Medicare Annual (Subsequent) preventive examination.  HRA assessment completed during this visit with Ms. Sein /88  The Patient was informed that the wellness visit is to identify future health risk and educate and initiate measures that can reduce risk for increased disease through the lifespan.    NO ROS; Medicare Wellness Visit Last OV:  09/2015 Labs completed: 09/2015/ HDL 52; Trig 79/ glucose 99   Lifestyle review and risk: (mother had HD)  HTN; Lipid ratio 3 / Glucose 99 Osteoporosis; taking calcium and vit d  Hip fx 2014 x 82 yo Fall risk due to overactive bladder   Typical day;  The patient presented today for AWV with 3 sons The sons provided most of the information Hx of increasing memory issues and the sons states her walking has slowed as of the last 2 weeks; not sure why but appears frail; thin; although skin has fair turgor and is warm and dry. Color good;  ADLs mostly managed by sons.  Goes to the bathroom with walker at home Home alone at hs; has recently become incontinent; has depends on today Accidents sometimes going to the bathroom;  States she will "drop"  down on the commode x 12" if no one is there to help her;  States skin around buttocks is red; denies skin has broke down; but son states mother has not been bathed in 30 days; They can't bath her;   Laverna Peace lives nearby; checks on in am and around 10 pm; helps her to bed; Laverna Peace works and has to go back to work today   Doctor, hospital: gets to patient's  home 8:30 and 10am; stays until 7:30 or 8pm; This is daily; getting very tired;   Discussed Long term plan at length; They have filled for medicaid application Educated on Fl2 process; risk for SNF placement due to failure to thrive; urinary incontinence; Richardson Landry states she will sometimes refuse him; gets agitated;   Given resource for [employer] with 24/7 helpline;   Family reports changes x  one month ago; was independent completely prior to 30 days ago  Stopped eating as much;  Richardson Landry cooks breakfast; eggs; tomato;  Eats about  what he cooks  Lunch; chicken on stick;  Supper; nothing at times. Educated to prepare shakes with protein; have finger foods around so she can eat; cheese crackers; peanut butter crackers; add honey to shakes; uses yogurt or ice cream;   Missing teeth so she cannot bite down  Weight; 103 the last office weight; at risk; clothes baggy; son feels she has lost weight as she is "just not eating well"  States she "might" can stand if they "hold" her. Did not bring walker; Decided not to risk weight today but would estimate her at 53 - 100lbs  Educated on memory issues; slow approach; redirecting; always talking in slow and low tone; holding hand over hand to help to the bathroom; Offer food throughout the day;   Tobacco: negative, but had second hand smoke   How many drinks do you have per week?  0  Medications: The son Richardson Landry) states he is not giving her the Oxybutynin as he was not sure what this was for. Requested he start as this may help her ua incontinence; but can call confusion and if she gets more confused to stop   BMI: 18 last visit with weight at 103  16.6 this visit; and may be  lower.   Exercise;  sleeps most of the day   HOME SAFETY; does have stairs to get in home; taking out in w/c  Was walking but stopped walking x 2 weeks ago;  No firearms; son states she did try to boil water on stove; recommended they disconnect the stove for now. Remove items she could incidentally use to hurt herself.  Fall risk: high risk; especially at hs by herself;   Given education on "Fall Prevention in the Home" for more safety tips the patient can apply as appropriate.  Long term goal is to secure placement   Mental Health:  Any emotional problems? Anxious, depressed, irritable, sad or blue?   Denies feeling depressed or hopeless; voices pleasure in  daily life Mood and affect are stable today; she smiles; speaks and ask questions. Appears to be HOH;   Pain: no; states buttocks hurts where she feel Hip has been hurting; no open areas per the son; "as far as they know"   Cognitive; the son states they did not make the apt to neuro; Orientation 0/5; year 42?; it the fall going into winter; (had jacket on today) day of the week Monday/ not Friday;  Oriented to place; city, county etc Recall 0/3 but remembered with cuing Responded appropriately to questions; discussed need to possibly look at assisted living and the patient stated, "she may need more help than her sons' can do". 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   Advanced Directive addressed; not completed  Given [place] form and reviewed the form in it's entirety. The patient was asked about life support. She stated if I am sick, "I want them to help me"  Asked if she had a terminal disease and was not curable, would she want life support and she stated no. Can state her full name and address without issue.  Recommended they take mother to complete HCPOA with 2 witnesses unrelated and where they have a notary.   Counseling Health Maintenance Gaps:  Colonoscopy; 09/2012; aged out unless medically necessary EKG: 06/2012 Mammogram: 01/2013/ aged out Dexa/ Has declined fosamax though has osteoporosis- has taken in past and declines repeat. Advised calcium/vitamin D- she will continue PAP: 05/2001; aged out   Hearing: not accessible / does have some memory loss    Ophthalmology exam/ not recent / deferred at present   Immunizations Due: (Vaccines reviewed and educated regarding any overdue)   Individual Goal: To push food / smoothies or increase food intake. The patient stated she does drink supplement; ensure  Health Recommendations and Referrals Continue to work with DSS for LT care planning Given  the name of [facility] for AL and SNf in the area. Recommended the sons call [employer] (insurer) and speak with Case Manager; as she is high risk for hospitalization and at times; they will waive the 3 day hospitalization prior to snf placement; ( can self refer to [facility] case management dept)    Barriers to Success  Current Care Team reviewed and updated    Cardiac Risk Factors include: advanced age (>4men, >68 women);sedentary lifestyle     Objective:     Vitals: BP 150/50 mmHg  Pulse 92  Ht 5\' 6"  (1.676 m)  Wt 103 lb (46.72 kg)  BMI 16.63 kg/m2  SpO2 94%  Body mass index is 16.63 kg/(m^2).   Tobacco History  Smoking status  . Never  Smoker   Smokeless tobacco  . Never Used     Counseling given: Yes   Past Medical History  Diagnosis Date  . Allergy   . Anemia   . Hypertension   . Osteoporosis   . Nail fungus   . Rash   . Diverticulosis   . Internal hemorrhoids   . Osteoporosis with fracture   . HYPERTENSION 12/17/2006    no rx, former  . Hip fracture, left ([place]) 06/26/2012  . TOTAL KNEE REPLACEMENT, RIGHT, HX OF 06/10/2007    r   Past Surgical History  Procedure Laterality Date  . Replacement total knee Right   . Tonsillectomy    . Cataract extraction Bilateral   . Hip arthroplasty Left 06/28/2012    Procedure: Left HIP HEMI ARTHROPLASTY;  Surgeon: Mauri Pole, MD;  Location: [facility];  Service: Orthopedics;  Laterality: Left;   Family History  Problem Relation Age of Onset  . Heart disease Mother    History  Sexual Activity  . Sexual Activity: Not Currently    Outpatient Encounter Prescriptions as of 11/26/2015  Medication Sig  . acetaminophen (TYLENOL) 650 MG CR tablet Take 650 mg by mouth every 8 (eight) hours as needed for pain. Reported on 09/14/2015  . calcium-vitamin D (OSCAL WITH D 500-200) 500-200 MG-UNIT per tablet Take 1 tablet by mouth 2 (two) times daily.    . feeding supplement (BOOST HIGH PROTEIN) LIQD Take 1 Container by  mouth 2 (two) times daily between meals.  . ferrous sulfate 325 (65 FE) MG tablet Take 1 tablet (325 mg total) by mouth daily with breakfast.  . Multiple Vitamin (MULTIVITAMIN WITH MINERALS) TABS Take 1 tablet by mouth daily.  . polyethylene glycol (MIRALAX / GLYCOLAX) packet Take 17 g by mouth daily as needed.  . pyridoxine (B-6) 50 MG tablet Take 50 mg by mouth daily.    . vitamin B-12 (CYANOCOBALAMIN) 250 MCG tablet Take 250 mcg by mouth daily.    Marland Kitchen zinc gluconate 50 MG tablet Take 50 mg by mouth daily.    . fluticasone (FLONASE) 50 MCG/ACT nasal spray Place 2 sprays into the nose daily as needed. (Patient not taking: Reported on 11/26/2015)  . loratadine (CLARITIN) 10 MG tablet TAKE 1 TABLET(10 MG) BY MOUTH DAILY (Patient not taking: Reported on 11/26/2015)  . oxybutynin (DITROPAN) 5 MG tablet TAKE 1 TABLET BY MOUTH TWICE DAILY (Patient not taking: Reported on 11/26/2015)   No facility-administered encounter medications on file as of 11/26/2015.    Activities of Daily Living In your present state of health, do you have any difficulty performing the following activities: 11/26/2015  Hearing? Y  Vision? Y  Difficulty concentrating or making decisions? Y  Walking or climbing stairs? Y  Dressing or bathing? (No Data)  Doing errands, shopping? Y  Preparing Food and eating ? Y  Using the Toilet? Y  In the past six months, have you accidently leaked urine? Y  Do you have problems with loss of bowel control? N  Managing your Medications? Y  Managing your Finances? Y  Housekeeping or managing your Housekeeping? Y    Patient Care Team: Marin Olp, MD as PCP - General (Family Medicine)    Assessment:     Exercise Activities and Dietary recommendations    Goals    . patient     Family in process of long term care planning      Fall Risk Fall Risk  11/26/2015 09/14/2015 09/08/2013 02/24/2013 07/29/2012  Falls  in the past year? Yes No No Yes Yes  Number falls in past yr: - - - 2 or  more 2 or more  Injury with Fall? No - - - -  Risk Factor Category  - - - High Fall Risk -  Risk for fall due to : - - - Impaired balance/gait;Impaired mobility Impaired balance/gait;History of fall(s);Impaired mobility;Other (Comment)  Risk for fall due to (comments): - - - - hip replacement   Depression Screen PHQ 2/9 Scores 11/26/2015 09/14/2015 09/08/2013 02/24/2013  PHQ - 2 Score 0 0 0 1    No overt signs; son denies crying; etc   Cognitive Testing MMSE - Mini Mental State Exam 11/26/2015  Orientation to time 0  Orientation to Place 5  Registration 3  Attention/ Calculation 5  Recall 0  Language- name 2 objects 2  Language- repeat 1  Language- follow 3 step command 2  Language- read & follow direction 1  Write a sentence 1  Copy design 0  Total score 20   Difficult to determine ability to stay independently; Still aware of how to get help; sons assisting; moving to area where she can get more help Unfortunately, did not go to the Neuro apt because the son could not get her up and dressed.  Immunization History  Administered Date(s) Administered  . Influenza Split 02/01/2011, 02/28/2012  . Influenza Whole 03/08/2007, 02/07/2008, 02/02/2009, 01/27/2010  . Influenza, High Dose Seasonal PF 02/24/2013  . Influenza,inj,Quad PF,36+ Mos 01/09/2014  . Pneumococcal Conjugate-13 09/08/2013  . Pneumococcal Polysaccharide-23 05/08/2001, 08/19/2008  . Td 05/09/2003, 09/08/2013   Screening Tests Health Maintenance  Topic Date Due  . ZOSTAVAX  06/10/2019 (Originally 02/[birth year]/1987)  . INFLUENZA VACCINE  12/07/2015  . TETANUS/TDAP  09/09/2023  . DEXA SCAN  Addressed  . PNA vac Low Risk Adult  Completed      Plan:   Reviewed resources  Will schedule with Dr. Yong Channel fup with possible [facility]; to evaluate skin; falls etc.   [employer]; [phone] Senior Directory; Information regarding Long Term Care Please start checking the long term care procedures;   Continue  to work with DSS on solutions;   Ask facilities for [facility];; this is the first step;  The doctor will sign; go on a waiting list.   Call [facility]; tell them that mother is at risk for placement;  See if she can get a case Freight forwarder;   Also recommended consideration of Adult Day Care for respite in the interim  During the course of the visit the patient was educated and counseled about the following appropriate screening and preventive services:   Vaccines to include Pneumoccal, Influenza, Hepatitis B, Td, Zostavax, HCV  Electrocardiogram  Cardiovascular Disease  Colorectal cancer screening  Bone density screening  Diabetes screening  Glaucoma screening  Mammography/PAP  Nutrition counseling   Patient Instructions (the written plan) was given to the patient.   [id number], RN  11/26/2015     I have reviewed and agree with note, evaluation, plan. We will attempt to facilitate follow up for changes in her medical status.   Garret Reddish, MD

## 2015-11-26 NOTE — Patient Instructions (Addendum)
Diane Little , Thank you for taking time to come for your Medicare Wellness Visit. I appreciate your ongoing commitment to your health goals. Please review the following plan we discussed and let me know if I can assist you in the future.   Reviewed resources  Will schedule with Dr. Durene Cal fup with possible HHC; to evaluate skin; falls etc.   [facility]; [phone] Senior Directory; Information regarding Long Term Care Please start checking the long term care procedures;   Continue to work with DSS on solutions;   Ask facilities for [facility]; this is the first step;  The doctor will sign; go on a waiting list.   Call [facility]; tell them that mother is at risk for placement;  See if she can get a case Production designer, theatre/television/film;    These are the goals we discussed: Goals    . patient     Family in process of long term care planning       This is a list of the screening recommended for you and due dates:  Health Maintenance  Topic Date Due  . Shingles Vaccine  06/10/2019*  . Flu Shot  12/07/2015  . Tetanus Vaccine  09/09/2023  . DEXA scan (bone density measurement)  Addressed  . Pneumonia vaccines  Completed  *Topic was postponed. The date shown is not the original due date.     Fall Prevention in the Home  Falls can cause injuries. They can happen to people of all ages. There are many things you can do to make your home safe and to help prevent falls.  WHAT CAN I DO ON THE OUTSIDE OF MY HOME?  Regularly fix the edges of walkways and driveways and fix any cracks.  Remove anything that might make you trip as you walk through a door, such as a raised step or threshold.  Trim any bushes or trees on the path to your home.  Use bright outdoor lighting.  Clear any walking paths of anything that might make someone trip, such as rocks or tools.  Regularly check to see if handrails are loose or broken. Make sure that both sides of any steps have handrails.  Any raised decks and  porches should have guardrails on the edges.  Have any leaves, snow, or ice cleared regularly.  Use sand or salt on walking paths during winter.  Clean up any spills in your garage right away. This includes oil or grease spills. WHAT CAN I DO IN THE BATHROOM?   Use night lights.  Install grab bars by the toilet and in the tub and shower. Do not use towel bars as grab bars.  Use non-skid mats or decals in the tub or shower.  If you need to sit down in the shower, use a plastic, non-slip stool.  Keep the floor dry. Clean up any water that spills on the floor as soon as it happens.  Remove soap buildup in the tub or shower regularly.  Attach bath mats securely with double-sided non-slip rug tape.  Do not have throw rugs and other things on the floor that can make you trip. WHAT CAN I DO IN THE BEDROOM?  Use night lights.  Make sure that you have a light by your bed that is easy to reach.  Do not use any sheets or blankets that are too big for your bed. They should not hang down onto the floor.  Have a firm chair that has side arms. You can use this  for support while you get dressed.  Do not have throw rugs and other things on the floor that can make you trip. WHAT CAN I DO IN THE KITCHEN?  Clean up any spills right away.  Avoid walking on wet floors.  Keep items that you use a lot in easy-to-reach places.  If you need to reach something above you, use a strong step stool that has a grab bar.  Keep electrical cords out of the way.  Do not use floor polish or wax that makes floors slippery. If you must use wax, use non-skid floor wax.  Do not have throw rugs and other things on the floor that can make you trip. WHAT CAN I DO WITH MY STAIRS?  Do not leave any items on the stairs.  Make sure that there are handrails on both sides of the stairs and use them. Fix handrails that are broken or loose. Make sure that handrails are as long as the stairways.  Check any  carpeting to make sure that it is firmly attached to the stairs. Fix any carpet that is loose or worn.  Avoid having throw rugs at the top or bottom of the stairs. If you do have throw rugs, attach them to the floor with carpet tape.  Make sure that you have a light switch at the top of the stairs and the bottom of the stairs. If you do not have them, ask someone to add them for you. WHAT ELSE CAN I DO TO HELP PREVENT FALLS?  Wear shoes that:  Do not have high heels.  Have rubber bottoms.  Are comfortable and fit you well.  Are closed at the toe. Do not wear sandals.  If you use a stepladder:  Make sure that it is fully opened. Do not climb a closed stepladder.  Make sure that both sides of the stepladder are locked into place.  Ask someone to hold it for you, if possible.  Clearly mark and make sure that you can see:  Any grab bars or handrails.  First and last steps.  Where the edge of each step is.  Use tools that help you move around (mobility aids) if they are needed. These include:  Canes.  Walkers.  Scooters.  Crutches.  Turn on the lights when you go into a dark area. Replace any light bulbs as soon as they burn out.  Set up your furniture so you have a clear path. Avoid moving your furniture around.  If any of your floors are uneven, fix them.  If there are any pets around you, be aware of where they are.  Review your medicines with your doctor. Some medicines can make you feel dizzy. This can increase your chance of falling. Ask your doctor what other things that you can do to help prevent falls.   This information is not intended to replace advice given to you by your health care provider. Make sure you discuss any questions you have with your health care provider.   Document Released: 02/18/2009 Document Revised: 09/08/2014 Document Reviewed: 05/29/2014 Elsevier Interactive Patient Education 2016 [place] Maintenance,  Female Adopting a healthy lifestyle and getting preventive care can go a long way to promote health and wellness. Talk with your health care provider about what schedule of regular examinations is right for you. This is a good chance for you to check in with your provider about disease prevention and staying healthy. In between checkups, there are plenty of  things you can do on your own. Experts have done a lot of research about which lifestyle changes and preventive measures are most likely to keep you healthy. Ask your health care provider for more information. WEIGHT AND DIET  Eat a healthy diet  Be sure to include plenty of vegetables, fruits, low-fat dairy products, and lean protein.  Do not eat a lot of foods high in solid fats, added sugars, or salt.  Get regular exercise. This is one of the most important things you can do for your health.  Most adults should exercise for at least 150 minutes each week. The exercise should increase your heart rate and make you sweat (moderate-intensity exercise).  Most adults should also do strengthening exercises at least twice a week. This is in addition to the moderate-intensity exercise.  Maintain a healthy weight  Body mass index (BMI) is a measurement that can be used to identify possible weight problems. It estimates body fat based on height and weight. Your health care provider can help determine your BMI and help you achieve or maintain a healthy weight.  For females 75 years of age and older:   A BMI below 18.5 is considered underweight.  A BMI of 18.5 to 24.9 is normal.  A BMI of 25 to 29.9 is considered overweight.  A BMI of 30 and above is considered obese.  Watch levels of cholesterol and blood lipids  You should start having your blood tested for lipids and cholesterol at 80 years of age, then have this test every 5 years.  You may need to have your cholesterol levels checked more often if:  Your lipid or cholesterol levels  are high.  You are older than 80 years of age.  You are at high risk for heart disease.  CANCER SCREENING   Lung Cancer  Lung cancer screening is recommended for adults 42-57 years old who are at high risk for lung cancer because of a history of smoking.  A yearly low-dose CT scan of the lungs is recommended for people who:  Currently smoke.  Have quit within the past 15 years.  Have at least a 30-pack-year history of smoking. A pack year is smoking an average of one pack of cigarettes a day for 1 year.  Yearly screening should continue until it has been 15 years since you quit.  Yearly screening should stop if you develop a health problem that would prevent you from having lung cancer treatment.  Breast Cancer  Practice breast self-awareness. This means understanding how your breasts normally appear and feel.  It also means doing regular breast self-exams. Let your health care provider know about any changes, no matter how small.  If you are in your 20s or 30s, you should have a clinical breast exam (CBE) by a health care provider every 1-3 years as part of a regular health exam.  If you are 18 or older, have a CBE every year. Also consider having a breast X-ray (mammogram) every year.  If you have a family history of breast cancer, talk to your health care provider about genetic screening.  If you are at high risk for breast cancer, talk to your health care provider about having an MRI and a mammogram every year.  Breast cancer gene (BRCA) assessment is recommended for women who have family members with BRCA-related cancers. BRCA-related cancers include:  Breast.  Ovarian.  Tubal.  Peritoneal cancers.  Results of the assessment will determine the need for genetic counseling  and BRCA1 and BRCA2 testing. Cervical Cancer Your health care provider may recommend that you be screened regularly for cancer of the pelvic organs (ovaries, uterus, and vagina). This screening  involves a pelvic examination, including checking for microscopic changes to the surface of your cervix (Pap test). You may be encouraged to have this screening done every 3 years, beginning at age 51.  For women ages 83-65, health care providers may recommend pelvic exams and Pap testing every 3 years, or they may recommend the Pap and pelvic exam, combined with testing for human papilloma virus (HPV), every 5 years. Some types of HPV increase your risk of cervical cancer. Testing for HPV may also be done on women of any age with unclear Pap test results.  Other health care providers may not recommend any screening for nonpregnant women who are considered low risk for pelvic cancer and who do not have symptoms. Ask your health care provider if a screening pelvic exam is right for you.  If you have had past treatment for cervical cancer or a condition that could lead to cancer, you need Pap tests and screening for cancer for at least 20 years after your treatment. If Pap tests have been discontinued, your risk factors (such as having a new sexual partner) need to be reassessed to determine if screening should resume. Some women have medical problems that increase the chance of getting cervical cancer. In these cases, your health care provider may recommend more frequent screening and Pap tests. Colorectal Cancer  This type of cancer can be detected and often prevented.  Routine colorectal cancer screening usually begins at 80 years of age and continues through 80 years of age.  Your health care provider may recommend screening at an earlier age if you have risk factors for colon cancer.  Your health care provider may also recommend using home test kits to check for hidden blood in the stool.  A small camera at the end of a tube can be used to examine your colon directly (sigmoidoscopy or colonoscopy). This is done to check for the earliest forms of colorectal cancer.  Routine screening usually  begins at age 82.  Direct examination of the colon should be repeated every 5-10 years through 80 years of age. However, you may need to be screened more often if early forms of precancerous polyps or small growths are found. Skin Cancer  Check your skin from head to toe regularly.  Tell your health care provider about any new moles or changes in moles, especially if there is a change in a mole's shape or color.  Also tell your health care provider if you have a mole that is larger than the size of a pencil eraser.  Always use sunscreen. Apply sunscreen liberally and repeatedly throughout the day.  Protect yourself by wearing long sleeves, pants, a wide-brimmed hat, and sunglasses whenever you are outside. HEART DISEASE, DIABETES, AND HIGH BLOOD PRESSURE   High blood pressure causes heart disease and increases the risk of stroke. High blood pressure is more likely to develop in:  People who have blood pressure in the high end of the normal range (130-139/85-89 mm Hg).  People who are overweight or obese.  People who are African American.  If you are 37-37 years of age, have your blood pressure checked every 3-5 years. If you are 26 years of age or older, have your blood pressure checked every year. You should have your blood pressure measured twice--once when you are  at a hospital or clinic, and once when you are not at a hospital or clinic. Record the average of the two measurements. To check your blood pressure when you are not at a hospital or clinic, you can use:  An automated blood pressure machine at a pharmacy.  A home blood pressure monitor.  If you are between 76 years and 40 years old, ask your health care provider if you should take aspirin to prevent strokes.  Have regular diabetes screenings. This involves taking a blood sample to check your fasting blood sugar level.  If you are at a normal weight and have a low risk for diabetes, have this test once every three years  after 80 years of age.  If you are overweight and have a high risk for diabetes, consider being tested at a younger age or more often. PREVENTING INFECTION  Hepatitis B  If you have a higher risk for hepatitis B, you should be screened for this virus. You are considered at high risk for hepatitis B if:  You were born in a country where hepatitis B is common. Ask your health care provider which countries are considered high risk.  Your parents were born in a high-risk country, and you have not been immunized against hepatitis B (hepatitis B vaccine).  You have HIV or AIDS.  You use needles to inject street drugs.  You live with someone who has hepatitis B.  You have had sex with someone who has hepatitis B.  You get hemodialysis treatment.  You take certain medicines for conditions, including cancer, organ transplantation, and autoimmune conditions. Hepatitis C  Blood testing is recommended for:  Everyone born from [birth year] through 1963.  Anyone with known risk factors for hepatitis C. Sexually transmitted infections (STIs)  You should be screened for sexually transmitted infections (STIs) including gonorrhea and chlamydia if:  You are sexually active and are younger than 80 years of age.  You are older than 80 years of age and your health care provider tells you that you are at risk for this type of infection.  Your sexual activity has changed since you were last screened and you are at an increased risk for chlamydia or gonorrhea. Ask your health care provider if you are at risk.  If you do not have HIV, but are at risk, it may be recommended that you take a prescription medicine daily to prevent HIV infection. This is called pre-exposure prophylaxis (PrEP). You are considered at risk if:  You are sexually active and do not regularly use condoms or know the HIV status of your partner(s).  You take drugs by injection.  You are sexually active with a partner who has  HIV. Talk with your health care provider about whether you are at high risk of being infected with HIV. If you choose to begin PrEP, you should first be tested for HIV. You should then be tested every 3 months for as long as you are taking PrEP.  PREGNANCY   If you are premenopausal and you may become pregnant, ask your health care provider about preconception counseling.  If you may become pregnant, take 400 to 800 micrograms (mcg) of folic acid every day.  If you want to prevent pregnancy, talk to your health care provider about birth control (contraception). OSTEOPOROSIS AND MENOPAUSE   Osteoporosis is a disease in which the bones lose minerals and strength with aging. This can result in serious bone fractures. Your risk for osteoporosis can be identified  using a bone density scan.  If you are 75 years of age or older, or if you are at risk for osteoporosis and fractures, ask your health care provider if you should be screened.  Ask your health care provider whether you should take a calcium or vitamin D supplement to lower your risk for osteoporosis.  Menopause may have certain physical symptoms and risks.  Hormone replacement therapy may reduce some of these symptoms and risks. Talk to your health care provider about whether hormone replacement therapy is right for you.  HOME CARE INSTRUCTIONS   Schedule regular health, dental, and eye exams.  Stay current with your immunizations.   Do not use any tobacco products including cigarettes, chewing tobacco, or electronic cigarettes.  If you are pregnant, do not drink alcohol.  If you are breastfeeding, limit how much and how often you drink alcohol.  Limit alcohol intake to no more than 1 drink per day for nonpregnant women. One drink equals 12 ounces of beer, 5 ounces of wine, or 1 ounces of hard liquor.  Do not use street drugs.  Do not share needles.  Ask your health care provider for help if you need support or  information about quitting drugs.  Tell your health care provider if you often feel depressed.  Tell your health care provider if you have ever been abused or do not feel safe at home.   This information is not intended to replace advice given to you by your health care provider. Make sure you discuss any questions you have with your health care provider.   Document Released: 11/07/2010 Document Revised: 05/15/2014 Document Reviewed: 03/26/2013 Elsevier Interactive Patient Education 2016 [employer].  Hearing Loss Hearing loss is a partial or total loss of the ability to hear. This can be temporary or permanent, and it can happen in one or both ears. Hearing loss may be referred to as deafness. Medical care is necessary to treat hearing loss properly and to prevent the condition from getting worse. Your hearing may partially or completely come back, depending on what caused your hearing loss and how severe it is. In some cases, hearing loss is permanent. CAUSES Common causes of hearing loss include:   Too much wax in the ear canal.   Infection of the ear canal or middle ear.   Fluid in the middle ear.   Injury to the ear or surrounding area.   An object stuck in the ear.   Prolonged exposure to loud sounds, such as music.  Less common causes of hearing loss include:   Tumors in the ear.   Viral or bacterial infections, such as meningitis.   A hole in the eardrum (perforated eardrum).  Problems with the hearing nerve that sends signals between the brain and the ear.  Certain medicines.  SYMPTOMS  Symptoms of this condition may include:  Difficulty telling the difference between sounds.  Difficulty following a conversation when there is background noise.  Lack of response to sounds in your environment. This may be most noticeable when you do not respond to startling sounds.  Needing to turn up the volume on the television, radio, etc.  Ringing in the  ears.  Dizziness.  Pain in the ears. DIAGNOSIS This condition is diagnosed based on a physical exam and a hearing test (audiometry). The audiometry test will be performed by a hearing specialist (audiologist). You may also be referred to an ear, nose, and throat (ENT) specialist (otolaryngologist).  TREATMENT Treatment for  recent onset of hearing loss may include:   Ear wax removal.   Being prescribed medicines to prevent infection (antibiotics).   Being prescribed medicines to reduce inflammation (corticosteroids).  HOME CARE INSTRUCTIONS  If you were prescribed an antibiotic medicine, take it as told by your health care provider. Do not stop taking the antibiotic even if you start to feel better.  Take over-the-counter and prescription medicines only as told by your health care provider.  Avoid loud noises.   Return to your normal activities as told by your health care provider. Ask your health care provider what activities are safe for you.  Keep all follow-up visits as told by your health care provider. This is important. SEEK MEDICAL CARE IF:   You feel dizzy.   You develop new symptoms.   You vomit or feel nauseous.   You have a fever.  SEEK IMMEDIATE MEDICAL CARE IF:  You develop sudden changes in your vision.   You have severe ear pain.   You have new or increased weakness.  You have a severe headache.   This information is not intended to replace advice given to you by your health care provider. Make sure you discuss any questions you have with your health care provider.   Document Released: 04/24/2005 Document Revised: 01/13/2015 Document Reviewed: 09/09/2014 Elsevier Interactive Patient Education [employer].

## 2015-11-29 NOTE — Telephone Encounter (Signed)
Call to [facility] and left VM Call to patient's  home number and spoke to [place] Stated he stayed with her until 1am last pm. Came over at 11am today; States she is better; walking some better w walker; Started Oxybutynin x 2 days ago and has not voided in depends. States he tried to fix smoothie but she didn't like it but will try again; tomorrow; ate some "sweet and sour chicken" 1/2 boost; banana; then gave her 2 cookies; and ate some ice cream today.  She is using the walker in home. Getting the toilet better and he is helping her off. States she has improved over the last couple of days from last week.   Reviewed the importance of her eating; BMI difficult to determine due to variances in stated height;  Richardson Landry states DSS is awaiting medicaid apt for SNF placement;   Richardson Landry stated he cannot get her to MD visit at 7:45pm. States she will not get up; Gets very agitated; Difficult to go anywhere prior to 11am; Brother helps him with transport around 12noon. Richardson Landry is the primary caregiver. She is staying by herself at hs; 1am and until on the brothers comes in the am. Has not failed; walking some better with the walker.   Steve's cell is [phone].

## 2015-11-29 NOTE — Telephone Encounter (Signed)
I agree home health may be valuable but would need face to face. In addition- need to eval given acute change in symptoms. Can we have them come in 7:45 on Thursday? I say that early because likely need longer than 15 mins  I already booked an 8 for tomorrow and Wednesday (or should say Wednesday in process of being booked)

## 2015-11-29 NOTE — Telephone Encounter (Signed)
Home # fast busy signal will try EC. LMOM for jimmy to callback

## 2015-11-30 NOTE — Telephone Encounter (Signed)
Called son Richardson Landry and left messge asking for return phone call. I offered Thursday at noon as an appointment option. Awaiting return phone call.

## 2015-11-30 NOTE — Telephone Encounter (Signed)
Pt has been sch for thur at 12 noon ok per steve

## 2015-12-02 ENCOUNTER — Encounter: Payer: Self-pay | Admitting: Family Medicine

## 2015-12-02 ENCOUNTER — Ambulatory Visit (INDEPENDENT_AMBULATORY_CARE_PROVIDER_SITE_OTHER): Payer: Medicare Other | Admitting: Family Medicine

## 2015-12-02 VITALS — BP 108/72 | HR 99 | Temp 97.9°F | Wt 93.2 lb

## 2015-12-02 DIAGNOSIS — R413 Other amnesia: Secondary | ICD-10-CM

## 2015-12-02 DIAGNOSIS — L8992 Pressure ulcer of unspecified site, stage 2: Secondary | ICD-10-CM

## 2015-12-02 DIAGNOSIS — R531 Weakness: Secondary | ICD-10-CM | POA: Diagnosis not present

## 2015-12-02 DIAGNOSIS — E46 Unspecified protein-calorie malnutrition: Secondary | ICD-10-CM | POA: Diagnosis not present

## 2015-12-02 DIAGNOSIS — F0390 Unspecified dementia without behavioral disturbance: Secondary | ICD-10-CM

## 2015-12-02 LAB — CBC
HCT: 32.1 % — ABNORMAL LOW (ref 36.0–46.0)
HEMOGLOBIN: 10.6 g/dL — AB (ref 12.0–15.0)
MCHC: 32.8 g/dL (ref 30.0–36.0)
MCV: 96.8 fl (ref 78.0–100.0)
PLATELETS: 213 10*3/uL (ref 150.0–400.0)
RBC: 3.32 Mil/uL — AB (ref 3.87–5.11)
RDW: 14 % (ref 11.5–15.5)
WBC: 6.1 10*3/uL (ref 4.0–10.5)

## 2015-12-02 LAB — COMPREHENSIVE METABOLIC PANEL
ALBUMIN: 3.4 g/dL — AB (ref 3.5–5.2)
ALK PHOS: 83 U/L (ref 39–117)
ALT: 12 U/L (ref 0–35)
AST: 14 U/L (ref 0–37)
BILIRUBIN TOTAL: 0.3 mg/dL (ref 0.2–1.2)
BUN: 17 mg/dL (ref 6–23)
CALCIUM: 9.1 mg/dL (ref 8.4–10.5)
CO2: 32 mEq/L (ref 19–32)
CREATININE: 0.64 mg/dL (ref 0.40–1.20)
Chloride: 102 mEq/L (ref 96–112)
GFR: 92.91 mL/min (ref >60.00)
Glucose, Bld: 95 mg/dL (ref 70–99)
Potassium: 4.5 mEq/L (ref 3.5–5.1)
Sodium: 140 mEq/L (ref 135–145)
TOTAL PROTEIN: 6.2 g/dL (ref 6.0–8.3)

## 2015-12-02 LAB — TSH: TSH: 0.71 u[IU]/mL (ref 0.35–4.50)

## 2015-12-02 LAB — VITAMIN B12: VITAMIN B 12: 623 pg/mL (ref 211–911)

## 2015-12-02 NOTE — Progress Notes (Signed)
Subjective:  Diane Little is a 82 y.o. year old very pleasant female patient who presents for/with See problem oriented charting ROS- level 5 caveat applies due to dementia but she and family deny chest pain, shortness of breath, edema. Does have some skin breakdown.see any ROS included in HPI as well.   Past Medical History-  Patient Active Problem List   Diagnosis Date Noted  . Dementia 07/13/2014    Priority: High  . Protein-calorie malnutrition ([place]) 09/08/2013    Priority: Medium  . Overactive bladder 02/28/2012    Priority: Medium  . Iron deficiency anemia 12/17/2006    Priority: Medium  . Osteoporosis 12/17/2006    Priority: Medium  . Aortic sclerosis ([place]) 05/27/2014    Priority: Low  . Total knee replacement status 06/10/2007    Priority: Low  . Allergic rhinitis 12/17/2006    Priority: Low    Medications- reviewed and updated Current Outpatient Prescriptions  Medication Sig Dispense Refill  . acetaminophen (TYLENOL) 650 MG CR tablet Take 650 mg by mouth every 8 (eight) hours as needed for pain. Reported on 09/14/2015    . calcium-vitamin D (OSCAL WITH D 500-200) 500-200 MG-UNIT per tablet Take 1 tablet by mouth 2 (two) times daily.      . feeding supplement (BOOST HIGH PROTEIN) LIQD Take 1 Container by mouth 2 (two) times daily between meals.    . ferrous sulfate 325 (65 FE) MG tablet Take 1 tablet (325 mg total) by mouth daily with breakfast. 30 tablet 1  . fluticasone (FLONASE) 50 MCG/ACT nasal spray Place 2 sprays into the nose daily as needed. 16 g 5  . Multiple Vitamin (MULTIVITAMIN WITH MINERALS) TABS Take 1 tablet by mouth daily.    . polyethylene glycol (MIRALAX / GLYCOLAX) packet Take 17 g by mouth daily as needed. 14 each 0  . pyridoxine (B-6) 50 MG tablet Take 50 mg by mouth daily.      . vitamin B-12 (CYANOCOBALAMIN) 250 MCG tablet Take 250 mcg by mouth daily.      Marland Kitchen zinc gluconate 50 MG tablet Take 50 mg by mouth daily.       No current  facility-administered medications for this visit.     Objective: BP 108/72 (BP Location: Left Arm, Patient Position: Sitting, Cuff Size: Normal)   Pulse 99   Temp 97.9 F (36.6 C) (Oral)   Wt 93 lb 3.2 oz (42.3 kg)   SpO2 96%   BMI 15.04 kg/m  Gen: NAD, resting comfortably CV: RRR no murmurs rubs or gallops Lungs: CTAB no crackles, wheeze, rhonchi Abdomen: soft/nontender/nondistended/normal bowel sounds. No rebound or guarding.  Ext: no edema Skin: warm, dry, blaching red rash throughout buttocks but on left upper side small < 1 cm stage II pressure ulcer noted, without signs of infection Neuro: grossly normal, moves all extremities  Assessment/Plan:  Dementia Protein calorie malnutrition severe Failure to thrive S: Patient with continued decline. In march MMSE 24/30, at [place] was 20/30 with Wynetta Fines, RN. Family states they have really noted a decline and previously she could care for herself at home and now without someone there to get her to eat she wont even eat. Between 2 sons they are now there at least 75% of the time. They are working on [employer] and potential placement. She has lost signifcant amount of weight and is now under 100 lbs. She tends to be more repetitive. Deny any recent changes to suggest stroke as seen in ROS but change in  memory is notable. She had stopped bathing herself as well. Baseline incontinence not worsened- had been on oxybutynin for OAB. Of note, family missed neurology appointment for workup of memory loss.   At baseline already has protein calorie malnutrition and has now worsened. Getting her to take 2 boost a day between meals .She had been very weak as well before family started staying with her and feeding her regularly- would just drop to toilet and last visit had to have wheelchair.  A: 82 year female with memory loss with progressive decline in MMSE suggestive of dementia- vascular vs. alzheimers likely high on list. Decline in condition with  FTT and worsening protein calorie malnutrition P: - stop oxybutynin, claritin to avoid anticholinergic effects -reversible causes of dementia workup as below including MRI. Also likely urine culture at follow up.  - home health with order details as below- PT/OT with reduced mobility. Nursing with protein calorie malnutrition with stage II ulcer decubitus- could also likely use help with setting up med management with sons. Protein supplements may be available as well through home health. Also social work to facilitate potential placement and medicaid transition.  - keep scheduled august appointment- if continued decline consider hospice  Orders Placed This Encounter  Procedures  . MR Brain W Wo Contrast    Standing Status:   Future    Standing Expiration Date:   02/01/2017    Order Specific Question:   If indicated for the ordered procedure, I authorize the administration of contrast media per Radiology protocol    Answer:   Yes    Order Specific Question:   Reason for Exam (SYMPTOM  OR DIAGNOSIS REQUIRED)    Answer:   memory loss    Order Specific Question:   Preferred imaging location?    Answer:   [facility] (table limit-550lbs)    Order Specific Question:   What is the patient's sedation requirement?    Answer:   No Sedation    Order Specific Question:   Does the patient have a pacemaker or implanted devices?    Answer:   No  . CBC    [place]  . Comprehensive metabolic panel    [place]  . RPR    [facility]  . HIV antibody    [facility]  . TSH    [place]  . Vitamin B12  . Ambulatory referral to Home Health    Referral Priority:   Routine    Referral Type:   Home Health Care    Referral Reason:   Specialty Services Required    Requested Specialty:   [place]    Number of Visits Requested:   1   The duration of face-to-face time during this visit was 30 minutes. Greater than 50% of this time was spent in counseling, explanation of diagnosis, planning of further  management, and/or coordination of care.    Return precautions advised.  Garret Reddish, MD        Orders for home health:   Please evaluate Akaila Palanca for admission to [facility].  Disciplines requested: Nursing, Physical Therapy, Occupational Therapy, Medical Social Work and [place] to provide: [facility], Strengthening Exercises and Evaluate  Physician to follow patient's care (the person listed here will be responsible for signing ongoing orders): PCP  Requested Start of Care Date: Tomorrow  I certify that this patient is under my care and that I, or a Nurse Practitioner or Physician's Assistant working with me,  had a face-to-face encounter that meets the physician face-to-face requirements with patient on 12/02/15. The encounter with the patient was in whole, or in part for the following medical condition(s) which is the primary reason for home health care (List medical condition). Memory loss likely dementia, general weakness, protein calorie malnutrition, stage II decubitus pressure ulcer

## 2015-12-02 NOTE — Patient Instructions (Signed)
Labs today  Ordered MRI but may take weeks  May want to reach out to [facility] [employer] [facility] to see if that may help  Home health ordered- hopeful they can come out in next few days  Keep august appointment- we will see how she is doing and consider hospice referral.   Continue boost 2x a day in addition to meals- glad strength is improving with both of your help

## 2015-12-02 NOTE — Assessment & Plan Note (Addendum)
Protein calorie malnutrition severe Failure to thrive S: Patient with continued decline. In march MMSE 24/30, at [place] was 20/30 with Wynetta Fines, RN. Family states they have really noted a decline and previously she could care for herself at home and now without someone there to get her to eat she wont even eat. Between 2 sons they are now there at least 75% of the time. They are working on [employer] and potential placement. She has lost signifcant amount of weight and is now under 100 lbs. She tends to be more repetitive. Deny any recent changes to suggest stroke as seen in ROS but change in memory is notable. She had stopped bathing herself as well. Baseline incontinence not worsened- had been on oxybutynin for OAB. Of note, family missed neurology appointment for workup of memory loss.   At baseline already has protein calorie malnutrition and has now worsened. Getting her to take 2 boost a day between meals .She had been very weak as well before family started staying with her and feeding her regularly- would just drop to toilet and last visit had to have wheelchair.  A: 82 year female with memory loss with progressive decline in MMSE suggestive of dementia- vascular vs. alzheimers likely high on list. Decline in condition with FTT and worsening protein calorie malnutrition P: - stop oxybutynin, claritin to avoid anticholinergic effects -reversible causes of dementia workup as below including MRI. Also likely urine culture at follow up.  - home health with order details as below- PT/OT with reduced mobility. Nursing with protein calorie malnutrition with stage II ulcer decubitus- could also likely use help with setting up med management with sons. Protein supplements may be available as well through home health. Also social work to facilitate potential placement and medicaid transition.  - keep scheduled august appointment- if continued decline consider hospice

## 2015-12-03 LAB — HIV ANTIBODY (ROUTINE TESTING W REFLEX): HIV: NONREACTIVE

## 2015-12-03 LAB — RPR

## 2015-12-04 DIAGNOSIS — M810 Age-related osteoporosis without current pathological fracture: Secondary | ICD-10-CM | POA: Diagnosis not present

## 2015-12-04 DIAGNOSIS — M6281 Muscle weakness (generalized): Secondary | ICD-10-CM | POA: Diagnosis not present

## 2015-12-04 DIAGNOSIS — Z96659 Presence of unspecified artificial knee joint: Secondary | ICD-10-CM | POA: Diagnosis not present

## 2015-12-04 DIAGNOSIS — E43 Unspecified severe protein-calorie malnutrition: Secondary | ICD-10-CM | POA: Diagnosis not present

## 2015-12-04 DIAGNOSIS — L89150 Pressure ulcer of sacral region, unstageable: Secondary | ICD-10-CM | POA: Diagnosis not present

## 2015-12-04 DIAGNOSIS — R2689 Other abnormalities of gait and mobility: Secondary | ICD-10-CM | POA: Diagnosis not present

## 2015-12-04 DIAGNOSIS — D509 Iron deficiency anemia, unspecified: Secondary | ICD-10-CM | POA: Diagnosis not present

## 2015-12-04 DIAGNOSIS — I10 Essential (primary) hypertension: Secondary | ICD-10-CM | POA: Diagnosis not present

## 2015-12-04 DIAGNOSIS — R627 Adult failure to thrive: Secondary | ICD-10-CM | POA: Diagnosis not present

## 2015-12-07 DIAGNOSIS — Z96659 Presence of unspecified artificial knee joint: Secondary | ICD-10-CM | POA: Diagnosis not present

## 2015-12-07 DIAGNOSIS — R627 Adult failure to thrive: Secondary | ICD-10-CM | POA: Diagnosis not present

## 2015-12-07 DIAGNOSIS — E43 Unspecified severe protein-calorie malnutrition: Secondary | ICD-10-CM | POA: Diagnosis not present

## 2015-12-07 DIAGNOSIS — L89150 Pressure ulcer of sacral region, unstageable: Secondary | ICD-10-CM | POA: Diagnosis not present

## 2015-12-07 DIAGNOSIS — I10 Essential (primary) hypertension: Secondary | ICD-10-CM | POA: Diagnosis not present

## 2015-12-07 DIAGNOSIS — M810 Age-related osteoporosis without current pathological fracture: Secondary | ICD-10-CM | POA: Diagnosis not present

## 2015-12-07 DIAGNOSIS — Z4800 Encounter for change or removal of nonsurgical wound dressing: Secondary | ICD-10-CM | POA: Diagnosis not present

## 2015-12-07 DIAGNOSIS — D509 Iron deficiency anemia, unspecified: Secondary | ICD-10-CM | POA: Diagnosis not present

## 2015-12-07 DIAGNOSIS — R2689 Other abnormalities of gait and mobility: Secondary | ICD-10-CM | POA: Diagnosis not present

## 2015-12-10 ENCOUNTER — Telehealth: Payer: Self-pay | Admitting: Family Medicine

## 2015-12-10 DIAGNOSIS — I10 Essential (primary) hypertension: Secondary | ICD-10-CM | POA: Diagnosis not present

## 2015-12-10 DIAGNOSIS — R627 Adult failure to thrive: Secondary | ICD-10-CM | POA: Diagnosis not present

## 2015-12-10 DIAGNOSIS — E43 Unspecified severe protein-calorie malnutrition: Secondary | ICD-10-CM | POA: Diagnosis not present

## 2015-12-10 DIAGNOSIS — R2689 Other abnormalities of gait and mobility: Secondary | ICD-10-CM | POA: Diagnosis not present

## 2015-12-10 DIAGNOSIS — L89150 Pressure ulcer of sacral region, unstageable: Secondary | ICD-10-CM | POA: Diagnosis not present

## 2015-12-10 DIAGNOSIS — M810 Age-related osteoporosis without current pathological fracture: Secondary | ICD-10-CM | POA: Diagnosis not present

## 2015-12-10 DIAGNOSIS — Z4800 Encounter for change or removal of nonsurgical wound dressing: Secondary | ICD-10-CM | POA: Diagnosis not present

## 2015-12-10 DIAGNOSIS — Z96659 Presence of unspecified artificial knee joint: Secondary | ICD-10-CM | POA: Diagnosis not present

## 2015-12-10 DIAGNOSIS — D509 Iron deficiency anemia, unspecified: Secondary | ICD-10-CM | POA: Diagnosis not present

## 2015-12-10 NOTE — Telephone Encounter (Signed)
Spoke with Sharyn Lull and verbal order provided as requested. Patient will be seen in a different Medicare week which is why a new order is needed.

## 2015-12-10 NOTE — Telephone Encounter (Signed)
Called and left a voicemail message with verbal orders for PT as requested for mobility, balance, and gait. Left a call back number with my direct extension if there are any questions.

## 2015-12-10 NOTE — Telephone Encounter (Signed)
Diane Little would like verbal orders for home health PT 1 wk /1  2wk /3 1 wk/1 For mobility, balance and gait

## 2015-12-10 NOTE — Telephone Encounter (Signed)
Pt does not want to have OT evaluation today. Pt would like OT evaluation  on 12-14-15 are later . [place] will see pt on 12-14-15 or later. Sharyn Lull needs verbal order

## 2015-12-13 NOTE — Telephone Encounter (Signed)
Yes, verbal ok

## 2015-12-13 NOTE — Telephone Encounter (Signed)
Yes may provide

## 2015-12-14 DIAGNOSIS — I10 Essential (primary) hypertension: Secondary | ICD-10-CM | POA: Diagnosis not present

## 2015-12-14 DIAGNOSIS — Z96659 Presence of unspecified artificial knee joint: Secondary | ICD-10-CM | POA: Diagnosis not present

## 2015-12-14 DIAGNOSIS — R627 Adult failure to thrive: Secondary | ICD-10-CM | POA: Diagnosis not present

## 2015-12-14 DIAGNOSIS — M810 Age-related osteoporosis without current pathological fracture: Secondary | ICD-10-CM | POA: Diagnosis not present

## 2015-12-14 DIAGNOSIS — Z4800 Encounter for change or removal of nonsurgical wound dressing: Secondary | ICD-10-CM | POA: Diagnosis not present

## 2015-12-14 DIAGNOSIS — E43 Unspecified severe protein-calorie malnutrition: Secondary | ICD-10-CM | POA: Diagnosis not present

## 2015-12-14 DIAGNOSIS — D509 Iron deficiency anemia, unspecified: Secondary | ICD-10-CM | POA: Diagnosis not present

## 2015-12-14 DIAGNOSIS — R2689 Other abnormalities of gait and mobility: Secondary | ICD-10-CM | POA: Diagnosis not present

## 2015-12-14 DIAGNOSIS — L89150 Pressure ulcer of sacral region, unstageable: Secondary | ICD-10-CM | POA: Diagnosis not present

## 2015-12-15 DIAGNOSIS — R2689 Other abnormalities of gait and mobility: Secondary | ICD-10-CM | POA: Diagnosis not present

## 2015-12-15 DIAGNOSIS — D509 Iron deficiency anemia, unspecified: Secondary | ICD-10-CM | POA: Diagnosis not present

## 2015-12-15 DIAGNOSIS — M810 Age-related osteoporosis without current pathological fracture: Secondary | ICD-10-CM | POA: Diagnosis not present

## 2015-12-15 DIAGNOSIS — L89150 Pressure ulcer of sacral region, unstageable: Secondary | ICD-10-CM | POA: Diagnosis not present

## 2015-12-15 DIAGNOSIS — R627 Adult failure to thrive: Secondary | ICD-10-CM | POA: Diagnosis not present

## 2015-12-15 DIAGNOSIS — Z96659 Presence of unspecified artificial knee joint: Secondary | ICD-10-CM | POA: Diagnosis not present

## 2015-12-15 DIAGNOSIS — I10 Essential (primary) hypertension: Secondary | ICD-10-CM | POA: Diagnosis not present

## 2015-12-15 DIAGNOSIS — Z4800 Encounter for change or removal of nonsurgical wound dressing: Secondary | ICD-10-CM | POA: Diagnosis not present

## 2015-12-15 DIAGNOSIS — E43 Unspecified severe protein-calorie malnutrition: Secondary | ICD-10-CM | POA: Diagnosis not present

## 2015-12-16 ENCOUNTER — Telehealth: Payer: Self-pay | Admitting: Family Medicine

## 2015-12-16 DIAGNOSIS — L89150 Pressure ulcer of sacral region, unstageable: Secondary | ICD-10-CM | POA: Diagnosis not present

## 2015-12-16 DIAGNOSIS — Z96659 Presence of unspecified artificial knee joint: Secondary | ICD-10-CM | POA: Diagnosis not present

## 2015-12-16 DIAGNOSIS — D509 Iron deficiency anemia, unspecified: Secondary | ICD-10-CM | POA: Diagnosis not present

## 2015-12-16 DIAGNOSIS — I10 Essential (primary) hypertension: Secondary | ICD-10-CM | POA: Diagnosis not present

## 2015-12-16 DIAGNOSIS — R627 Adult failure to thrive: Secondary | ICD-10-CM | POA: Diagnosis not present

## 2015-12-16 DIAGNOSIS — R2689 Other abnormalities of gait and mobility: Secondary | ICD-10-CM | POA: Diagnosis not present

## 2015-12-16 DIAGNOSIS — Z4800 Encounter for change or removal of nonsurgical wound dressing: Secondary | ICD-10-CM | POA: Diagnosis not present

## 2015-12-16 DIAGNOSIS — E43 Unspecified severe protein-calorie malnutrition: Secondary | ICD-10-CM | POA: Diagnosis not present

## 2015-12-16 DIAGNOSIS — M810 Age-related osteoporosis without current pathological fracture: Secondary | ICD-10-CM | POA: Diagnosis not present

## 2015-12-16 NOTE — Telephone Encounter (Signed)
°  Diane Little with [facility] home health call to ask for verbal orders for 2 times a week for 3 weeks   [phone]

## 2015-12-17 DIAGNOSIS — F0390 Unspecified dementia without behavioral disturbance: Secondary | ICD-10-CM

## 2015-12-17 DIAGNOSIS — Z4800 Encounter for change or removal of nonsurgical wound dressing: Secondary | ICD-10-CM

## 2015-12-17 DIAGNOSIS — Z96659 Presence of unspecified artificial knee joint: Secondary | ICD-10-CM

## 2015-12-17 DIAGNOSIS — M810 Age-related osteoporosis without current pathological fracture: Secondary | ICD-10-CM

## 2015-12-17 DIAGNOSIS — R2689 Other abnormalities of gait and mobility: Secondary | ICD-10-CM

## 2015-12-17 DIAGNOSIS — R627 Adult failure to thrive: Secondary | ICD-10-CM | POA: Diagnosis not present

## 2015-12-17 DIAGNOSIS — D509 Iron deficiency anemia, unspecified: Secondary | ICD-10-CM | POA: Diagnosis not present

## 2015-12-17 DIAGNOSIS — E43 Unspecified severe protein-calorie malnutrition: Secondary | ICD-10-CM | POA: Diagnosis not present

## 2015-12-17 DIAGNOSIS — L89150 Pressure ulcer of sacral region, unstageable: Secondary | ICD-10-CM | POA: Diagnosis not present

## 2015-12-17 DIAGNOSIS — I10 Essential (primary) hypertension: Secondary | ICD-10-CM

## 2015-12-17 NOTE — Telephone Encounter (Signed)
Sharyn Lull calling again to receive those verbal orders and they are not able to see her until the orders are approved.

## 2015-12-17 NOTE — Telephone Encounter (Signed)
Verbal order provided for OT  2 times a week for 3 weeks as requested.

## 2015-12-20 ENCOUNTER — Encounter: Payer: Self-pay | Admitting: Family Medicine

## 2015-12-20 ENCOUNTER — Ambulatory Visit (INDEPENDENT_AMBULATORY_CARE_PROVIDER_SITE_OTHER): Payer: Medicare Other | Admitting: Family Medicine

## 2015-12-20 VITALS — BP 108/70 | HR 88 | Temp 98.1°F | Wt 96.6 lb

## 2015-12-20 DIAGNOSIS — R627 Adult failure to thrive: Secondary | ICD-10-CM

## 2015-12-20 DIAGNOSIS — R413 Other amnesia: Secondary | ICD-10-CM

## 2015-12-20 DIAGNOSIS — E46 Unspecified protein-calorie malnutrition: Secondary | ICD-10-CM | POA: Diagnosis not present

## 2015-12-20 DIAGNOSIS — R32 Unspecified urinary incontinence: Secondary | ICD-10-CM

## 2015-12-20 DIAGNOSIS — E43 Unspecified severe protein-calorie malnutrition: Secondary | ICD-10-CM

## 2015-12-20 LAB — CBC
HCT: 31.9 % — ABNORMAL LOW (ref 36.0–46.0)
HEMOGLOBIN: 10.7 g/dL — AB (ref 12.0–15.0)
MCHC: 33.6 g/dL (ref 30.0–36.0)
MCV: 95.5 fl (ref 78.0–100.0)
Platelets: 219 10*3/uL (ref 150.0–400.0)
RBC: 3.34 Mil/uL — ABNORMAL LOW (ref 3.87–5.11)
RDW: 14.3 % (ref 11.5–15.5)
WBC: 5.3 10*3/uL (ref 4.0–10.5)

## 2015-12-20 NOTE — Progress Notes (Signed)
Pre visit review using our clinic review tool, if applicable. No additional management support is needed unless otherwise documented below in the visit note. 

## 2015-12-20 NOTE — Patient Instructions (Signed)
Labs before you leave  Try to get MRI set up before you leave  6 week follow up

## 2015-12-20 NOTE — Telephone Encounter (Signed)
Thanks for informing them of my verbal order

## 2015-12-20 NOTE — Progress Notes (Signed)
Subjective:  Diane Little is a 82 y.o. year old very pleasant female patient who presents for/with See problem oriented charting ROS- level 5 caveat applies due to dementia but she and family deny chest pain, shortness of breath, edema. resolved skin breakdown.see any ROS included in HPI as well.   Past Medical History-  Patient Active Problem List   Diagnosis Date Noted  . Dementia 07/13/2014    Priority: High  . Protein-calorie malnutrition ([place]) 09/08/2013    Priority: Medium  . Overactive bladder 02/28/2012    Priority: Medium  . Iron deficiency anemia 12/17/2006    Priority: Medium  . Osteoporosis 12/17/2006    Priority: Medium  . Aortic sclerosis ([place]) 05/27/2014    Priority: Low  . Total knee replacement status 06/10/2007    Priority: Low  . Allergic rhinitis 12/17/2006    Priority: Low    Medications- reviewed and updated Current Outpatient Prescriptions  Medication Sig Dispense Refill  . acetaminophen (TYLENOL) 650 MG CR tablet Take 650 mg by mouth every 8 (eight) hours as needed for pain. Reported on 09/14/2015    . calcium-vitamin D (OSCAL WITH D 500-200) 500-200 MG-UNIT per tablet Take 1 tablet by mouth 2 (two) times daily.      . feeding supplement (BOOST HIGH PROTEIN) LIQD Take 1 Container by mouth 2 (two) times daily between meals.    . ferrous sulfate 325 (65 FE) MG tablet Take 1 tablet (325 mg total) by mouth daily with breakfast. 30 tablet 1  . fluticasone (FLONASE) 50 MCG/ACT nasal spray Place 2 sprays into the nose daily as needed. 16 g 5  . Multiple Vitamin (MULTIVITAMIN WITH MINERALS) TABS Take 1 tablet by mouth daily.    . polyethylene glycol (MIRALAX / GLYCOLAX) packet Take 17 g by mouth daily as needed. 14 each 0  . pyridoxine (B-6) 50 MG tablet Take 50 mg by mouth daily.      . vitamin B-12 (CYANOCOBALAMIN) 250 MCG tablet Take 250 mcg by mouth daily.      Marland Kitchen zinc gluconate 50 MG tablet Take 50 mg by mouth daily.       No current  facility-administered medications for this visit.     Objective: BP 108/70 (BP Location: Left Arm, Patient Position: Sitting, Cuff Size: Normal)   Pulse 88   Temp 98.1 F (36.7 C) (Oral)   Wt 96 lb 9.6 oz (43.8 kg)   SpO2 97%   BMI 15.59 kg/m  Gen: NAD, resting comfortably CV: RRR 3/6 SEM murmurs rubs or gallops Lungs: CTAB no crackles, wheeze, rhonchi Abdomen: soft/nontender/nondistended/normal bowel sounds. No rebound or guarding.  Ext: trace edema on right, 1+ on left, no calf pain Skin: warm, dry, blanching red rash throughout buttocks. Prior stage II ulcer now gone.  Neuro: grossly normal, moves all extremities  Assessment/Plan:  Dementia Protein calorie malnutrition severe Failure to thrive S: From 12/02/15 HPI: " Patient with continued decline. In march MMSE 24/30, at [place] was 20/30 with Wynetta Fines, RN. Family states they have really noted a decline and previously she could care for herself at home and now without someone there to get her to eat she wont even eat. Between 2 sons they are now there at least 75% of the time. They are working on [employer] and potential placement. She has lost signifcant amount of weight and is now under 100 lbs. She tends to be more repetitive. Deny any recent changes to suggest stroke as seen in ROS but change in  memory is notable. She had stopped bathing herself as well. Baseline incontinence not worsened- had been on oxybutynin for OAB. Of note, family missed neurology appointment for workup of memory loss.   At baseline already has protein calorie malnutrition and has now worsened. Getting her to take 2 boost a day between meals .She had been very weak as well before family started staying with her and feeding her regularly- would just drop to toilet and last visit had to have wheelchair. "  Sons still in home 75% of time.  Still working to get medicaid through for potential placement.  Home health coming ot house. Doing some exercise, strength  improving.   Memory loss has stabilized. Similar repetitiveness.   Weight is fortunately up 3 lbs. She has been doing 2 boost a day still. Seems to be helping strength Wt Readings from Last 3 Encounters:  12/20/15 96 lb 9.6 oz (43.8 kg)  12/02/15 93 lb 3.2 oz (42.3 kg)  11/26/15 103 lb (46.7 kg)   We stopped oxybutynin for OAB- incontinence has been about the same.Marland Kitchen  Pending MRI brain , Roselyn Reef to help set this up    A: 82 year female with memory loss with progressive decline in MMSE suggestive of dementia- vascular vs. alzheimers likely high on list. Decline in condition with FTT and worsening protein calorie malnutrition as of last visit- now improved P: -  Remain off oxybutynin -reversible causes of dementia was negative except still pending MRI. Also get urine culture today- doubt cause.  - continue PT/OT. Nursing has left home at this point.   - 6 week follow up - repeat CBC with slightly worsened anemia  Orders Placed This Encounter  Procedures  . Urine culture    [facility]  . CBC    [place]   The duration of face-to-face time during this visit was 20 minutes. Greater than 50% of this time was spent in counseling, explanation of diagnosis, planning of further management, and/or coordination of care.   Return precautions advised.  Garret Reddish, MD

## 2015-12-22 DIAGNOSIS — L89150 Pressure ulcer of sacral region, unstageable: Secondary | ICD-10-CM | POA: Diagnosis not present

## 2015-12-22 DIAGNOSIS — I10 Essential (primary) hypertension: Secondary | ICD-10-CM | POA: Diagnosis not present

## 2015-12-22 DIAGNOSIS — D509 Iron deficiency anemia, unspecified: Secondary | ICD-10-CM | POA: Diagnosis not present

## 2015-12-22 DIAGNOSIS — M810 Age-related osteoporosis without current pathological fracture: Secondary | ICD-10-CM | POA: Diagnosis not present

## 2015-12-22 DIAGNOSIS — R627 Adult failure to thrive: Secondary | ICD-10-CM | POA: Diagnosis not present

## 2015-12-22 DIAGNOSIS — R2689 Other abnormalities of gait and mobility: Secondary | ICD-10-CM | POA: Diagnosis not present

## 2015-12-22 DIAGNOSIS — E43 Unspecified severe protein-calorie malnutrition: Secondary | ICD-10-CM | POA: Diagnosis not present

## 2015-12-22 DIAGNOSIS — Z96659 Presence of unspecified artificial knee joint: Secondary | ICD-10-CM | POA: Diagnosis not present

## 2015-12-22 DIAGNOSIS — Z4800 Encounter for change or removal of nonsurgical wound dressing: Secondary | ICD-10-CM | POA: Diagnosis not present

## 2015-12-22 LAB — URINE CULTURE: Organism ID, Bacteria: 10000

## 2015-12-23 DIAGNOSIS — Z96659 Presence of unspecified artificial knee joint: Secondary | ICD-10-CM | POA: Diagnosis not present

## 2015-12-23 DIAGNOSIS — D509 Iron deficiency anemia, unspecified: Secondary | ICD-10-CM | POA: Diagnosis not present

## 2015-12-23 DIAGNOSIS — E43 Unspecified severe protein-calorie malnutrition: Secondary | ICD-10-CM | POA: Diagnosis not present

## 2015-12-23 DIAGNOSIS — L89150 Pressure ulcer of sacral region, unstageable: Secondary | ICD-10-CM | POA: Diagnosis not present

## 2015-12-23 DIAGNOSIS — M810 Age-related osteoporosis without current pathological fracture: Secondary | ICD-10-CM | POA: Diagnosis not present

## 2015-12-23 DIAGNOSIS — Z4800 Encounter for change or removal of nonsurgical wound dressing: Secondary | ICD-10-CM | POA: Diagnosis not present

## 2015-12-23 DIAGNOSIS — R2689 Other abnormalities of gait and mobility: Secondary | ICD-10-CM | POA: Diagnosis not present

## 2015-12-23 DIAGNOSIS — R627 Adult failure to thrive: Secondary | ICD-10-CM | POA: Diagnosis not present

## 2015-12-23 DIAGNOSIS — I10 Essential (primary) hypertension: Secondary | ICD-10-CM | POA: Diagnosis not present

## 2015-12-24 DIAGNOSIS — L89150 Pressure ulcer of sacral region, unstageable: Secondary | ICD-10-CM | POA: Diagnosis not present

## 2015-12-24 DIAGNOSIS — I10 Essential (primary) hypertension: Secondary | ICD-10-CM | POA: Diagnosis not present

## 2015-12-24 DIAGNOSIS — M810 Age-related osteoporosis without current pathological fracture: Secondary | ICD-10-CM | POA: Diagnosis not present

## 2015-12-24 DIAGNOSIS — Z96659 Presence of unspecified artificial knee joint: Secondary | ICD-10-CM | POA: Diagnosis not present

## 2015-12-24 DIAGNOSIS — D509 Iron deficiency anemia, unspecified: Secondary | ICD-10-CM | POA: Diagnosis not present

## 2015-12-24 DIAGNOSIS — R627 Adult failure to thrive: Secondary | ICD-10-CM | POA: Diagnosis not present

## 2015-12-24 DIAGNOSIS — R2689 Other abnormalities of gait and mobility: Secondary | ICD-10-CM | POA: Diagnosis not present

## 2015-12-24 DIAGNOSIS — Z4800 Encounter for change or removal of nonsurgical wound dressing: Secondary | ICD-10-CM | POA: Diagnosis not present

## 2015-12-24 DIAGNOSIS — E43 Unspecified severe protein-calorie malnutrition: Secondary | ICD-10-CM | POA: Diagnosis not present

## 2015-12-27 DIAGNOSIS — L89150 Pressure ulcer of sacral region, unstageable: Secondary | ICD-10-CM | POA: Diagnosis not present

## 2015-12-27 DIAGNOSIS — E43 Unspecified severe protein-calorie malnutrition: Secondary | ICD-10-CM | POA: Diagnosis not present

## 2015-12-27 DIAGNOSIS — Z4800 Encounter for change or removal of nonsurgical wound dressing: Secondary | ICD-10-CM | POA: Diagnosis not present

## 2015-12-27 DIAGNOSIS — R2689 Other abnormalities of gait and mobility: Secondary | ICD-10-CM | POA: Diagnosis not present

## 2015-12-27 DIAGNOSIS — R627 Adult failure to thrive: Secondary | ICD-10-CM | POA: Diagnosis not present

## 2015-12-27 DIAGNOSIS — I10 Essential (primary) hypertension: Secondary | ICD-10-CM | POA: Diagnosis not present

## 2015-12-27 DIAGNOSIS — D509 Iron deficiency anemia, unspecified: Secondary | ICD-10-CM | POA: Diagnosis not present

## 2015-12-27 DIAGNOSIS — Z96659 Presence of unspecified artificial knee joint: Secondary | ICD-10-CM | POA: Diagnosis not present

## 2015-12-27 DIAGNOSIS — M810 Age-related osteoporosis without current pathological fracture: Secondary | ICD-10-CM | POA: Diagnosis not present

## 2015-12-29 ENCOUNTER — Other Ambulatory Visit: Payer: Self-pay | Admitting: Family Medicine

## 2015-12-29 ENCOUNTER — Ambulatory Visit
Admission: RE | Admit: 2015-12-29 | Discharge: 2015-12-29 | Disposition: A | Discharge status: Home / Self Care | Payer: Medicare Other | Source: Ambulatory Visit | Attending: Family Medicine | Admitting: Family Medicine

## 2015-12-29 DIAGNOSIS — R2689 Other abnormalities of gait and mobility: Secondary | ICD-10-CM | POA: Diagnosis not present

## 2015-12-29 DIAGNOSIS — M810 Age-related osteoporosis without current pathological fracture: Secondary | ICD-10-CM | POA: Diagnosis not present

## 2015-12-29 DIAGNOSIS — I10 Essential (primary) hypertension: Secondary | ICD-10-CM | POA: Diagnosis not present

## 2015-12-29 DIAGNOSIS — E43 Unspecified severe protein-calorie malnutrition: Secondary | ICD-10-CM | POA: Diagnosis not present

## 2015-12-29 DIAGNOSIS — R413 Other amnesia: Secondary | ICD-10-CM

## 2015-12-29 DIAGNOSIS — R627 Adult failure to thrive: Secondary | ICD-10-CM | POA: Diagnosis not present

## 2015-12-29 DIAGNOSIS — Z4800 Encounter for change or removal of nonsurgical wound dressing: Secondary | ICD-10-CM | POA: Diagnosis not present

## 2015-12-29 DIAGNOSIS — D509 Iron deficiency anemia, unspecified: Secondary | ICD-10-CM | POA: Diagnosis not present

## 2015-12-29 DIAGNOSIS — L89150 Pressure ulcer of sacral region, unstageable: Secondary | ICD-10-CM | POA: Diagnosis not present

## 2015-12-29 DIAGNOSIS — Z96659 Presence of unspecified artificial knee joint: Secondary | ICD-10-CM | POA: Diagnosis not present

## 2015-12-31 ENCOUNTER — Telehealth: Payer: Self-pay | Admitting: Family Medicine

## 2015-12-31 DIAGNOSIS — R2689 Other abnormalities of gait and mobility: Secondary | ICD-10-CM | POA: Diagnosis not present

## 2015-12-31 DIAGNOSIS — L89150 Pressure ulcer of sacral region, unstageable: Secondary | ICD-10-CM | POA: Diagnosis not present

## 2015-12-31 DIAGNOSIS — R627 Adult failure to thrive: Secondary | ICD-10-CM | POA: Diagnosis not present

## 2015-12-31 DIAGNOSIS — Z4800 Encounter for change or removal of nonsurgical wound dressing: Secondary | ICD-10-CM | POA: Diagnosis not present

## 2015-12-31 DIAGNOSIS — Z96659 Presence of unspecified artificial knee joint: Secondary | ICD-10-CM | POA: Diagnosis not present

## 2015-12-31 DIAGNOSIS — D509 Iron deficiency anemia, unspecified: Secondary | ICD-10-CM | POA: Diagnosis not present

## 2015-12-31 DIAGNOSIS — M810 Age-related osteoporosis without current pathological fracture: Secondary | ICD-10-CM | POA: Diagnosis not present

## 2015-12-31 DIAGNOSIS — I10 Essential (primary) hypertension: Secondary | ICD-10-CM | POA: Diagnosis not present

## 2015-12-31 DIAGNOSIS — E43 Unspecified severe protein-calorie malnutrition: Secondary | ICD-10-CM | POA: Diagnosis not present

## 2015-12-31 NOTE — Telephone Encounter (Signed)
Sharyn Lull with [facility] home health states pt has   been observed on multiple occasions pocketing her food while eating on left side of mouth. Pt will continue to put the food in her mouth until it gets huge and then pt coughs.  Sharyn Lull would like an order for speech therapy

## 2015-12-31 NOTE — Telephone Encounter (Signed)
Spoke with Sharyn Lull and provided Speech Therapy order as requested.

## 2016-01-03 DIAGNOSIS — Z96659 Presence of unspecified artificial knee joint: Secondary | ICD-10-CM | POA: Diagnosis not present

## 2016-01-03 DIAGNOSIS — L89150 Pressure ulcer of sacral region, unstageable: Secondary | ICD-10-CM | POA: Diagnosis not present

## 2016-01-03 DIAGNOSIS — D509 Iron deficiency anemia, unspecified: Secondary | ICD-10-CM | POA: Diagnosis not present

## 2016-01-03 DIAGNOSIS — R627 Adult failure to thrive: Secondary | ICD-10-CM | POA: Diagnosis not present

## 2016-01-03 DIAGNOSIS — E43 Unspecified severe protein-calorie malnutrition: Secondary | ICD-10-CM | POA: Diagnosis not present

## 2016-01-03 DIAGNOSIS — Z4800 Encounter for change or removal of nonsurgical wound dressing: Secondary | ICD-10-CM | POA: Diagnosis not present

## 2016-01-03 DIAGNOSIS — I10 Essential (primary) hypertension: Secondary | ICD-10-CM | POA: Diagnosis not present

## 2016-01-03 DIAGNOSIS — M810 Age-related osteoporosis without current pathological fracture: Secondary | ICD-10-CM | POA: Diagnosis not present

## 2016-01-03 DIAGNOSIS — R2689 Other abnormalities of gait and mobility: Secondary | ICD-10-CM | POA: Diagnosis not present

## 2016-01-05 DIAGNOSIS — I10 Essential (primary) hypertension: Secondary | ICD-10-CM | POA: Diagnosis not present

## 2016-01-05 DIAGNOSIS — E43 Unspecified severe protein-calorie malnutrition: Secondary | ICD-10-CM | POA: Diagnosis not present

## 2016-01-05 DIAGNOSIS — D509 Iron deficiency anemia, unspecified: Secondary | ICD-10-CM | POA: Diagnosis not present

## 2016-01-05 DIAGNOSIS — R627 Adult failure to thrive: Secondary | ICD-10-CM | POA: Diagnosis not present

## 2016-01-05 DIAGNOSIS — L89150 Pressure ulcer of sacral region, unstageable: Secondary | ICD-10-CM | POA: Diagnosis not present

## 2016-01-05 DIAGNOSIS — Z4800 Encounter for change or removal of nonsurgical wound dressing: Secondary | ICD-10-CM | POA: Diagnosis not present

## 2016-01-05 DIAGNOSIS — M810 Age-related osteoporosis without current pathological fracture: Secondary | ICD-10-CM | POA: Diagnosis not present

## 2016-01-05 DIAGNOSIS — R2689 Other abnormalities of gait and mobility: Secondary | ICD-10-CM | POA: Diagnosis not present

## 2016-01-05 DIAGNOSIS — Z96659 Presence of unspecified artificial knee joint: Secondary | ICD-10-CM | POA: Diagnosis not present

## 2016-01-05 NOTE — Telephone Encounter (Signed)
Sonal, speech therapist saw pt this week. would like to see pt 3 more times just to keep an eye on her progress, pt still having issue holding food in her mouth.  1 wk / 2 1 wk /1  Verbal please.

## 2016-01-06 DIAGNOSIS — Z4800 Encounter for change or removal of nonsurgical wound dressing: Secondary | ICD-10-CM | POA: Diagnosis not present

## 2016-01-06 DIAGNOSIS — M810 Age-related osteoporosis without current pathological fracture: Secondary | ICD-10-CM | POA: Diagnosis not present

## 2016-01-06 DIAGNOSIS — L89150 Pressure ulcer of sacral region, unstageable: Secondary | ICD-10-CM | POA: Diagnosis not present

## 2016-01-06 DIAGNOSIS — R2689 Other abnormalities of gait and mobility: Secondary | ICD-10-CM | POA: Diagnosis not present

## 2016-01-06 DIAGNOSIS — Z96659 Presence of unspecified artificial knee joint: Secondary | ICD-10-CM | POA: Diagnosis not present

## 2016-01-06 DIAGNOSIS — I10 Essential (primary) hypertension: Secondary | ICD-10-CM | POA: Diagnosis not present

## 2016-01-06 DIAGNOSIS — D509 Iron deficiency anemia, unspecified: Secondary | ICD-10-CM | POA: Diagnosis not present

## 2016-01-06 DIAGNOSIS — R627 Adult failure to thrive: Secondary | ICD-10-CM | POA: Diagnosis not present

## 2016-01-06 DIAGNOSIS — E43 Unspecified severe protein-calorie malnutrition: Secondary | ICD-10-CM | POA: Diagnosis not present

## 2016-01-06 NOTE — Telephone Encounter (Signed)
Thanks Jamie!

## 2016-01-06 NOTE — Telephone Encounter (Signed)
Called and provided verbal speech order as requested.

## 2016-01-13 DIAGNOSIS — R627 Adult failure to thrive: Secondary | ICD-10-CM | POA: Diagnosis not present

## 2016-01-13 DIAGNOSIS — D509 Iron deficiency anemia, unspecified: Secondary | ICD-10-CM | POA: Diagnosis not present

## 2016-01-13 DIAGNOSIS — E43 Unspecified severe protein-calorie malnutrition: Secondary | ICD-10-CM | POA: Diagnosis not present

## 2016-01-13 DIAGNOSIS — Z96659 Presence of unspecified artificial knee joint: Secondary | ICD-10-CM | POA: Diagnosis not present

## 2016-01-13 DIAGNOSIS — R2689 Other abnormalities of gait and mobility: Secondary | ICD-10-CM | POA: Diagnosis not present

## 2016-01-13 DIAGNOSIS — L89150 Pressure ulcer of sacral region, unstageable: Secondary | ICD-10-CM | POA: Diagnosis not present

## 2016-01-13 DIAGNOSIS — M810 Age-related osteoporosis without current pathological fracture: Secondary | ICD-10-CM | POA: Diagnosis not present

## 2016-01-13 DIAGNOSIS — I10 Essential (primary) hypertension: Secondary | ICD-10-CM | POA: Diagnosis not present

## 2016-01-13 DIAGNOSIS — Z4800 Encounter for change or removal of nonsurgical wound dressing: Secondary | ICD-10-CM | POA: Diagnosis not present

## 2016-01-14 DIAGNOSIS — D509 Iron deficiency anemia, unspecified: Secondary | ICD-10-CM | POA: Diagnosis not present

## 2016-01-14 DIAGNOSIS — M810 Age-related osteoporosis without current pathological fracture: Secondary | ICD-10-CM | POA: Diagnosis not present

## 2016-01-14 DIAGNOSIS — R2689 Other abnormalities of gait and mobility: Secondary | ICD-10-CM | POA: Diagnosis not present

## 2016-01-14 DIAGNOSIS — Z96659 Presence of unspecified artificial knee joint: Secondary | ICD-10-CM | POA: Diagnosis not present

## 2016-01-14 DIAGNOSIS — E43 Unspecified severe protein-calorie malnutrition: Secondary | ICD-10-CM | POA: Diagnosis not present

## 2016-01-14 DIAGNOSIS — L89150 Pressure ulcer of sacral region, unstageable: Secondary | ICD-10-CM | POA: Diagnosis not present

## 2016-01-14 DIAGNOSIS — I10 Essential (primary) hypertension: Secondary | ICD-10-CM | POA: Diagnosis not present

## 2016-01-14 DIAGNOSIS — R627 Adult failure to thrive: Secondary | ICD-10-CM | POA: Diagnosis not present

## 2016-01-14 DIAGNOSIS — Z4800 Encounter for change or removal of nonsurgical wound dressing: Secondary | ICD-10-CM | POA: Diagnosis not present

## 2016-01-20 DIAGNOSIS — Z4800 Encounter for change or removal of nonsurgical wound dressing: Secondary | ICD-10-CM | POA: Diagnosis not present

## 2016-01-20 DIAGNOSIS — L89150 Pressure ulcer of sacral region, unstageable: Secondary | ICD-10-CM | POA: Diagnosis not present

## 2016-01-20 DIAGNOSIS — R2689 Other abnormalities of gait and mobility: Secondary | ICD-10-CM | POA: Diagnosis not present

## 2016-01-20 DIAGNOSIS — E43 Unspecified severe protein-calorie malnutrition: Secondary | ICD-10-CM | POA: Diagnosis not present

## 2016-01-20 DIAGNOSIS — I10 Essential (primary) hypertension: Secondary | ICD-10-CM | POA: Diagnosis not present

## 2016-01-20 DIAGNOSIS — R627 Adult failure to thrive: Secondary | ICD-10-CM | POA: Diagnosis not present

## 2016-01-20 DIAGNOSIS — M810 Age-related osteoporosis without current pathological fracture: Secondary | ICD-10-CM | POA: Diagnosis not present

## 2016-01-20 DIAGNOSIS — Z96659 Presence of unspecified artificial knee joint: Secondary | ICD-10-CM | POA: Diagnosis not present

## 2016-01-20 DIAGNOSIS — D509 Iron deficiency anemia, unspecified: Secondary | ICD-10-CM | POA: Diagnosis not present

## 2016-01-26 ENCOUNTER — Ambulatory Visit (INDEPENDENT_AMBULATORY_CARE_PROVIDER_SITE_OTHER): Payer: Medicare Other | Admitting: Neurology

## 2016-01-26 ENCOUNTER — Encounter: Payer: Self-pay | Admitting: Neurology

## 2016-01-26 VITALS — BP 108/80 | HR 60 | Ht 63.0 in | Wt 96.0 lb

## 2016-01-26 DIAGNOSIS — F0280 Dementia in other diseases classified elsewhere without behavioral disturbance: Secondary | ICD-10-CM

## 2016-01-26 DIAGNOSIS — E46 Unspecified protein-calorie malnutrition: Secondary | ICD-10-CM

## 2016-01-26 DIAGNOSIS — G309 Alzheimer's disease, unspecified: Secondary | ICD-10-CM

## 2016-01-26 NOTE — Patient Instructions (Addendum)
[employer]:  [phone] Tel [place]:   [phone] www.senior-resources-[place].org/resources.cfm   Resources for common questions found under "Pathways & Protocols " www.senior-resources-[place].org/pathways/Pathways_Menu.htm  Resources for [place] [facility] and Assisted Living facilities: www.ncnursinghomeguide.com  For assistance with senior care, elder law, and estate planning (POA, medical directives): [employer] [address], [place]  [address] Tel: [phone] www.elderlawfirm.com   Life Alert Resources  Medical Alert Systems that can locate patients outside the home: http://mobilealertsystems.com/ (254) 131-8773 http://www.lifelinesys.com/content/lifeline-products/get-life-gosafe  586 480 8725 www.verizonwireless.com/sure/ 737 814 2104  Medial Alert systems that link to smart phones: http://www.lifelinesys.com/content/lifeline-products/response-app https://www.stanton.info/ RecycleRoad.pl.aspx  Alzheimer[employer] GPS Tracker: VoiceZap.is 814-310-9078   Please call my office if you would like to start any medication for dementia  Return to clinic as needed

## 2016-01-26 NOTE — Progress Notes (Signed)
[facility] Clinic Note - Initial Visit   Date: 01/26/16  Diane Little MRN: [id number] DOB: June 30, 1924   Dear Dr. Yong Channel:  Thank you for your kind referral of Pollyann Cocke for consultation of memory loss. Although her history is well known to you, please allow [place] to reiterate it for the purpose of our medical record. The patient was accompanied to the clinic by sons who also provides collateral information.     History of Present Illness: Diane Little is a 82 y.o. female with iron deficient anemia and protein calorie malnutrition presenting for evaluation of memory loss.    Starting around 2016, she started having difficulty with driving, specifically getting lost.  She has not been driving over the past two years.  Her family has noticed that she gradually started forgetting things, especially recent conversations or events.  She endorses having word-finding difficulty and misplacing objects frequently.  She used to be social and has become more withdrawn. She has not been to church since early 2017.  Earlier this year, she became very weak and unable to even leave her home which is most likely due to her skipping meals and malnutrition.  Her son started overseeing more of her duties at home.  He now takes care of meal preparation, medications, and finances.  She is able to bath, dress, and feed herself.  If her son does not prompt her to eat, she will forget to eat.  Since he has been encouraging her to eat, she has gained 3lb.  There are no changes in her behavior or sleep.  Her mood is good.  She has been walking with a walker for several months because of generalized weakness and gait support.  She denies numbness/tingling of the feet or low back pain.    Dementia questions Memory Are you repeating things excessively?  yes Are you forgetting important details of conversations/events?  yes Are you prone to misplacing items more now than in  the past?  yes Can you tell me about some recent headlines?  Executive Are you having trouble managing financial matters?  Yes, son manages finances now Are you taking your medications regularly w/o prompting?  sometimes Can you organize and prepare a large holiday meal for multiple people?  no Can you multitask effectively?  no  Language Do you have any word finding difficulties?  yes Do you have trouble following instructions or a conversation?  yes Have you been avoiding reading or writing due to problems with recognition or remembering words?  no Are you using generalities when speaking because of memory trouble? yes  Visuospatial Are you getting lost while driving?  yes Are you getting turned around in your own home?  no Do you have trouble recognizing familiar faces/family members/close friends? no Do you have trouble dressing, putting on cloths? no   Out-side paper records, electronic medical record, and images have been reviewed where available and summarized as:  MRI brain 12/29/2015: 1.  No acute intracranial abnormality. 2. Disproportionate atrophy of both anterior temporal lobes suspected. 3. Mild for age signal changes probably due to chronic small vessel Disease.  Labs 12/02/2015:  RPR neg, HIV neg, TSH 0.71, vitamin B12 623   Past Medical History:  Diagnosis Date  . Allergy   . Anemia   . Diverticulosis   . Hip fracture, left ([place]) 06/26/2012  . Hypertension   . HYPERTENSION 12/17/2006   no rx, former  . Internal hemorrhoids   . Nail  fungus   . Osteoporosis   . Osteoporosis with fracture   . Rash   . TOTAL KNEE REPLACEMENT, RIGHT, HX OF 06/10/2007   r    Past Surgical History:  Procedure Laterality Date  . CATARACT EXTRACTION Bilateral   . HIP ARTHROPLASTY Left 06/28/2012   Procedure: Left HIP HEMI ARTHROPLASTY;  Surgeon: Mauri Pole, MD;  Location: [facility];  Service: Orthopedics;  Laterality: Left;  . REPLACEMENT TOTAL KNEE Right   . TONSILLECTOMY         Medications:  Outpatient Encounter Prescriptions as of 01/26/2016  Medication Sig Note  . acetaminophen (TYLENOL) 650 MG CR tablet Take 650 mg by mouth every 8 (eight) hours as needed for pain. Reported on 09/14/2015   . calcium-vitamin D (OSCAL WITH D 500-200) 500-200 MG-UNIT per tablet Take 1 tablet by mouth 2 (two) times daily.     . feeding supplement (BOOST HIGH PROTEIN) LIQD Take 1 Container by mouth 2 (two) times daily between meals.   . ferrous sulfate 325 (65 FE) MG tablet Take 1 tablet (325 mg total) by mouth daily with breakfast.   . fluticasone (FLONASE) 50 MCG/ACT nasal spray Place 2 sprays into the nose daily as needed.   . loratadine (CLARITIN) 10 MG tablet  01/26/2016: Received from: External Pharmacy  . Multiple Vitamin (MULTIVITAMIN WITH MINERALS) TABS Take 1 tablet by mouth daily.   . polyethylene glycol (MIRALAX / GLYCOLAX) packet Take 17 g by mouth daily as needed.   . pyridoxine (B-6) 50 MG tablet Take 50 mg by mouth daily.     . vitamin B-12 (CYANOCOBALAMIN) 250 MCG tablet Take 250 mcg by mouth daily.     Marland Kitchen zinc gluconate 50 MG tablet Take 50 mg by mouth daily.      No facility-administered encounter medications on file as of 01/26/2016.      Allergies:  Allergies  Allergen Reactions  . Fish Allergy Rash    ALL SEAFOOD  . Pneumococcal Vaccine Polyvalent   . Ramipril     REACTION: rash    Family History: Family History  Problem Relation Age of Onset  . Heart disease Mother     Social History: Social History  Substance Use Topics  . Smoking status: Never Smoker  . Smokeless tobacco: Never Used  . Alcohol use No   Social History   Social History Narrative   Lives alone. Son lives next door.    Widowed. 2 sons (both in are), Step grandchildren      Retired from [employer]       Hobbies: some bus trips with friends, time with friends    Review of Systems:  CONSTITUTIONAL: No fevers, chills, night sweats, +weight loss.   EYES: No  visual changes or eye pain ENT: No hearing changes.  No history of nose bleeds.   RESPIRATORY: No cough, wheezing and shortness of breath.   CARDIOVASCULAR: Negative for chest pain, and palpitations.   GI: Negative for abdominal discomfort, blood in stools or black stools.  No recent change in bowel habits.   GU:  No history of incontinence.   MUSCLOSKELETAL: No history of joint pain or swelling.  No myalgias.   SKIN: Negative for lesions, rash, and itching.   HEMATOLOGY/ONCOLOGY: Negative for prolonged bleeding, bruising easily, and swollen nodes.  No history of cancer.   ENDOCRINE: Negative for cold or heat intolerance, polydipsia or goiter.   PSYCH:  No depression or anxiety symptoms.   NEURO: As Above.  Vital Signs:  BP 108/80   Pulse 60   Ht 5\' 3"  (1.6 m)   Wt 96 lb (43.5 kg)   SpO2 98%   BMI 17.01 kg/m    General Medical Exam:   General:  Well dressed and appearing, comfortable.    Neurological Exam: MENTAL STATUS including orientation to time, place, person, recent and remote memory, attention span and concentration, language, and fund of knowledge is fair.  She does not recall the names of the current or previous presents, but can name earlier ones "[place], [place]".  She initially states the year is 1990-something, but then says "it's 2000 and something".   If the home was on fire, she would call someone, but does not say what number.  If she developed chest pain, she would take aspirin but not call anyone.  Speech is not dysarthric.  Montreal Cognitive Assessment  01/26/2016  Visuospatial/ Executive (0/5) 1  Naming (0/3) 1  Attention: Read list of digits (0/2) 2  Attention: Read list of letters (0/1) 1  Attention: Serial 7 subtraction starting at 100 (0/3) 3  Language: Repeat phrase (0/2) 2  Language : Fluency (0/1) 0  Abstraction (0/2) 0  Delayed Recall (0/5) 0  Orientation (0/6) 2  Total 12  Adjusted Score (based on education) 13    CRANIAL NERVES: II:  No  visual field defects.  Unremarkable fundi.   III-IV-VI: Pupils equal round and reactive to light.  Normal conjugate, extra-ocular eye movements in all directions of gaze.  No nystagmus.  No ptosis.   V:  Normal facial sensation.     VII:  Normal facial symmetry and movements.  Snout and Myerson's is present.  VIII:  Normal hearing and vestibular function.   IX-X:  Normal palatal movement.   XI:  Normal shoulder shrug and head rotation.   XII:  Normal tongue strength and range of motion, no deviation or fasciculation.  MOTOR:  No atrophy, fasciculations or abnormal movements.  No pronator drift.  Tone is normal.    Right Upper Extremity:    Left Upper Extremity:    Deltoid  5/5   Deltoid  5/5   Biceps  5/5   Biceps  5/5   Triceps  5/5   Triceps  5/5   Wrist extensors  5/5   Wrist extensors  5/5   Wrist flexors  5/5   Wrist flexors  5/5   Finger extensors  5/5   Finger extensors  5/5   Finger flexors  5/5   Finger flexors  5/5   Dorsal interossei  5/5   Dorsal interossei  5/5   Abductor pollicis  5/5   Abductor pollicis  5/5   Tone (Ashworth scale)  0  Tone (Ashworth scale)  0   Right Lower Extremity:    Left Lower Extremity:    Hip flexors  5/5   Hip flexors  5/5   Hip extensors  5/5   Hip extensors  5/5   Knee flexors  5/5   Knee flexors  5/5   Knee extensors  5/5   Knee extensors  5/5   Dorsiflexors  5/5   Dorsiflexors  5/5   Plantarflexors  5/5   Plantarflexors  5/5   Toe extensors  5/5   Toe extensors  5/5   Toe flexors  5/5   Toe flexors  5/5   Tone (Ashworth scale)  0  Tone (Ashworth scale)  0   MSRs:  Right  Left brachioradialis 2+  brachioradialis 2+  biceps 2+  biceps 2+  triceps 2+  triceps 2+  patellar 2+  patellar 2+  ankle jerk 0  ankle jerk 0  Hoffman no  Hoffman no  plantar response down  plantar response down   SENSORY:  Normal and symmetric perception of light touch, pinprick, vibration, and  proprioception.  Romberg's sign absent.   COORDINATION/GAIT: Normal finger-to- nose-finger.  Finger tapping shows reduced amplitude, but rate is intact.  She is unable to rise from a chair without using arms.  Gait narrow based, mildly stooped, and overall stable unassisted.  She walks faster and with more stability with a walker.    IMPRESSION: Ms. Ferns is a pleasant 82 year-old female referred for evaluation of progressive memory changes since 2015.  She was previously independent managing all her executive functions, but over the past 2 years, there has been a steady decline in cognitive functioning which has slowly became to interfere with her instrumental activities of daily living.  She is able to perform all basic ADLs, but her son, Remo Lipps, has taken the role to manage her finances, supplements, shopping, cooking, as well as other household chores.  Today's cognitive testing shows deficits in all domains, especially executive/visuospatial and delayed recall/orientation.  I have reviewed her MRI brain from August 2017 which showed marked atrophy of the temporal and parietal regions.  She has already had laboratory testing looking for treatable causes of memory loss which returned normal.  Based on her history, exam, and MRI findings, she most likely has Alzheimer's dementia in the moderate to severe range.    We had an extensive discussion regarding the diagnoses, management options, and prognosis.  They do not wish to start any acetylcholinesterase inhibitors.  I would recommend that with the progressive nature of the disease, 24-hour in home supervision is recommended for safety or alternative options outside the home were discussed, or at the least, a medical alert system since she is alone at night.  I have also provided the patient and family with community resources for senior care and information on setting up POA.  They are already working with an attorney for this.   Her neurological exam  otherwise, does not show focal weakness or sensory deficits.  Her generalized weakness is most likely stemming for malnutrition and deconditioning and she was encouraged to drink at least 2-3 ensure/boost drinks daily. I recommend that she try to engage in mentally stimulating activities such as puzzles, coloring books, or sodoku as well as walk daily, as able.   Return to clinic as needed    The duration of this appointment visit was 50 minutes of face-to-face time with the patient.  Greater than 50% of this time was spent in counseling, explanation of diagnosis, planning of further management, and coordination of care.   Thank you for allowing me to participate in patient's care.  If I can answer any additional questions, I would be pleased to do so.    Sincerely,    Donika K. Posey Pronto, DO

## 2016-02-01 ENCOUNTER — Ambulatory Visit (INDEPENDENT_AMBULATORY_CARE_PROVIDER_SITE_OTHER): Payer: Medicare Other | Admitting: Family Medicine

## 2016-02-01 ENCOUNTER — Encounter: Payer: Self-pay | Admitting: Family Medicine

## 2016-02-01 VITALS — BP 110/70 | HR 91 | Temp 98.8°F | Wt 95.2 lb

## 2016-02-01 DIAGNOSIS — R627 Adult failure to thrive: Secondary | ICD-10-CM | POA: Diagnosis not present

## 2016-02-01 DIAGNOSIS — E46 Unspecified protein-calorie malnutrition: Secondary | ICD-10-CM

## 2016-02-01 DIAGNOSIS — F0390 Unspecified dementia without behavioral disturbance: Secondary | ICD-10-CM

## 2016-02-01 DIAGNOSIS — Z23 Encounter for immunization: Secondary | ICD-10-CM

## 2016-02-01 NOTE — Patient Instructions (Signed)
Will place referral to palliative care- they will come out to home to evaluate you. I would ask them for their assistance with getting placed into nursing home since that seems like ultimate path. You may want to go ahead and begin looking at facilities locally though.   Flu shot high dose before you leave

## 2016-02-01 NOTE — Progress Notes (Signed)
Pre visit review using our clinic review tool, if applicable. No additional management support is needed unless otherwise documented below in the visit note. 

## 2016-02-01 NOTE — Progress Notes (Signed)
Subjective:  Diane Little is a 82 y.o. year old very pleasant female patient who presents for/with See problem oriented charting ROS- admits to memory loss, no chest pain, chest pain. No abdominal pain. Low appetitie.see any ROS included in HPI as well.   Past Medical History-  Patient Active Problem List   Diagnosis Date Noted  . Alzheimer's dementia without behavioral disturbance 07/13/2014    Priority: High  . Protein-calorie malnutrition ([place]) 09/08/2013    Priority: Medium  . Overactive bladder 02/28/2012    Priority: Medium  . Iron deficiency anemia 12/17/2006    Priority: Medium  . Osteoporosis 12/17/2006    Priority: Medium  . Aortic sclerosis ([place]) 05/27/2014    Priority: Low  . Total knee replacement status 06/10/2007    Priority: Low  . Allergic rhinitis 12/17/2006    Priority: Low    Medications- reviewed and updated Current Outpatient Prescriptions  Medication Sig Dispense Refill  . acetaminophen (TYLENOL) 650 MG CR tablet Take 650 mg by mouth every 8 (eight) hours as needed for pain. Reported on 09/14/2015    . calcium-vitamin D (OSCAL WITH D 500-200) 500-200 MG-UNIT per tablet Take 1 tablet by mouth 2 (two) times daily.      . feeding supplement (BOOST HIGH PROTEIN) LIQD Take 1 Container by mouth 2 (two) times daily between meals.    . ferrous sulfate 325 (65 FE) MG tablet Take 1 tablet (325 mg total) by mouth daily with breakfast. 30 tablet 1  . fluticasone (FLONASE) 50 MCG/ACT nasal spray Place 2 sprays into the nose daily as needed. 16 g 5  . loratadine (CLARITIN) 10 MG tablet   3  . Multiple Vitamin (MULTIVITAMIN WITH MINERALS) TABS Take 1 tablet by mouth daily.    . polyethylene glycol (MIRALAX / GLYCOLAX) packet Take 17 g by mouth daily as needed. 14 each 0  . pyridoxine (B-6) 50 MG tablet Take 50 mg by mouth daily.      . vitamin B-12 (CYANOCOBALAMIN) 250 MCG tablet Take 250 mcg by mouth daily.      Marland Kitchen zinc gluconate 50 MG tablet Take 50 mg by mouth  daily.       No current facility-administered medications for this visit.     Objective: BP 110/70 (BP Location: Left Arm, Patient Position: Sitting, Cuff Size: Normal)   Pulse 91   Temp 98.8 F (37.1 C) (Oral)   Wt 95 lb 3.2 oz (43.2 kg)   SpO2 96%   BMI 16.86 kg/m  Gen: NAD, very thin, muscle wasting at temples and below eyes CV: RRR no murmurs rubs or gallops Lungs: CTAB no crackles, wheeze, rhonchi Abdomen: soft/nontender/nondistended/normal bowel sounds.  Ext: no edema Skin: warm, dry, no rash but did not repeat sacrum examination Neuro: hard of hearing, repetitive  Assessment/Plan:  FTT Protein calorie malnutrition Alzheimers dementia S: Patient seen about 6 weeks ago for protein calorie malnutrition/failure to thrive/dementia. Patient has had substantial decline over last 6 months (gradual over 2 years) and is having trouble caring for herself. Even when meals are prepared for her she may barely eat. She is at least doing 3 boost a day. Very repetitive in words. Does her own ADLs but son manages finances and does shopping.  Her strength was worse when down to 93 lbs but at least has rebounded some. Sons able to be with her about 75% of time but not full time. She is trying to get medicaid to get placement in SNF likely- sons ask  about FL2 and we discussed would fill this out when they had facility selected but if medicaid needs this we could fill this out sooner. Strength improved some with HH PT and OT. Nursing and PT/OT visits have now ended though. Her memory loss has stabilized. See note on 01/26/16 from Dr. Posey Pronto- only prn follow up planned and opted not to start her on medications such as aricept.   Weigh down from 107 (BMI 17/81 in January 2016) to now 95.2 (16.86 BMI), her clothes are loose and baggy.  A/P: I am worried that patient may be nearing point where hospice should be involved but am not certain. We opted instead to pursue palliative care consult at this time and  see what their opinion is- obviously if they recommend hospice we could proceed down that right. Will likely need social work services to help with placement and becoming eligible for medicaid which she has been struggling with. Told sons I am happy to complete FL2 when facility needs it but they have not selected yet. Referral shouldbe called into son Dossie Konecny [phone] -continue boost BID to TID - continue as close to as possible around the clock supervision - continue to prepare meals and present to patient.   Return in about 2 months (around 04/02/2016).  Orders Placed This Encounter  Procedures  . Flu vaccine HIGH DOSE PF   Return precautions advised.  Garret Reddish, MD

## 2016-02-04 ENCOUNTER — Telehealth: Payer: Self-pay | Admitting: Family Medicine

## 2016-02-04 MED ORDER — MIRTAZAPINE 7.5 MG PO TABS: 7.5000 mg | ORAL_TABLET | Freq: Every day | ORAL | 5 refills | Status: DC

## 2016-02-04 NOTE — Telephone Encounter (Signed)
Spoke to Richardson Landry pt's son, told him Rx for Remeron was sent to pharmacy for pt. Richardson Landry verbalized understanding.

## 2016-02-04 NOTE — Telephone Encounter (Signed)
[facility] very reasonable choice- I sent this in for patient. Thrilled to have palliative care in board.  Son Richardson Landry Silberman. [phone] [phone]- let him know about new rx

## 2016-02-04 NOTE — Telephone Encounter (Signed)
Pt  Has dementia and failure to thrive. NP said pt is not eligible for hospice due to she can feed herself etc pt is eligible for palliative care. NP would like MD to prescribe remeron 7.5 mg #30 w/refills [facility]

## 2016-08-30 DIAGNOSIS — R413 Other amnesia: Secondary | ICD-10-CM | POA: Diagnosis not present

## 2016-09-21 ENCOUNTER — Ambulatory Visit (INDEPENDENT_AMBULATORY_CARE_PROVIDER_SITE_OTHER): Payer: Medicare Other | Admitting: Family Medicine

## 2016-09-21 ENCOUNTER — Encounter: Payer: Self-pay | Admitting: Family Medicine

## 2016-09-21 VITALS — BP 122/78 | HR 93 | Temp 98.2°F | Ht 63.0 in | Wt 118.8 lb

## 2016-09-21 DIAGNOSIS — D509 Iron deficiency anemia, unspecified: Secondary | ICD-10-CM | POA: Diagnosis not present

## 2016-09-21 DIAGNOSIS — E440 Moderate protein-calorie malnutrition: Secondary | ICD-10-CM | POA: Diagnosis not present

## 2016-09-21 LAB — CBC WITH DIFFERENTIAL/PLATELET
BASOS PCT: 1.4 % (ref 0.0–3.0)
Basophils Absolute: 0.1 10*3/uL (ref 0.0–0.1)
EOS ABS: 0.1 10*3/uL (ref 0.0–0.7)
Eosinophils Relative: 2.8 % (ref 0.0–5.0)
HCT: 35 % — ABNORMAL LOW (ref 36.0–46.0)
Hemoglobin: 11.9 g/dL — ABNORMAL LOW (ref 12.0–15.0)
LYMPHS ABS: 0.7 10*3/uL (ref 0.7–4.0)
Lymphocytes Relative: 14.2 % (ref 12.0–46.0)
MCHC: 34 g/dL (ref 30.0–36.0)
MCV: 94.3 fl (ref 78.0–100.0)
MONO ABS: 0.6 10*3/uL (ref 0.1–1.0)
Monocytes Relative: 11.2 % (ref 3.0–12.0)
NEUTROS ABS: 3.6 10*3/uL (ref 1.4–7.7)
Neutrophils Relative %: 70.4 % (ref 43.0–77.0)
PLATELETS: 174 10*3/uL (ref 150.0–400.0)
RBC: 3.71 Mil/uL — ABNORMAL LOW (ref 3.87–5.11)
RDW: 13.3 % (ref 11.5–15.5)
WBC: 5.1 10*3/uL (ref 4.0–10.5)

## 2016-09-21 LAB — COMPREHENSIVE METABOLIC PANEL
ALT: 13 U/L (ref 0–35)
AST: 18 U/L (ref 0–37)
Albumin: 4 g/dL (ref 3.5–5.2)
Alkaline Phosphatase: 54 U/L (ref 39–117)
BILIRUBIN TOTAL: 0.5 mg/dL (ref 0.2–1.2)
BUN: 21 mg/dL (ref 6–23)
CHLORIDE: 105 meq/L (ref 96–112)
CO2: 29 meq/L (ref 19–32)
CREATININE: 0.9 mg/dL (ref 0.40–1.20)
Calcium: 9.6 mg/dL (ref 8.4–10.5)
GFR: 62.58 mL/min (ref >60.00)
Glucose, Bld: 122 mg/dL — ABNORMAL HIGH (ref 70–99)
Potassium: 4.1 mEq/L (ref 3.5–5.1)
SODIUM: 142 meq/L (ref 135–145)
Total Protein: 6.8 g/dL (ref 6.0–8.3)

## 2016-09-21 LAB — FERRITIN: FERRITIN: 124.7 ng/mL (ref 10.0–291.0)

## 2016-09-21 NOTE — Progress Notes (Signed)
Subjective:  Diane Little is a 83 y.o. year old very pleasant female patient who presents for/with See problem oriented charting ROS- No chest pain or shortness of breath. No headache or blurry vision. Has had weight gain   Past Medical History-  Patient Active Problem List   Diagnosis Date Noted  . Alzheimer's dementia without behavioral disturbance 07/13/2014    Priority: High  . Aortic sclerosis 05/27/2014    Priority: Medium  . Protein-calorie malnutrition ([place]) 09/08/2013    Priority: Medium  . Iron deficiency anemia 12/17/2006    Priority: Medium  . Osteoporosis 12/17/2006    Priority: Medium  . Overactive bladder 02/28/2012    Priority: Low  . Total knee replacement status 06/10/2007    Priority: Low  . Allergic rhinitis 12/17/2006    Priority: Low    Medications- reviewed and updated Current Outpatient Prescriptions  Medication Sig Dispense Refill  . calcium-vitamin D (OSCAL WITH D 500-200) 500-200 MG-UNIT per tablet Take 1 tablet by mouth daily with breakfast.     . ferrous sulfate 325 (65 FE) MG tablet Take 1 tablet (325 mg total) by mouth daily with breakfast. 30 tablet 1  . fluticasone (FLONASE) 50 MCG/ACT nasal spray Place 2 sprays into the nose daily as needed. 16 g 5  . Multiple Vitamin (MULTIVITAMIN WITH MINERALS) TABS Take 1 tablet by mouth daily.    Marland Kitchen pyridoxine (B-6) 50 MG tablet Take 50 mg by mouth daily.      . vitamin B-12 (CYANOCOBALAMIN) 250 MCG tablet Take 250 mcg by mouth daily.      Marland Kitchen zinc gluconate 50 MG tablet Take 50 mg by mouth daily.       No current facility-administered medications for this visit.     Objective: BP 122/78 (BP Location: Left Arm, Patient Position: Sitting, Cuff Size: Normal)   Pulse 93   Temp 98.2 F (36.8 C) (Oral)   Ht 5\' 3"  (1.6 m)   Wt 118 lb 12.8 oz (53.9 kg)   SpO2 96%   BMI 21.04 kg/m  Gen: NAD, resting comfortably, cheeks more full- clothes not falling off of her anymore CV: RRR. Stable murmur from  aortic sclerosis. no rubs or gallops Lungs: CTAB no crackles, wheeze, rhonchi Abdomen: soft/nontender/nondistended/normal bowel sounds. thin Ext: no edema Skin: warm, dry  Assessment/Plan:  Iron deficiency anemia, unspecified iron deficiency anemia type - Plan: CBC with Differential/Platelet, Ferritin  Overactive bladder  Moderate protein-calorie malnutrition (HCC) - Plan: Comprehensive metabolic panel, CBC with Differential/Platelet  Protein-calorie malnutrition S: Patient has really thrived with having almost full time supervision and meal preparation. Never added remeron-This was advised after palliative consult. She has really thrived- eating much better. The main change is family being there to prepare meals for herSocial worker still coming out to home. Doing boost TID.  Wt Readings from Last 3 Encounters:  09/21/16 118 lb 12.8 oz (53.9 kg)  02/01/16 95 lb 3.2 oz (43.2 kg)  01/26/16 96 lb (43.5 kg)  A/P: advised 6 month follow up- but thrilled she is doing so well. Agreed if she continues to do so well could stretch up to 9 months. Will get cbc, cmp. Will see susan in the fall for AWV  Iron deficiency anemia S: has been stable around 10- does not want to undergo extensive workup. Will review this decision again with patient/family next visit A/P: update cbc, ferritin- continue iron (unclear source of deficiency to this point)  6-9 months but awv in fall with RN Manuela Schwartz  Orders Placed This Encounter  Procedures  . Comprehensive metabolic panel    [facility]  . CBC with Differential/Platelet  . Ferritin   Return precautions advised.  Garret Reddish, MD

## 2016-09-21 NOTE — Assessment & Plan Note (Signed)
S: has been stable around 10- does not want to undergo extensive workup. Will review this decision again with patient/family next visit A/P: update cbc, ferritin- continue iron (unclear source of deficiency to this point)

## 2016-09-21 NOTE — Patient Instructions (Signed)
Please stop by lab before you go  I would also like for you to sign up for an annual wellness visit with our nurse, Manuela Schwartz, who specializes in the annual wellness exam. This is a free benefit under medicare that may help [place] find additional ways to help you. Some highlights are reviewing medications, lifestyle, and doing a dementia screen.

## 2016-09-21 NOTE — Assessment & Plan Note (Signed)
S: Patient has really thrived with having almost full time supervision and meal preparation. Never added remeron-This was advised after palliative consult. She has really thrived- eating much better. The main change is family being there to prepare meals for herSocial worker still coming out to home. Doing boost TID.  Wt Readings from Last 3 Encounters:  09/21/16 118 lb 12.8 oz (53.9 kg)  02/01/16 95 lb 3.2 oz (43.2 kg)  01/26/16 96 lb (43.5 kg)  A/P: advised 6 month follow up- but thrilled she is doing so well. Agreed if she continues to do so well could stretch up to 9 months. Will get cbc, cmp. Will see susan in the fall for AWV

## 2016-10-31 ENCOUNTER — Telehealth: Payer: Self-pay | Admitting: Family Medicine

## 2016-10-31 NOTE — Telephone Encounter (Signed)
Pts son is calling to see if pt could get a referral to a podiatrist to get her toe nails cut.

## 2016-11-03 ENCOUNTER — Other Ambulatory Visit: Payer: Self-pay

## 2016-11-03 DIAGNOSIS — L602 Onychogryphosis: Secondary | ICD-10-CM

## 2016-11-03 NOTE — Telephone Encounter (Signed)
Referral placed as requested.

## 2016-11-20 ENCOUNTER — Ambulatory Visit (INDEPENDENT_AMBULATORY_CARE_PROVIDER_SITE_OTHER): Payer: Medicare Other | Admitting: Family Medicine

## 2016-11-20 ENCOUNTER — Encounter: Payer: Self-pay | Admitting: Family Medicine

## 2016-11-20 VITALS — BP 130/78 | HR 90 | Temp 98.5°F | Ht 63.0 in | Wt 122.2 lb

## 2016-11-20 DIAGNOSIS — M3390 Dermatopolymyositis, unspecified, organ involvement unspecified: Secondary | ICD-10-CM | POA: Diagnosis not present

## 2016-11-20 DIAGNOSIS — R21 Rash and other nonspecific skin eruption: Secondary | ICD-10-CM | POA: Diagnosis not present

## 2016-11-20 MED ORDER — TRIAMCINOLONE ACETONIDE 0.1 % EX CREA: 1.0000 "application " | TOPICAL_CREAM | Freq: Two times a day (BID) | CUTANEOUS | 0 refills | Status: DC

## 2016-11-20 MED ORDER — METHYLPREDNISOLONE ACETATE 40 MG/ML IJ SUSP
40.0000 mg | Freq: Once | INTRAMUSCULAR | Status: AC
  Administered 2016-11-20: 40 mg via INTRAMUSCULAR

## 2016-11-20 NOTE — Progress Notes (Signed)
Subjective:  Diane Little is a 83 y.o. year old very pleasant female patient who presents for/with See problem oriented charting ROS- no fever, chills, chest pain, shortness of breath, new medications, rash involving the mucus membranes,    Past Medical History-  Patient Active Problem List   Diagnosis Date Noted  . Alzheimer's dementia without behavioral disturbance 07/13/2014    Priority: High  . Aortic sclerosis 05/27/2014    Priority: Medium  . Protein-calorie malnutrition ([place]) 09/08/2013    Priority: Medium  . Iron deficiency anemia 12/17/2006    Priority: Medium  . Osteoporosis 12/17/2006    Priority: Medium  . Overactive bladder 02/28/2012    Priority: Low  . Total knee replacement status 06/10/2007    Priority: Low  . Allergic rhinitis 12/17/2006    Priority: Low    Medications- reviewed and updated Current Outpatient Prescriptions  Medication Sig Dispense Refill  . calcium-vitamin D (OSCAL WITH D 500-200) 500-200 MG-UNIT per tablet Take 1 tablet by mouth daily with breakfast.     . ferrous sulfate 325 (65 FE) MG tablet Take 1 tablet (325 mg total) by mouth daily with breakfast. 30 tablet 1  . fluticasone (FLONASE) 50 MCG/ACT nasal spray Place 2 sprays into the nose daily as needed. 16 g 5  . Multiple Vitamin (MULTIVITAMIN WITH MINERALS) TABS Take 1 tablet by mouth daily.    Marland Kitchen pyridoxine (B-6) 50 MG tablet Take 50 mg by mouth daily.      . vitamin B-12 (CYANOCOBALAMIN) 250 MCG tablet Take 250 mcg by mouth daily.      Marland Kitchen zinc gluconate 50 MG tablet Take 50 mg by mouth daily.       No current facility-administered medications for this visit.     Objective: BP 130/78 (BP Location: Left Arm, Patient Position: Sitting, Cuff Size: Normal)   Pulse 90   Temp 98.5 F (36.9 C) (Oral)   Ht 5\' 3"  (1.6 m)   Wt 122 lb 3.2 oz (55.4 kg)   SpO2 96%   BMI 21.65 kg/m  Gen: NAD, resting comfortably CV: RRR no murmurs rubs or gallops Lungs: CTAB no crackles, wheeze,  rhonchi Ext: no edema Skin: warm, dry, erythema and slight edema around both eyes. Extensive erythema on neck without warmth- several peeling areas (reported to be thickly covered calamine by son). Also some erythema and redness on hand near the thumb Neuro: no apparent weakness increase from baseline.        Assessment/Plan:  Rash due to Dermatomyositis ? S: Patient started with very pruritic rash on her neck Last Thursday or Friday which seems to be expanding some. Also in last day or two slight redness around her eyes. Has noted some redness on right hand as well.   They have tried calamine lotion and lathered that on and appears to have produced some crusting/scaling. No reported change in her baseline behavior.   A/P: Initially I was concerned about possible infection but there is no significant warmth and not rapidly expanding. Then thought about contact dermatitis such as a necklace or some new lotion or material she had been using but none was reported. We opted to trial course of depomedrol 40mg  IM today as a result. If no significant response had considered dermatology referral. Also could trial triamcinolone on neck and stop calamine on rash.   After visit, thinking about the distribution of rash (possible shawl sign and heliotrope rash though I doubt rash on hands indicates Gottron's papules) I became concerned  about dermatomyositis. I had thought the peeling area was calamine but may reflect hperkeratosis and peeling. We will need to have patient back for labs tomorrow to further evaluate. Also went ahead and put dermatology referral in- I would likely also place her on prednisone depending on lab findings. At her age- would think this would point away from diagnosis as well over 41.   Depending on lab findings- also likely to get x-ray  Orders Placed This Encounter  Procedures  . CBC with Differential/Platelet    Standing Status:   Future    Standing Expiration Date:    11/21/2018  . Comprehensive metabolic panel    [place]    Standing Status:   Future    Standing Expiration Date:   11/21/2018  . Sedimentation rate    [place]    Standing Status:   Future    Standing Expiration Date:   11/21/2018  . C-reactive Protein    Standing Status:   Future    Standing Expiration Date:   11/21/2018  . CK    Standing Status:   Future    Standing Expiration Date:   11/21/2018  . Aldolase    Standing Status:   Future    Standing Expiration Date:   11/21/2018  . ANA    Standing Status:   Future    Standing Expiration Date:   11/21/2018  . Sjogrens syndrome-A extractable nuclear antibody    Standing Status:   Future    Standing Expiration Date:   11/21/2018  . Sjogrens syndrome-B extractable nuclear antibody    Standing Status:   Future    Standing Expiration Date:   11/21/2018  . Anti-ribonucleic acid antibody    Standing Status:   Future    Standing Expiration Date:   11/21/2018  . Anti-Smith antibody    Standing Status:   Future    Standing Expiration Date:   11/21/2018  . Jo-1 antibody-IgG    Standing Status:   Future    Standing Expiration Date:   11/21/2018  . Ambulatory referral to Rheumatology    Referral Priority:   Routine    Referral Type:   Consultation    Referral Reason:   Specialty Services Required    Requested Specialty:   Rheumatology    Number of Visits Requested:   1    Meds ordered this encounter  Medications  . triamcinolone cream (KENALOG) 0.1 %    Sig: Apply 1 application topically 2 (two) times daily. For 7-10 days maximum    Dispense:  80 g    Refill:  0  . methylPREDNISolone acetate (DEPO-MEDROL) injection 40 mg    Return precautions advised.  Garret Reddish, MD

## 2016-11-20 NOTE — Patient Instructions (Addendum)
Tonight do a good wash off of all the old calamine- trying to remove everything you can. Then just leave it dry overnight  Use scent free   Tomorrow morning can start triamcinolone cream on the neck and hand- do not use around the eyes  If not making progress by Thursday or Friday - let me know- may also use a course of prednisone and refer to dermatology  Use basic unscented soap- may want to soak for a bit to help remove scale from calamine. No changes recently. Avoid any necklaces

## 2016-11-21 ENCOUNTER — Telehealth: Payer: Self-pay

## 2016-11-21 ENCOUNTER — Other Ambulatory Visit (INDEPENDENT_AMBULATORY_CARE_PROVIDER_SITE_OTHER): Payer: Medicare Other

## 2016-11-21 DIAGNOSIS — M3390 Dermatopolymyositis, unspecified, organ involvement unspecified: Secondary | ICD-10-CM

## 2016-11-21 DIAGNOSIS — R21 Rash and other nonspecific skin eruption: Secondary | ICD-10-CM

## 2016-11-21 LAB — COMPREHENSIVE METABOLIC PANEL
ALBUMIN: 3.8 g/dL (ref 3.5–5.2)
ALK PHOS: 51 U/L (ref 39–117)
ALT: 17 U/L (ref 0–35)
AST: 18 U/L (ref 0–37)
BILIRUBIN TOTAL: 0.5 mg/dL (ref 0.2–1.2)
BUN: 31 mg/dL — ABNORMAL HIGH (ref 6–23)
CO2: 29 mEq/L (ref 19–32)
Calcium: 9.2 mg/dL (ref 8.4–10.5)
Chloride: 105 mEq/L (ref 96–112)
Creatinine, Ser: 0.88 mg/dL (ref 0.40–1.20)
GFR: 64.2 mL/min (ref >60.00)
GLUCOSE: 148 mg/dL — AB (ref 70–99)
Potassium: 4.1 mEq/L (ref 3.5–5.1)
Sodium: 141 mEq/L (ref 135–145)
TOTAL PROTEIN: 6.4 g/dL (ref 6.0–8.3)

## 2016-11-21 LAB — CBC WITH DIFFERENTIAL/PLATELET
BASOS ABS: 0 10*3/uL (ref 0.0–0.1)
Basophils Relative: 0.6 % (ref 0.0–3.0)
EOS PCT: 2.8 % (ref 0.0–5.0)
Eosinophils Absolute: 0.2 10*3/uL (ref 0.0–0.7)
HEMATOCRIT: 34.8 % — AB (ref 36.0–46.0)
HEMOGLOBIN: 11.9 g/dL — AB (ref 12.0–15.0)
LYMPHS PCT: 10.6 % — AB (ref 12.0–46.0)
Lymphs Abs: 0.8 10*3/uL (ref 0.7–4.0)
MCHC: 34.1 g/dL (ref 30.0–36.0)
MCV: 94.8 fl (ref 78.0–100.0)
MONOS PCT: 7.5 % (ref 3.0–12.0)
Monocytes Absolute: 0.5 10*3/uL (ref 0.1–1.0)
NEUTROS PCT: 78.5 % — AB (ref 43.0–77.0)
Neutro Abs: 5.6 10*3/uL (ref 1.4–7.7)
Platelets: 173 10*3/uL (ref 150.0–400.0)
RBC: 3.67 Mil/uL — ABNORMAL LOW (ref 3.87–5.11)
RDW: 13.5 % (ref 11.5–15.5)
WBC: 7.2 10*3/uL (ref 4.0–10.5)

## 2016-11-21 LAB — C-REACTIVE PROTEIN: CRP: 0.5 mg/dL (ref 0.5–20.0)

## 2016-11-21 LAB — SEDIMENTATION RATE: SED RATE: 19 mm/h (ref 0–30)

## 2016-11-21 LAB — CK: CK TOTAL: 65 U/L (ref 7–177)

## 2016-11-21 NOTE — Telephone Encounter (Signed)
-----   Message from Marin Olp, MD sent at 11/20/2016  8:57 PM EDT ----- Roselyn Reef- I need you to call patient/son tomorrow and have them come back for labs ASAP- I thought of a potential diagnosis called dermatomyositis- inflammatory condition that can lead to weakness. I want some rather extensive labs which I ordered today. Also referred her to rheumatology which I suspect we will keep- as they often manage this. Thanks! Annie Main

## 2016-11-21 NOTE — Telephone Encounter (Signed)
Called and spoke to son who is going to get her brought up here today.

## 2016-11-22 LAB — SJOGRENS SYNDROME-A EXTRACTABLE NUCLEAR ANTIBODY: SSA (RO) (ENA) ANTIBODY, IGG: NEGATIVE

## 2016-11-22 LAB — ANTI-SMITH ANTIBODY: ENA SM AB SER-ACNC: NEGATIVE

## 2016-11-22 LAB — JO-1 ANTIBODY-IGG: JO-1 ANTIBODY, IGG: NEGATIVE

## 2016-11-22 LAB — ANA: Anti Nuclear Antibody(ANA): NEGATIVE

## 2016-11-22 LAB — ANTI-RIBONUCLEIC ACID ANTIBODY: SM/RNP: <1

## 2016-11-22 LAB — SJOGRENS SYNDROME-B EXTRACTABLE NUCLEAR ANTIBODY: SSB (LA) (ENA) ANTIBODY, IGG: NEGATIVE

## 2016-11-23 LAB — ALDOLASE: Aldolase: 4.6 U/L (ref <=8.1)

## 2016-12-04 ENCOUNTER — Encounter: Payer: Self-pay | Admitting: Family Medicine

## 2016-12-06 ENCOUNTER — Ambulatory Visit: Payer: Medicare Other

## 2016-12-08 ENCOUNTER — Encounter: Payer: Self-pay | Admitting: Podiatry

## 2016-12-08 ENCOUNTER — Ambulatory Visit (INDEPENDENT_AMBULATORY_CARE_PROVIDER_SITE_OTHER): Payer: Medicare Other | Admitting: Podiatry

## 2016-12-08 ENCOUNTER — Ambulatory Visit: Payer: Medicare Other | Admitting: Podiatry

## 2016-12-08 DIAGNOSIS — M79674 Pain in right toe(s): Secondary | ICD-10-CM

## 2016-12-08 DIAGNOSIS — B351 Tinea unguium: Secondary | ICD-10-CM

## 2016-12-08 DIAGNOSIS — M79675 Pain in left toe(s): Secondary | ICD-10-CM | POA: Diagnosis not present

## 2016-12-08 NOTE — Progress Notes (Signed)
   Subjective:    Patient ID: Diane Little, female    DOB: 1924/05/12, 83 y.o.   MRN: [id number]  HPI Diane Little female with history of Alzheimer's presents the office today for concerns of thick, painful, elongated toenails that she cannot trim herself. Denies a drainage or swelling. The nails are very elongated and has difficulty wearing shoes.   Review of Systems  Musculoskeletal: Positive for arthralgias and gait problem.  Allergic/Immunologic: Positive for food allergies.  All other systems reviewed and are negative.      Objective:   Physical Exam General: NAD  Dermatological: Nails are hypertrophic, dystrophic, brittle, discolored, very elongated 10. No surrounding redness or drainage. Tenderness nails 1-5 bilaterally. No open lesions or pre-ulcerative lesions are identified today.  Vascular: Dorsalis Pedis artery and Posterior Tibial artery pedal pulses are palpable bilateral with immedate capillary fill time. There is no pain with calf compression, swelling, warmth, erythema.   Neruologic: Grossly intact via light touch bilateral. Patellar and Achilles deep tendon reflexes 2+ bilateral. No Babinski or clonus noted bilateral.   Musculoskeletal: No gross boney pedal deformities bilateral. No pain, crepitus, or limitation noted with foot and ankle range of motion bilateral.   Gait: Uses a walker    Assessment & Plan:  83 year old female with symptomatic onychomycosis, elongated toenails -Treatment options discussed including all alternatives, risks, and complications -Etiology of symptoms were discussed -Nails debrided 10 without complications or bleeding. -Daily foot inspection -Follow-up in 3 months or sooner if any problems arise. In the meantime, encouraged to call the office with any questions, concerns, change in symptoms.   Celesta Gentile, DPM

## 2016-12-27 ENCOUNTER — Other Ambulatory Visit: Payer: Self-pay | Admitting: Family Medicine

## 2016-12-28 NOTE — Telephone Encounter (Signed)
was for her rash. Should not use more for 7-10 days without taking a break. May want to have this reevaluated as has been some time if continues to occur- we could refer her to dermatology for instance.

## 2017-01-04 NOTE — Telephone Encounter (Signed)
Called and left a message for the sons asking for a return phone call to be updated on status

## 2017-01-10 ENCOUNTER — Telehealth: Payer: Self-pay

## 2017-01-10 NOTE — Telephone Encounter (Signed)
Per pharmacy pt is requesting refill on Triamcinolone cream.  Dr. Yong Channel - Please advise. Thanks!

## 2017-01-10 NOTE — Telephone Encounter (Signed)
See refill notes 12/27/16

## 2017-01-26 ENCOUNTER — Ambulatory Visit (INDEPENDENT_AMBULATORY_CARE_PROVIDER_SITE_OTHER): Payer: Medicare Other

## 2017-01-26 DIAGNOSIS — Z23 Encounter for immunization: Secondary | ICD-10-CM | POA: Diagnosis not present

## 2017-01-30 ENCOUNTER — Other Ambulatory Visit: Payer: Self-pay | Admitting: Family Medicine

## 2017-01-31 NOTE — Telephone Encounter (Signed)
Prefer flonase. If symptoms not controlled on flonase- can add this back in.

## 2017-03-06 NOTE — Progress Notes (Signed)
PCP notes:   Health maintenance: NA.   Abnormal screenings: Unable to complete MMS.    Patient concerns: Pts son concerned that pt will not bathe. Gave pt a book about caregiving for patients with memory loss.    Nurse concerns: No.   Next PCP appt: NA.

## 2017-03-06 NOTE — Progress Notes (Signed)
Subjective:   Diane Little is a 83 y.o. female who presents for Medicare Annual (Subsequent) preventive examination.  Review of Systems:  No ROS.  Medicare Wellness Visit. Additional risk factors are reflected in the social history.  Cardiac Risk Factors include: advanced age (>30men, >51 women);sedentary lifestyle     Objective:     Vitals: BP 118/76 (BP Location: Right Arm, Patient Position: Sitting, Cuff Size: Normal)   Pulse 99   Resp 16   Ht 5\' 3"  (1.6 m)   Wt 129 lb 9.6 oz (58.8 kg)   SpO2 96%   BMI 22.96 kg/m   Body mass index is 22.96 kg/m.   Tobacco History  Smoking Status  . Never Smoker  Smokeless Tobacco  . Never Used     Counseling given: Not Answered   Past Medical History:  Diagnosis Date  . Allergy   . Anemia   . Diverticulosis   . Hip fracture, left ([place]) 06/26/2012  . Hypertension   . HYPERTENSION 12/17/2006   no rx, former  . Internal hemorrhoids   . Nail fungus   . Osteoporosis   . Osteoporosis with fracture   . Rash   . TOTAL KNEE REPLACEMENT, RIGHT, HX OF 06/10/2007   r   Past Surgical History:  Procedure Laterality Date  . CATARACT EXTRACTION Bilateral   . HIP ARTHROPLASTY Left 06/28/2012   Procedure: Left HIP HEMI ARTHROPLASTY;  Surgeon: Mauri Pole, MD;  Location: [facility];  Service: Orthopedics;  Laterality: Left;  . REPLACEMENT TOTAL KNEE Right   . TONSILLECTOMY     Family History  Problem Relation Age of Onset  . Heart disease Mother    History  Sexual Activity  . Sexual activity: Not Currently    Outpatient Encounter Prescriptions as of 03/08/2017  Medication Sig  . calcium-vitamin D (OSCAL WITH D 500-200) 500-200 MG-UNIT per tablet Take 1 tablet by mouth daily with breakfast.   . ferrous sulfate 325 (65 FE) MG tablet Take 1 tablet (325 mg total) by mouth daily with breakfast.  . fluticasone (FLONASE) 50 MCG/ACT nasal spray Place 2 sprays into the nose daily as needed.  . Multiple Vitamin (MULTIVITAMIN WITH  MINERALS) TABS Take 1 tablet by mouth daily.  Marland Kitchen pyridoxine (B-6) 50 MG tablet Take 50 mg by mouth daily.    . vitamin B-12 (CYANOCOBALAMIN) 250 MCG tablet Take 250 mcg by mouth daily.    Marland Kitchen zinc gluconate 50 MG tablet Take 50 mg by mouth daily.    . [DISCONTINUED] triamcinolone cream (KENALOG) 0.1 % Apply 1 application topically 2 (two) times daily. For 7-10 days maximum   No facility-administered encounter medications on file as of 03/08/2017.     Activities of Daily Living In your present state of health, do you have any difficulty performing the following activities: 03/08/2017  Hearing? Y  Vision? N  Difficulty concentrating or making decisions? Y  Comment Trouble making decisions about finances and trouble and difficulty with short term memory.  Walking or climbing stairs? Y  Dressing or bathing? Y  Doing errands, shopping? Y  Preparing Food and eating ? N  Using the Toilet? N  In the past six months, have you accidently leaked urine? Y  Do you have problems with loss of bowel control? N  Managing your Medications? Y  Managing your Finances? Y  Housekeeping or managing your Housekeeping? Y  Some recent data might be hidden    Patient Care Team: Marin Olp, MD as  PCP - General (Family Medicine) Gardiner Barefoot, DPM as Consulting Physician (Podiatry)    Assessment:    Physical assessment deferred to PCP.  Exercise Activities and Dietary recommendations Current Exercise Habits: The patient does not participate in regular exercise at present, Exercise limited by: None identified  Goals    . patient          Family in process of long term care planning      Fall Risk Fall Risk  03/08/2017 01/26/2016 11/26/2015 09/14/2015 09/08/2013  Falls in the past year? Yes Yes Yes No No  Number falls in past yr: 2 or more 2 or more - - -  Injury with Fall? No No No - -  Risk Factor Category  - High Fall Risk - - -  Risk for fall due to : Impaired balance/gait Impaired  balance/gait;Impaired mobility - - -  Risk for fall due to: Comment Walks using a walker - - - -  Follow up - Falls evaluation completed;Education provided;Falls prevention discussed - - -   Depression Screen PHQ 2/9 Scores 03/08/2017 11/26/2015 09/14/2015 09/08/2013  PHQ - 2 Score 0 0 0 0     Cognitive Function MMSE - Mini Mental State Exam 03/08/2017 11/26/2015  Not completed: Unable to complete -  Orientation to time - 0  Orientation to Place - 5  Registration - 3  Attention/ Calculation - 5  Recall - 0  Language- name 2 objects - 2  Language- repeat - 1  Language- follow 3 step command - 2  Language- read & follow direction - 1  Write a sentence - 1  Copy design - 0  Total score - 20   Montreal Cognitive Assessment  01/26/2016  Visuospatial/ Executive (0/5) 1  Naming (0/3) 1  Attention: Read list of digits (0/2) 2  Attention: Read list of letters (0/1) 1  Attention: Serial 7 subtraction starting at 100 (0/3) 3  Language: Repeat phrase (0/2) 2  Language : Fluency (0/1) 0  Abstraction (0/2) 0  Delayed Recall (0/5) 0  Orientation (0/6) 2  Total 12  Adjusted Score (based on education) 13      Immunization History  Administered Date(s) Administered  . Influenza Split 02/01/2011, 02/28/2012  . Influenza Whole 03/08/2007, 02/07/2008, 02/02/2009, 01/27/2010  . Influenza, High Dose Seasonal PF 02/24/2013, 02/01/2016, 01/26/2017  . Influenza,inj,Quad PF,6+ Mos 01/09/2014  . Pneumococcal Conjugate-13 09/08/2013  . Pneumococcal Polysaccharide-23 05/08/2001, 08/19/2008  . Td 05/09/2003, 09/08/2013   Screening Tests Health Maintenance  Topic Date Due  . TETANUS/TDAP  09/09/2023  . INFLUENZA VACCINE  Completed  . DEXA SCAN  Addressed  . PNA vac Low Risk Adult  Completed      Plan:   Follow up with PCP as directed.  I have personally reviewed and noted the following in the patient's chart:   . Medical and social history . Use of alcohol, tobacco or illicit drugs   . Current medications and supplements . Functional ability and status . Nutritional status . Physical activity . Advanced directives . List of other physicians . Vitals . Screenings to include cognitive, depression, and falls . Referrals and appointments  In addition, I have reviewed and discussed with patient certain preventive protocols, quality metrics, and best practice recommendations. A written personalized care plan for preventive services as well as general preventive health recommendations were provided to patient.     Williemae Area, RN  03/08/2017

## 2017-03-06 NOTE — Progress Notes (Signed)
Pre visit review using our clinic review tool, if applicable. No additional management support is needed unless otherwise documented below in the visit note. 

## 2017-03-08 ENCOUNTER — Encounter: Payer: Self-pay | Admitting: Family Medicine

## 2017-03-08 ENCOUNTER — Ambulatory Visit (INDEPENDENT_AMBULATORY_CARE_PROVIDER_SITE_OTHER): Payer: Medicare Other | Admitting: Family Medicine

## 2017-03-08 ENCOUNTER — Ambulatory Visit (INDEPENDENT_AMBULATORY_CARE_PROVIDER_SITE_OTHER): Payer: Medicare Other | Admitting: *Deleted

## 2017-03-08 VITALS — BP 118/76 | HR 99 | Temp 98.7°F | Ht 63.0 in | Wt 129.4 lb

## 2017-03-08 VITALS — BP 118/76 | HR 99 | Resp 16 | Ht 63.0 in | Wt 129.6 lb

## 2017-03-08 DIAGNOSIS — G301 Alzheimer's disease with late onset: Secondary | ICD-10-CM | POA: Diagnosis not present

## 2017-03-08 DIAGNOSIS — F0280 Dementia in other diseases classified elsewhere without behavioral disturbance: Secondary | ICD-10-CM

## 2017-03-08 DIAGNOSIS — D509 Iron deficiency anemia, unspecified: Secondary | ICD-10-CM | POA: Diagnosis not present

## 2017-03-08 DIAGNOSIS — E441 Mild protein-calorie malnutrition: Secondary | ICD-10-CM

## 2017-03-08 DIAGNOSIS — Z0000 Encounter for general adult medical examination without abnormal findings: Secondary | ICD-10-CM

## 2017-03-08 LAB — BASIC METABOLIC PANEL
BUN: 17 mg/dL (ref 6–23)
CALCIUM: 9.6 mg/dL (ref 8.4–10.5)
CHLORIDE: 102 meq/L (ref 96–112)
CO2: 34 meq/L — AB (ref 19–32)
CREATININE: 0.81 mg/dL (ref 0.40–1.20)
GFR: 70.59 mL/min (ref >60.00)
GLUCOSE: 93 mg/dL (ref 70–99)
Potassium: 4.3 mEq/L (ref 3.5–5.1)
Sodium: 140 mEq/L (ref 135–145)

## 2017-03-08 LAB — CBC
HEMATOCRIT: 35.1 % — AB (ref 36.0–46.0)
HEMOGLOBIN: 11.8 g/dL — AB (ref 12.0–15.0)
MCHC: 33.6 g/dL (ref 30.0–36.0)
MCV: 96.2 fl (ref 78.0–100.0)
Platelets: 199 10*3/uL (ref 150.0–400.0)
RBC: 3.66 Mil/uL — AB (ref 3.87–5.11)
RDW: 12.7 % (ref 11.5–15.5)
WBC: 6 10*3/uL (ref 4.0–10.5)

## 2017-03-08 NOTE — Assessment & Plan Note (Addendum)
S: protein-calorie malnutrition improving- would now state mild with BMI closing in 23. Weight had been as low as 96.  A/P: much improved- appetite much better. boosts have been helpful. Continue current treatments. With improved nutrition- skin breakdowns have resolved.

## 2017-03-08 NOTE — Assessment & Plan Note (Signed)
S: hgb has been around 10- last few checks actually up near 12- has declined workup and is aware of cancer risks. Last ferritin was ok. On iron once a day A/P: stable hemoglobin today near 12. Ferritin/iron levels pending. Continue regular iron

## 2017-03-08 NOTE — Progress Notes (Signed)
Subjective:  Diane Little is a 83 y.o. year old very pleasant female patient who presents for/with See problem oriented charting ROS- continued memory issues- repetitive, has had weight gain. No chest pain or shortness of breath. Prior rash resolved   Past Medical History-  Patient Active Problem List   Diagnosis Date Noted  . Alzheimer's dementia without behavioral disturbance 07/13/2014    Priority: High  . Aortic sclerosis 05/27/2014    Priority: Medium  . Protein-calorie malnutrition ([place]) 09/08/2013    Priority: Medium  . Iron deficiency anemia 12/17/2006    Priority: Medium  . Osteoporosis 12/17/2006    Priority: Medium  . Overactive bladder 02/28/2012    Priority: Low  . Total knee replacement status 06/10/2007    Priority: Low  . Allergic rhinitis 12/17/2006    Priority: Low    Medications- reviewed and updated Current Outpatient Prescriptions  Medication Sig Dispense Refill  . calcium-vitamin D (OSCAL WITH D 500-200) 500-200 MG-UNIT per tablet Take 1 tablet by mouth daily with breakfast.     . ferrous sulfate 325 (65 FE) MG tablet Take 1 tablet (325 mg total) by mouth daily with breakfast. 30 tablet 1  . fluticasone (FLONASE) 50 MCG/ACT nasal spray Place 2 sprays into the nose daily as needed. 16 g 5  . Multiple Vitamin (MULTIVITAMIN WITH MINERALS) TABS Take 1 tablet by mouth daily.    Marland Kitchen pyridoxine (B-6) 50 MG tablet Take 50 mg by mouth daily.      Marland Kitchen triamcinolone cream (KENALOG) 0.1 % Apply 1 application topically 2 (two) times daily. For 7-10 days maximum 80 g 0  . vitamin B-12 (CYANOCOBALAMIN) 250 MCG tablet Take 250 mcg by mouth daily.      Marland Kitchen zinc gluconate 50 MG tablet Take 50 mg by mouth daily.       No current facility-administered medications for this visit.     Objective: BP 118/76 (BP Location: Left Arm, Patient Position: Sitting, Cuff Size: Normal)   Pulse 99   Temp 98.7 F (37.1 C) (Oral)   Ht 5\' 3"  (1.6 m)   Wt 129 lb 6.4 oz (58.7 kg)   SpO2  96%   BMI 22.92 kg/m  Gen: NAD, resting comfortably, improved fullness in cheeks but still some sagging skin, wasting CV: RRR, soft systolic murmur- unchanged (aortic sclerosis on echo in past) Lungs: CTAB no crackles, wheeze, rhonchi Abdomen: soft/nontender/nondistended/normal bowel sounds. No rebound or guarding.  Ext: no edema Skin: warm, dry, no rash on chin as previously noted   Assessment/Plan:  Protein-calorie malnutrition S: protein-calorie malnutrition improving- would now state mild with BMI closing in 23. Weight had been as low as 96.  A/P: much improved- appetite much better. boosts have been helpful. Continue current treatments. With improved nutrition- skin breakdowns have resolved.   Alzheimer's dementia without behavioral disturbance S: moderate to severe dementia. Per sons, was told by neurology that memory medications would not be helpful with her level of dementia- they would prefer not to start medication. Patient has near 24/7 supervision. Lives with son steve- only alone 2% of time per son. She has thrived in this environment A/P: continue regular supervision. Continue without medication  Iron deficiency anemia S: hgb has been around 10- last few checks actually up near 12- has declined workup and is aware of cancer risks. Last ferritin was ok. On iron once a day A/P: stable hemoglobin today near 12. Ferritin/iron levels pending. Continue regular iron  Future Appointments Date Time Provider [place]  03/14/2017 11:15 AM Gardiner Barefoot, DPM [facility]   Return in about 6 months (around 09/05/2017) for physical.  Orders Placed This Encounter  Procedures  . CBC    [place]  . Basic metabolic panel    [place]  . Iron, TIBC and Ferritin Panel   Return precautions advised.  Garret Reddish, MD

## 2017-03-08 NOTE — Assessment & Plan Note (Signed)
S: moderate to severe dementia. Per sons, was told by neurology that memory medications would not be helpful with her level of dementia- they would prefer not to start medication. Patient has near 24/7 supervision. Lives with son steve- only alone 2% of time per son. She has thrived in this environment A/P: continue regular supervision. Continue without medication

## 2017-03-08 NOTE — Progress Notes (Signed)
I have reviewed and agree with note, evaluation, plan.   Romona Murdy, MD  

## 2017-03-08 NOTE — Patient Instructions (Signed)
Labs before you leave but can do this after you see Cassie  Great to see you

## 2017-03-09 LAB — IRON,TIBC AND FERRITIN PANEL
%SAT: 22 % (ref 11–50)
Ferritin: 232 ng/mL (ref 20–288)
Iron: 67 ug/dL (ref 45–160)
TIBC: 298 mcg/dL (calc) (ref 250–450)

## 2017-03-14 ENCOUNTER — Encounter: Payer: Self-pay | Admitting: Podiatry

## 2017-03-14 ENCOUNTER — Ambulatory Visit: Payer: Medicare Other | Admitting: Podiatry

## 2017-03-14 DIAGNOSIS — M79674 Pain in right toe(s): Secondary | ICD-10-CM

## 2017-03-14 DIAGNOSIS — M79675 Pain in left toe(s): Secondary | ICD-10-CM

## 2017-03-14 DIAGNOSIS — B351 Tinea unguium: Secondary | ICD-10-CM

## 2017-03-14 NOTE — Progress Notes (Signed)
Complaint:  Visit Type: Patient returns to my office for continued preventative foot care services. Complaint: Patient states" my nails have grown long and thick and become painful to walk and wear shoes" . The patient presents for preventative foot care services. No changes to ROS  Podiatric Exam: Vascular: dorsalis pedis and posterior tibial pulses are palpable bilateral. Capillary return is immediate. Temperature gradient is WNL. Skin turgor WNL  Sensorium: Normal Semmes Weinstein monofilament test. Normal tactile sensation bilaterally. Nail Exam: Pt has thick disfigured discolored nails with subungual debris noted bilateral entire nail hallux through fifth toenails Ulcer Exam: There is no evidence of ulcer or pre-ulcerative changes or infection. Orthopedic Exam: Muscle tone and strength are WNL. No limitations in general ROM. No crepitus or effusions noted. Foot type and digits show no abnormalities. Bony prominences are unremarkable. Skin: No Porokeratosis. No infection or ulcers  Diagnosis:  Onychomycosis, , Pain in right toe, pain in left toes  Treatment & Plan Procedures and Treatment: Consent by patient was obtained for treatment procedures.   Debridement of mycotic and hypertrophic toenails, 1 through 5 bilateral and clearing of subungual debris. No ulceration, no infection noted.  Return Visit-Office Procedure: Patient instructed to return to the office for a follow up visit 3 months for continued evaluation and treatment.    Aneyah Lortz DPM 

## 2017-03-27 ENCOUNTER — Inpatient Hospital Stay (HOSPITAL_COMMUNITY)
Admission: EM | Admit: 2017-03-27 | Discharge: 2017-04-01 | DRG: 872 | Disposition: A | Discharge status: Skilled Nursing Facility | Payer: Medicare Other | Attending: Internal Medicine | Admitting: Internal Medicine

## 2017-03-27 ENCOUNTER — Encounter: Payer: Self-pay | Admitting: Family Medicine

## 2017-03-27 ENCOUNTER — Encounter (HOSPITAL_COMMUNITY): Payer: Self-pay

## 2017-03-27 ENCOUNTER — Telehealth: Payer: Self-pay | Admitting: *Deleted

## 2017-03-27 ENCOUNTER — Emergency Department (HOSPITAL_COMMUNITY): Payer: Medicare Other

## 2017-03-27 ENCOUNTER — Other Ambulatory Visit: Payer: Self-pay

## 2017-03-27 ENCOUNTER — Ambulatory Visit: Payer: Medicare Other | Admitting: Family Medicine

## 2017-03-27 DIAGNOSIS — F0280 Dementia in other diseases classified elsewhere without behavioral disturbance: Secondary | ICD-10-CM | POA: Diagnosis present

## 2017-03-27 DIAGNOSIS — Z96659 Presence of unspecified artificial knee joint: Secondary | ICD-10-CM

## 2017-03-27 DIAGNOSIS — I959 Hypotension, unspecified: Secondary | ICD-10-CM | POA: Diagnosis present

## 2017-03-27 DIAGNOSIS — A419 Sepsis, unspecified organism: Secondary | ICD-10-CM | POA: Diagnosis not present

## 2017-03-27 DIAGNOSIS — Z91013 Allergy to seafood: Secondary | ICD-10-CM

## 2017-03-27 DIAGNOSIS — Z8249 Family history of ischemic heart disease and other diseases of the circulatory system: Secondary | ICD-10-CM | POA: Diagnosis not present

## 2017-03-27 DIAGNOSIS — I10 Essential (primary) hypertension: Secondary | ICD-10-CM | POA: Diagnosis present

## 2017-03-27 DIAGNOSIS — R410 Disorientation, unspecified: Secondary | ICD-10-CM | POA: Diagnosis not present

## 2017-03-27 DIAGNOSIS — M419 Scoliosis, unspecified: Secondary | ICD-10-CM | POA: Diagnosis not present

## 2017-03-27 DIAGNOSIS — R509 Fever, unspecified: Secondary | ICD-10-CM | POA: Diagnosis not present

## 2017-03-27 DIAGNOSIS — N390 Urinary tract infection, site not specified: Secondary | ICD-10-CM | POA: Diagnosis present

## 2017-03-27 DIAGNOSIS — Z96651 Presence of right artificial knee joint: Secondary | ICD-10-CM | POA: Diagnosis present

## 2017-03-27 DIAGNOSIS — Z7982 Long term (current) use of aspirin: Secondary | ICD-10-CM

## 2017-03-27 DIAGNOSIS — E441 Mild protein-calorie malnutrition: Secondary | ICD-10-CM

## 2017-03-27 DIAGNOSIS — R531 Weakness: Secondary | ICD-10-CM | POA: Diagnosis not present

## 2017-03-27 DIAGNOSIS — Z96642 Presence of left artificial hip joint: Secondary | ICD-10-CM | POA: Diagnosis not present

## 2017-03-27 DIAGNOSIS — G309 Alzheimer's disease, unspecified: Secondary | ICD-10-CM | POA: Diagnosis not present

## 2017-03-27 DIAGNOSIS — N3000 Acute cystitis without hematuria: Secondary | ICD-10-CM | POA: Diagnosis not present

## 2017-03-27 DIAGNOSIS — E876 Hypokalemia: Secondary | ICD-10-CM | POA: Diagnosis present

## 2017-03-27 DIAGNOSIS — F0150 Vascular dementia without behavioral disturbance: Secondary | ICD-10-CM | POA: Diagnosis not present

## 2017-03-27 DIAGNOSIS — E46 Unspecified protein-calorie malnutrition: Secondary | ICD-10-CM | POA: Diagnosis present

## 2017-03-27 DIAGNOSIS — Z66 Do not resuscitate: Secondary | ICD-10-CM | POA: Diagnosis not present

## 2017-03-27 DIAGNOSIS — R404 Transient alteration of awareness: Secondary | ICD-10-CM | POA: Diagnosis not present

## 2017-03-27 DIAGNOSIS — E43 Unspecified severe protein-calorie malnutrition: Secondary | ICD-10-CM | POA: Diagnosis not present

## 2017-03-27 DIAGNOSIS — G301 Alzheimer's disease with late onset: Secondary | ICD-10-CM

## 2017-03-27 DIAGNOSIS — R627 Adult failure to thrive: Secondary | ICD-10-CM | POA: Diagnosis not present

## 2017-03-27 LAB — CBC WITH DIFFERENTIAL/PLATELET
BASOS ABS: 0 10*3/uL (ref 0.0–0.1)
BASOS PCT: 0 %
EOS ABS: 0 10*3/uL (ref 0.0–0.7)
EOS PCT: 0 %
HCT: 35.5 % — ABNORMAL LOW (ref 36.0–46.0)
Hemoglobin: 12 g/dL (ref 12.0–15.0)
Lymphocytes Relative: 11 %
Lymphs Abs: 1.2 10*3/uL (ref 0.7–4.0)
MCH: 32 pg (ref 26.0–34.0)
MCHC: 33.8 g/dL (ref 30.0–36.0)
MCV: 94.7 fL (ref 78.0–100.0)
MONO ABS: 0.7 10*3/uL (ref 0.1–1.0)
MONOS PCT: 6 %
NEUTROS ABS: 9.1 10*3/uL — AB (ref 1.7–7.7)
Neutrophils Relative %: 83 %
PLATELETS: 185 10*3/uL (ref 150–400)
RBC: 3.75 MIL/uL — ABNORMAL LOW (ref 3.87–5.11)
RDW: 12.8 % (ref 11.5–15.5)
WBC: 11 10*3/uL — ABNORMAL HIGH (ref 4.0–10.5)

## 2017-03-27 LAB — URINALYSIS, ROUTINE W REFLEX MICROSCOPIC
BILIRUBIN URINE: NEGATIVE
GLUCOSE, UA: NEGATIVE mg/dL
Nitrite: POSITIVE — AB
Specific Gravity, Urine: 1.01 (ref 1.005–1.030)
pH: 8 (ref 5.0–8.0)

## 2017-03-27 LAB — COMPREHENSIVE METABOLIC PANEL
ALK PHOS: 64 U/L (ref 38–126)
ALT: 15 U/L (ref 14–54)
AST: 23 U/L (ref 15–41)
Albumin: 3.5 g/dL (ref 3.5–5.0)
Anion gap: 11 (ref 5–15)
BILIRUBIN TOTAL: 0.7 mg/dL (ref 0.3–1.2)
BUN: 20 mg/dL (ref 6–20)
CALCIUM: 9 mg/dL (ref 8.9–10.3)
CO2: 25 mmol/L (ref 22–32)
CREATININE: 0.95 mg/dL (ref 0.44–1.00)
Chloride: 100 mmol/L — ABNORMAL LOW (ref 101–111)
GFR calc Af Amer: 59 mL/min — ABNORMAL LOW (ref >60)
GFR, EST NON AFRICAN AMERICAN: 51 mL/min — AB (ref >60)
GLUCOSE: 134 mg/dL — AB (ref 65–99)
POTASSIUM: 3.7 mmol/L (ref 3.5–5.1)
Sodium: 136 mmol/L (ref 135–145)
TOTAL PROTEIN: 7.1 g/dL (ref 6.5–8.1)

## 2017-03-27 LAB — PROTIME-INR
INR: 1.05
Prothrombin Time: 13.6 seconds (ref 11.4–15.2)

## 2017-03-27 LAB — URINALYSIS, MICROSCOPIC (REFLEX): SQUAMOUS EPITHELIAL / LPF: NONE SEEN

## 2017-03-27 LAB — I-STAT CG4 LACTIC ACID, ED: LACTIC ACID, VENOUS: 1.12 mmol/L (ref 0.5–1.9)

## 2017-03-27 MED ORDER — SODIUM CHLORIDE 0.9 % IV BOLUS (SEPSIS)
1000.0000 mL | Freq: Once | INTRAVENOUS | Status: AC
  Administered 2017-03-27: 1000 mL via INTRAVENOUS

## 2017-03-27 MED ORDER — ENOXAPARIN SODIUM 40 MG/0.4ML ~~LOC~~ SOLN
40.0000 mg | Freq: Every day | SUBCUTANEOUS | Status: DC
  Administered 2017-03-28 – 2017-03-31 (×4): 40 mg via SUBCUTANEOUS
  Filled 2017-03-27 (×4): qty 0.4

## 2017-03-27 MED ORDER — ACETAMINOPHEN 650 MG RE SUPP
650.0000 mg | Freq: Four times a day (QID) | RECTAL | Status: DC | PRN
  Administered 2017-03-28: 650 mg via RECTAL
  Filled 2017-03-27: qty 1

## 2017-03-27 MED ORDER — ASPIRIN 81 MG PO CHEW
81.0000 mg | CHEWABLE_TABLET | Freq: Every day | ORAL | Status: DC
  Administered 2017-03-28 – 2017-04-01 (×5): 81 mg via ORAL
  Filled 2017-03-27 (×5): qty 1

## 2017-03-27 MED ORDER — ACETAMINOPHEN 325 MG PO TABS
650.0000 mg | ORAL_TABLET | Freq: Four times a day (QID) | ORAL | Status: DC | PRN
  Administered 2017-03-28: 650 mg via ORAL
  Filled 2017-03-27 (×3): qty 2

## 2017-03-27 MED ORDER — ACETAMINOPHEN 325 MG PO TABS
650.0000 mg | ORAL_TABLET | Freq: Once | ORAL | Status: AC
  Administered 2017-03-27: 650 mg via ORAL
  Filled 2017-03-27: qty 2

## 2017-03-27 MED ORDER — SODIUM CHLORIDE 0.9 % IV SOLN
INTRAVENOUS | Status: DC
  Administered 2017-03-27 – 2017-03-29 (×3): via INTRAVENOUS

## 2017-03-27 MED ORDER — CEFTRIAXONE SODIUM 1 G IJ SOLR
1.0000 g | INTRAMUSCULAR | Status: DC
  Filled 2017-03-27: qty 10

## 2017-03-27 MED ORDER — DEXTROSE 5 % IV SOLN
1.0000 g | Freq: Once | INTRAVENOUS | Status: AC
  Administered 2017-03-27: 1 g via INTRAVENOUS
  Filled 2017-03-27: qty 10

## 2017-03-27 NOTE — ED Notes (Signed)
Pt is alert and orinted x 4 and is verbally responsive. Pt sons are present at bedside. Pt denies pain at this time. Pt observed having a productive,  cough past few days.

## 2017-03-27 NOTE — ED Notes (Signed)
Called patients son, Laverna Peace, to up date him on her assigned room.

## 2017-03-27 NOTE — ED Provider Notes (Addendum)
[facility] Provider Note   CSN: [id number] Arrival date & time: 03/27/17  1310     History   Chief Complaint Chief Complaint  Patient presents with  . Fever    HPI Diane Little is a 83 y.o. female.  Level 5 caveat for dementia.  Patient lives at home with her son and has dementia.  She is normally ambulatory with a walker.  Family reports generalized weakness since last night with associated fever.  She has been too weak to stand.  Review of systems positive for questionable cough.  40 pound weight gain in the past year secondary to better nutrition.  She has no complaints of pain.  She was incontinent of bowel.      Past Medical History:  Diagnosis Date  . Allergy   . Anemia   . Diverticulosis   . Hip fracture, left ([place]) 06/26/2012  . Hypertension   . HYPERTENSION 12/17/2006   no rx, former  . Internal hemorrhoids   . Nail fungus   . Osteoporosis   . Osteoporosis with fracture   . Rash   . TOTAL KNEE REPLACEMENT, RIGHT, HX OF 06/10/2007   r    Patient Active Problem List   Diagnosis Date Noted  . UTI (urinary tract infection) 03/27/2017  . Alzheimer's dementia without behavioral disturbance 07/13/2014  . Aortic sclerosis 05/27/2014  . Protein-calorie malnutrition ([place]) 09/08/2013  . Overactive bladder 02/28/2012  . Total knee replacement status 06/10/2007  . Iron deficiency anemia 12/17/2006  . Allergic rhinitis 12/17/2006  . Osteoporosis 12/17/2006    Past Surgical History:  Procedure Laterality Date  . CATARACT EXTRACTION Bilateral   . HIP ARTHROPLASTY Left 06/28/2012   Procedure: Left HIP HEMI ARTHROPLASTY;  Surgeon: Mauri Pole, MD;  Location: [facility];  Service: Orthopedics;  Laterality: Left;  . REPLACEMENT TOTAL KNEE Right   . TONSILLECTOMY      OB History    No data available       Home Medications    Prior to Admission medications   Medication Sig Start Date End Date Taking? Authorizing  Provider  ASPIRIN PO Take 1 tablet by mouth.   Yes [provider]  calcium-vitamin D (OSCAL WITH D 500-200) 500-200 MG-UNIT per tablet Take 1 tablet by mouth daily with breakfast.    Yes [provider]  ferrous sulfate 325 (65 FE) MG tablet Take 1 tablet (325 mg total) by mouth daily with breakfast. 07/01/12  Yes Theodis Blaze, MD  Multiple Vitamin (MULTIVITAMIN WITH MINERALS) TABS Take 1 tablet by mouth daily.   Yes [provider]  pyridoxine (B-6) 50 MG tablet Take 50 mg by mouth daily.     Yes [provider]  vitamin B-12 (CYANOCOBALAMIN) 250 MCG tablet Take 250 mcg by mouth daily.     Yes [provider]  zinc gluconate 50 MG tablet Take 50 mg by mouth daily.     Yes [provider]  fluticasone (FLONASE) 50 MCG/ACT nasal spray Place 2 sprays into the nose daily as needed. Patient not taking: Reported on 03/27/2017 02/24/13   Ricard Dillon, MD    Family History Family History  Problem Relation Age of Onset  . Heart disease Mother     Social History Social History   Tobacco Use  . Smoking status: Never Smoker  . Smokeless tobacco: Never Used  Substance Use Topics  . Alcohol use: No    Alcohol/week: 0.0 oz  .  Drug use: No     Allergies   Fish allergy; Pneumococcal vaccine polyvalent; and Ramipril   Review of Systems Review of Systems  Unable to perform ROS: Dementia     Physical Exam Updated Vital Signs BP 104/86 (BP Location: Right Arm)   Pulse (!) 118   Temp (!) 102.9 F (39.4 C) (Axillary)   Resp 20   Wt 58.9 kg (129 lb 13.6 oz)   SpO2 92%   BMI 23.00 kg/m   Physical Exam  Constitutional: She is oriented to person, place, and time.  Febrile, dehydrated.  HENT:  Head: Normocephalic and atraumatic.  Eyes: Conjunctivae are normal.  Neck: Neck supple.  Cardiovascular: Normal rate and regular rhythm.  Pulmonary/Chest: Effort normal and breath sounds normal.  Abdominal: Soft. Bowel sounds are  normal.  Musculoskeletal: Normal range of motion.  Neurological: She is alert and oriented to person, place, and time.  Skin: Skin is warm and dry.  Psychiatric:  Flat affect  Nursing note and vitals reviewed.    ED Treatments / Results  Labs (all labs ordered are listed, but only abnormal results are displayed) Labs Reviewed  COMPREHENSIVE METABOLIC PANEL - Abnormal; Notable for the following components:      Result Value   Chloride 100 (*)    Glucose, Bld 134 (*)    GFR calc non Af Amer 51 (*)    GFR calc Af Amer 59 (*)    All other components within normal limits  CBC WITH DIFFERENTIAL/PLATELET - Abnormal; Notable for the following components:   WBC 11.0 (*)    RBC 3.75 (*)    HCT 35.5 (*)    Neutro Abs 9.1 (*)    All other components within normal limits  URINALYSIS, ROUTINE W REFLEX MICROSCOPIC - Abnormal; Notable for the following components:   Hgb urine dipstick LARGE (*)    Ketones, ur TRACE (*)    Protein, ur TRACE (*)    Nitrite POSITIVE (*)    Leukocytes, UA SMALL (*)    All other components within normal limits  URINALYSIS, MICROSCOPIC (REFLEX) - Abnormal; Notable for the following components:   Bacteria, UA MANY (*)    All other components within normal limits  COMPREHENSIVE METABOLIC PANEL - Abnormal; Notable for the following components:   Calcium 8.5 (*)    Albumin 3.4 (*)    GFR calc non Af Amer 54 (*)    All other components within normal limits  CBC - Abnormal; Notable for the following components:   WBC 13.6 (*)    All other components within normal limits  CULTURE, BLOOD (ROUTINE X 2)  CULTURE, BLOOD (ROUTINE X 2)  URINE CULTURE  PROTIME-INR  I-STAT CG4 LACTIC ACID, ED    EKG  EKG Interpretation None       Radiology Dg Chest Port 1 View  Result Date: 03/27/2017 CLINICAL DATA:  Weakness. EXAM: PORTABLE CHEST 1 VIEW COMPARISON:  06/26/2012 FINDINGS: Borderline heart size, distorted by scoliosis. Negative mediastinal contours. There is  no edema, consolidation, effusion, or pneumothorax. Osteopenia. No acute osseous finding. IMPRESSION: No evidence of active disease. Electronically Signed   By: Monte Fantasia M.D.   On: 03/27/2017 15:20    Procedures Procedures (including critical care time)  Medications Ordered in ED Medications  aspirin chewable tablet 81 mg (81 mg Oral Given 03/28/17 1100)  enoxaparin (LOVENOX) injection 40 mg (40 mg Subcutaneous Not Given 03/27/17 2243)  0.9 %  sodium chloride infusion ( Intravenous New Bag/Given 03/27/17 2242)  acetaminophen (TYLENOL) tablet 650 mg (650 mg Oral Given 03/28/17 0502)    Or  acetaminophen (TYLENOL) suppository 650 mg ( Rectal See Alternative 03/28/17 0502)  cefTRIAXone (ROCEPHIN) 1 g in dextrose 5 % 50 mL IVPB (not administered)  haloperidol lactate (HALDOL) injection 0.5 mg (not administered)  sodium chloride 0.9 % bolus 1,000 mL (0 mLs Intravenous Stopped 03/27/17 1553)  cefTRIAXone (ROCEPHIN) 1 g in dextrose 5 % 50 mL IVPB (0 g Intravenous Stopped 03/27/17 2056)  acetaminophen (TYLENOL) tablet 650 mg (650 mg Oral Given 03/27/17 2044)  haloperidol lactate (HALDOL) injection 0.5 mg (0.5 mg Intramuscular Given 03/28/17 1023)     Initial Impression / Assessment and Plan / ED Course  I have reviewed the triage vital signs and the nursing notes.  Pertinent labs & imaging results that were available during my care of the patient were reviewed by me and considered in my medical decision making (see chart for details).     Patient presents with fever and generalized weakness and malaise.  She is normally ambulatory.  Urinalysis shows evidence of infection.  Will hydrate, start IV antibiotics, admit to general medicine  Final Clinical Impressions(s) / ED Diagnoses   Final diagnoses:  Fever, unspecified fever cause    ED Discharge Orders    None       Nat Christen, MD 03/28/17 1429    Nat Christen, MD 04/20/17 [place]    Nat Christen, MD 04/20/17 [phone]

## 2017-03-27 NOTE — ED Notes (Addendum)
Patients Diane Little was moved so patient has wet the bed, sample not collected. Patient also says that she cant stand to walk that she feels too week. Patient also will not allow this tech to take her orthostatic vital signs, when attempting vitals patient will not sit still and try's to take off BP cuff. When trying to sit up patient she yelled and said that she wanted to lay down. Patient given grape juice but states she is not hungry.

## 2017-03-27 NOTE — ED Notes (Signed)
Gave phone report to [place], Therapist, sports for assigned room 1323.

## 2017-03-27 NOTE — Telephone Encounter (Signed)
Spoke to patients son Diane Little, verified he is on DPR, he states that over the past couple days Diane Little has experienced generalized weakness. He states that it is difficult for her to get up to go to the bathroom. He denies any other acute symptoms like cough, urinary frequency. I spoke to Dr Yong Channel and he states that if it is safe for him to get Diane Little to the office then they can make an appointment otherwise they should go to ED. I explained this to Diane Little and he stated understanding. He is going to call his brother and then call our office back to schedule an appointment for call for the ambulance to pick up patient.

## 2017-03-27 NOTE — ED Notes (Signed)
Spoke with JIMMY Rachels [phone], patients son, to inform that she was being admitted.

## 2017-03-27 NOTE — ED Notes (Signed)
Called lab to f/u with urinalysis. Lab reports receiving urine at 1815, and is testing specimen now

## 2017-03-27 NOTE — Telephone Encounter (Signed)
Patients son Richardson Landry called back and he and his brother are going to bring Ms Cozine. Scheduled for 1:00 today.

## 2017-03-27 NOTE — ED Notes (Signed)
Bed: [facility] Expected date:  Expected time:  Means of arrival:  Comments: EMS-fever

## 2017-03-27 NOTE — ED Notes (Signed)
Hosptialist at bedside 

## 2017-03-27 NOTE — H&P (Signed)
History and Physical    Diane Little ZJI:[id number] DOB: [birth year] DOA: 03/27/2017  I have briefly reviewed the patient's prior medical records in [place]  PCP: Marin Olp, MD  Patient coming from: Home  Chief Complaint: weakness/fever  HPI: Diane Little is a 83 y.o. female with medical history significant of dementia, who is being brought to the hospital by her son due to generalized weakness over the last couple of days, and today patient could not walk so he decided to bring her to the ER.  Patient cannot contribute anything to the story so HPI is per EDP and saw over the phone.  Patient's son states there were no fevers at home, and not really any other significant symptoms as far as the patient has been complaining other than the generalized weakness.  For me patient denies any discomfort.  ED Course: In the ER patient initially had a low-grade temp however became febrile to 102.6, heart rate is in the low 100s, her blood pressure is stable in the 140s.  She is satting 96% on room air.  Blood work reveals a white count of 11 as well as grossly positive UA for an infection.  She was given ceftriaxone and we were asked to admit.  Review of Systems: Unable to obtain review of systems due to underlying dementia  Past Medical History:  Diagnosis Date  . Allergy   . Anemia   . Diverticulosis   . Hip fracture, left ([place]) 06/26/2012  . Hypertension   . HYPERTENSION 12/17/2006   no rx, former  . Internal hemorrhoids   . Nail fungus   . Osteoporosis   . Osteoporosis with fracture   . Rash   . TOTAL KNEE REPLACEMENT, RIGHT, HX OF 06/10/2007   r    Past Surgical History:  Procedure Laterality Date  . CATARACT EXTRACTION Bilateral   . HIP ARTHROPLASTY Left 06/28/2012   Procedure: Left HIP HEMI ARTHROPLASTY;  Surgeon: Mauri Pole, MD;  Location: [facility];  Service: Orthopedics;  Laterality: Left;  . REPLACEMENT TOTAL KNEE Right   . TONSILLECTOMY       reports that  has never smoked. she has never used smokeless tobacco. She reports that she does not drink alcohol or use drugs.  Allergies  Allergen Reactions  . Fish Allergy Rash    ALL SEAFOOD  . Pneumococcal Vaccine Polyvalent   . Ramipril     REACTION: rash    Family History  Problem Relation Age of Onset  . Heart disease Mother     Prior to Admission medications   Medication Sig Start Date End Date Taking? Authorizing Provider  ASPIRIN PO Take 1 tablet by mouth.   Yes [provider]  calcium-vitamin D (OSCAL WITH D 500-200) 500-200 MG-UNIT per tablet Take 1 tablet by mouth daily with breakfast.    Yes [provider]  ferrous sulfate 325 (65 FE) MG tablet Take 1 tablet (325 mg total) by mouth daily with breakfast. 07/01/12  Yes Theodis Blaze, MD  Multiple Vitamin (MULTIVITAMIN WITH MINERALS) TABS Take 1 tablet by mouth daily.   Yes [provider]  pyridoxine (B-6) 50 MG tablet Take 50 mg by mouth daily.     Yes [provider]  vitamin B-12 (CYANOCOBALAMIN) 250 MCG tablet Take 250 mcg by mouth daily.     Yes [provider]  zinc gluconate 50 MG tablet Take 50 mg by mouth daily.     Yes [provider]  fluticasone (FLONASE) 50 MCG/ACT nasal spray Place 2 sprays into the nose daily as needed. Patient not taking: Reported on 03/27/2017 02/24/13   Ricard Dillon, MD    Physical Exam: Vitals:   03/27/17 1900 03/27/17 1950 03/27/17 2034 03/27/17 2046  BP: (!) 134/56 (!) 147/60  (!) 142/73  Pulse: (!) 105 84  (!) 106  Resp: (!) 29 18  18   Temp:  98.5 F (36.9 C) (!) 102.6 F (39.2 C)   TempSrc:   Rectal   SpO2: 93% 94%  96%    Constitutional: NAD, appears confused Eyes: PERRL, lids and conjunctivae normal ENMT: Mucous membranes are moist Neck: normal, supple Respiratory: clear to auscultation bilaterally, no wheezing, no crackles. Normal respiratory effort. No accessory muscle use.  Cardiovascular: Regular rate  and rhythm, 3/6 SEM. No extremity edema. 2+ pedal pulses.  Abdomen: no tenderness, no masses palpated. Bowel sounds positive.  Musculoskeletal: no clubbing / cyanosis. Normal muscle tone.  Skin: no rashes, lesions, ulcers. No induration Neurologic: CN 2-12 grossly intact. Strength 5/5 in all 4.  Psychiatric: Alert and oriented x to self only.   Labs on Admission: I have personally reviewed following labs and imaging studies  CBC: Recent Labs  Lab 03/27/17 1340  WBC 11.0*  NEUTROABS 9.1*  HGB 12.0  HCT 35.5*  MCV 94.7  PLT 774   Basic Metabolic Panel: Recent Labs  Lab 03/27/17 1340  NA 136  K 3.7  CL 100*  CO2 25  GLUCOSE 134*  BUN 20  CREATININE 0.95  CALCIUM 9.0   GFR: CrCl cannot be calculated (Unknown ideal weight.). Liver Function Tests: Recent Labs  Lab 03/27/17 1340  AST 23  ALT 15  ALKPHOS 64  BILITOT 0.7  PROT 7.1  ALBUMIN 3.5   No results for input(s): LIPASE, AMYLASE in the last 168 hours. No results for input(s): AMMONIA in the last 168 hours. Coagulation Profile: Recent Labs  Lab 03/27/17 1340  INR 1.05   Cardiac Enzymes: No results for input(s): CKTOTAL, CKMB, CKMBINDEX, TROPONINI in the last 168 hours. BNP (last 3 results) No results for input(s): PROBNP in the last 8760 hours. HbA1C: No results for input(s): HGBA1C in the last 72 hours. CBG: No results for input(s): GLUCAP in the last 168 hours. Lipid Profile: No results for input(s): CHOL, HDL, LDLCALC, TRIG, CHOLHDL, LDLDIRECT in the last 72 hours. Thyroid Function Tests: No results for input(s): TSH, T4TOTAL, FREET4, T3FREE, THYROIDAB in the last 72 hours. Anemia Panel: No results for input(s): VITAMINB12, FOLATE, FERRITIN, TIBC, IRON, RETICCTPCT in the last 72 hours. Urine analysis:    Component Value Date/Time   COLORURINE YELLOW 03/27/2017 [place] 03/27/2017 1744   LABSPEC 1.010 03/27/2017 1744   PHURINE 8.0 03/27/2017 1744   GLUCOSEU NEGATIVE 03/27/2017  1744   HGBUR LARGE (A) 03/27/2017 1744   BILIRUBINUR NEGATIVE 03/27/2017 1744   BILIRUBINUR n 02/28/2012 1320   KETONESUR TRACE (A) 03/27/2017 1744   PROTEINUR TRACE (A) 03/27/2017 1744   UROBILINOGEN 0.2 02/28/2012 1320   NITRITE POSITIVE (A) 03/27/2017 1744   LEUKOCYTESUR SMALL (A) 03/27/2017 1744     Radiological Exams on Admission: Dg Chest Port 1 View  Result Date: 03/27/2017 CLINICAL DATA:  Weakness. EXAM: PORTABLE CHEST 1 VIEW COMPARISON:  06/26/2012 FINDINGS: Borderline heart size, distorted by scoliosis. Negative mediastinal contours. There is no edema, consolidation, effusion, or pneumothorax. Osteopenia. No acute osseous finding. IMPRESSION: No evidence of active disease. Electronically Signed   By: Angelica Chessman  Watts M.D.   On: 03/27/2017 15:20    Assessment weakness in the setting of UTI -Plan Active Problems:   Total knee replacement status   Protein-calorie malnutrition (HCC)   Alzheimer's dementia without behavioral disturbance   UTI (urinary tract infection)   Sepsis due to UTI -Meets sepsis criteria due to fever, tachycardia and a source -Place patient on ceftriaxone, blood cultures and urine cultures sent -Lactic acid normal at 1.1 -IV fluids overnight  Alzheimer's dementia  -Monitor for hospital induced delirium  Total knee replacement/weakness in the setting of UTI -PT consult in the morning  DVT prophylaxis: Lovenox  Code Status: DNR  Family Communication: d/w son over the phone Disposition Plan: admit to [facility] called: none     Admission status: observation   At the point of initial evaluation, it is my clinical opinion that admission for OBSERVATION is reasonable and necessary because the patient's presenting complaints in the context of their chronic conditions represent sufficient risk of deterioration or significant morbidity to constitute reasonable grounds for close observation in the hospital setting, but that the patient may be  medically stable for discharge from the hospital within 24 to 48 hours.     Marzetta Board, MD [facility] Pager [phone]  If 7PM-7AM, please contact night-coverage [email or web address] Password [facility]  03/27/2017, 8:54 PM

## 2017-03-27 NOTE — ED Triage Notes (Signed)
Pt brought in via EMS from home. Pt states that p has been having generalized weakness x 1 day.  Pt denies any pain of discomfort. Per EMS pt is A&Ox4 . EMS personnel states [pt had temp of 102.4 orally and was given 320 mg of tylenol.   BP 140/76 HR 99   RR 34 O2 sat 94% RA

## 2017-03-27 NOTE — ED Notes (Signed)
Pure wick placed.

## 2017-03-27 NOTE — ED Provider Notes (Signed)
19: 55-urinalysis returned, obtained by catheter sample, is consistent with urinary tract infection.  Earlier the patient had not been able to tolerate getting orthostatic testing done, will attempt again.   Patient Vitals for the past 24 hrs:  BP Temp Temp src Pulse Resp SpO2  03/27/17 1950 (!) 147/60 98.5 F (36.9 C) - 84 18 94 %  03/27/17 1654 (!) 136/119 99.4 F (37.4 C) Oral (!) 113 18 98 %  03/27/17 1342 127/62 99.9 F (37.7 C) Oral 89 (!) 22 97 %  03/27/17 1313 - 100 F (37.8 C) Oral - - -   Medications  cefTRIAXone (ROCEPHIN) 1 g in dextrose 5 % 50 mL IVPB (1 g Intravenous New Bag/Given 03/27/17 2023)  sodium chloride 0.9 % bolus 1,000 mL (0 mLs Intravenous Stopped 03/27/17 1553)  acetaminophen (TYLENOL) tablet 650 mg (650 mg Oral Given 03/27/17 2044)    8:43 PM Reevaluation with update and discussion. After initial assessment and treatment, an updated evaluation reveals patient unable to stand, secondary to suspected weakness.  Temperature now 102.  Patient to be admitted. Daleen Bo   8:40 PM-Consult complete with hospitalist. Patient case explained and discussed.  He agrees to admit patient for further evaluation and treatment. Call ended at 20: [place], [place], MD 03/27/17 2046

## 2017-03-27 NOTE — ED Notes (Signed)
Patients son reports patient usually ambulates with a walker. Attempted to ambulate patient with a walker and with Andee Poles, NT. Patient was unsteady and unable to stand completely. Informed Dr. Eulis Foster of patients condition and fever. New orders obtained.

## 2017-03-27 NOTE — ED Notes (Signed)
Patient sipping on grape juice.

## 2017-03-28 DIAGNOSIS — D509 Iron deficiency anemia, unspecified: Secondary | ICD-10-CM | POA: Diagnosis not present

## 2017-03-28 DIAGNOSIS — N390 Urinary tract infection, site not specified: Secondary | ICD-10-CM

## 2017-03-28 DIAGNOSIS — F0280 Dementia in other diseases classified elsewhere without behavioral disturbance: Secondary | ICD-10-CM | POA: Diagnosis present

## 2017-03-28 DIAGNOSIS — A419 Sepsis, unspecified organism: Secondary | ICD-10-CM | POA: Diagnosis not present

## 2017-03-28 DIAGNOSIS — Z8249 Family history of ischemic heart disease and other diseases of the circulatory system: Secondary | ICD-10-CM | POA: Diagnosis not present

## 2017-03-28 DIAGNOSIS — E46 Unspecified protein-calorie malnutrition: Secondary | ICD-10-CM | POA: Diagnosis not present

## 2017-03-28 DIAGNOSIS — Z96651 Presence of right artificial knee joint: Secondary | ICD-10-CM | POA: Diagnosis not present

## 2017-03-28 DIAGNOSIS — Z66 Do not resuscitate: Secondary | ICD-10-CM | POA: Diagnosis present

## 2017-03-28 DIAGNOSIS — R2689 Other abnormalities of gait and mobility: Secondary | ICD-10-CM | POA: Diagnosis not present

## 2017-03-28 DIAGNOSIS — Z91013 Allergy to seafood: Secondary | ICD-10-CM | POA: Diagnosis not present

## 2017-03-28 DIAGNOSIS — R509 Fever, unspecified: Secondary | ICD-10-CM | POA: Diagnosis present

## 2017-03-28 DIAGNOSIS — F0150 Vascular dementia without behavioral disturbance: Secondary | ICD-10-CM | POA: Diagnosis not present

## 2017-03-28 DIAGNOSIS — E43 Unspecified severe protein-calorie malnutrition: Secondary | ICD-10-CM

## 2017-03-28 DIAGNOSIS — R410 Disorientation, unspecified: Secondary | ICD-10-CM

## 2017-03-28 DIAGNOSIS — Z7982 Long term (current) use of aspirin: Secondary | ICD-10-CM | POA: Diagnosis not present

## 2017-03-28 DIAGNOSIS — M419 Scoliosis, unspecified: Secondary | ICD-10-CM | POA: Diagnosis present

## 2017-03-28 DIAGNOSIS — M6281 Muscle weakness (generalized): Secondary | ICD-10-CM | POA: Diagnosis not present

## 2017-03-28 DIAGNOSIS — G309 Alzheimer's disease, unspecified: Secondary | ICD-10-CM | POA: Diagnosis not present

## 2017-03-28 DIAGNOSIS — R627 Adult failure to thrive: Secondary | ICD-10-CM | POA: Diagnosis not present

## 2017-03-28 DIAGNOSIS — R1312 Dysphagia, oropharyngeal phase: Secondary | ICD-10-CM | POA: Diagnosis not present

## 2017-03-28 DIAGNOSIS — R278 Other lack of coordination: Secondary | ICD-10-CM | POA: Diagnosis not present

## 2017-03-28 DIAGNOSIS — I10 Essential (primary) hypertension: Secondary | ICD-10-CM | POA: Diagnosis not present

## 2017-03-28 DIAGNOSIS — E876 Hypokalemia: Secondary | ICD-10-CM | POA: Diagnosis not present

## 2017-03-28 DIAGNOSIS — Z96642 Presence of left artificial hip joint: Secondary | ICD-10-CM | POA: Diagnosis not present

## 2017-03-28 DIAGNOSIS — I959 Hypotension, unspecified: Secondary | ICD-10-CM | POA: Diagnosis present

## 2017-03-28 LAB — CBC
HEMATOCRIT: 38 % (ref 36.0–46.0)
HEMOGLOBIN: 12.5 g/dL (ref 12.0–15.0)
MCH: 31.7 pg (ref 26.0–34.0)
MCHC: 32.9 g/dL (ref 30.0–36.0)
MCV: 96.4 fL (ref 78.0–100.0)
Platelets: 177 10*3/uL (ref 150–400)
RBC: 3.94 MIL/uL (ref 3.87–5.11)
RDW: 12.9 % (ref 11.5–15.5)
WBC: 13.6 10*3/uL — ABNORMAL HIGH (ref 4.0–10.5)

## 2017-03-28 LAB — COMPREHENSIVE METABOLIC PANEL
ALBUMIN: 3.4 g/dL — AB (ref 3.5–5.0)
ALT: 18 U/L (ref 14–54)
ANION GAP: 9 (ref 5–15)
AST: 40 U/L (ref 15–41)
Alkaline Phosphatase: 66 U/L (ref 38–126)
BUN: 18 mg/dL (ref 6–20)
CHLORIDE: 106 mmol/L (ref 101–111)
CO2: 23 mmol/L (ref 22–32)
Calcium: 8.5 mg/dL — ABNORMAL LOW (ref 8.9–10.3)
Creatinine, Ser: 0.91 mg/dL (ref 0.44–1.00)
GFR calc Af Amer: >60 mL/min (ref >60)
GFR calc non Af Amer: 54 mL/min — ABNORMAL LOW (ref >60)
GLUCOSE: 98 mg/dL (ref 65–99)
POTASSIUM: 3.8 mmol/L (ref 3.5–5.1)
SODIUM: 138 mmol/L (ref 135–145)
TOTAL PROTEIN: 7.2 g/dL (ref 6.5–8.1)
Total Bilirubin: 0.9 mg/dL (ref 0.3–1.2)

## 2017-03-28 LAB — MRSA PCR SCREENING: MRSA by PCR: NEGATIVE

## 2017-03-28 LAB — LACTIC ACID, PLASMA
LACTIC ACID, VENOUS: 0.9 mmol/L (ref 0.5–1.9)
Lactic Acid, Venous: 2.5 mmol/L (ref 0.5–1.9)

## 2017-03-28 MED ORDER — HALOPERIDOL LACTATE 5 MG/ML IJ SOLN
0.5000 mg | Freq: Once | INTRAMUSCULAR | Status: AC
  Administered 2017-03-28: 0.5 mg via INTRAMUSCULAR
  Filled 2017-03-28: qty 1

## 2017-03-28 MED ORDER — SODIUM CHLORIDE 0.9 % IV BOLUS (SEPSIS)
500.0000 mL | Freq: Once | INTRAVENOUS | Status: AC
  Administered 2017-03-28: 500 mL via INTRAVENOUS

## 2017-03-28 MED ORDER — HALOPERIDOL LACTATE 5 MG/ML IJ SOLN
0.5000 mg | Freq: Four times a day (QID) | INTRAMUSCULAR | Status: DC | PRN
  Administered 2017-03-29 – 2017-03-31 (×3): 0.5 mg via INTRAVENOUS
  Filled 2017-03-28 (×4): qty 1

## 2017-03-28 MED ORDER — SODIUM CHLORIDE 0.9 % IV BOLUS (SEPSIS)
1000.0000 mL | Freq: Once | INTRAVENOUS | Status: AC
  Administered 2017-03-28: 1000 mL via INTRAVENOUS

## 2017-03-28 MED ORDER — VANCOMYCIN HCL 10 G IV SOLR
1250.0000 mg | Freq: Once | INTRAVENOUS | Status: AC
  Administered 2017-03-28: 1250 mg via INTRAVENOUS
  Filled 2017-03-28: qty 1250

## 2017-03-28 MED ORDER — HYDROCORTISONE NA SUCCINATE PF 100 MG IJ SOLR
50.0000 mg | Freq: Four times a day (QID) | INTRAMUSCULAR | Status: DC
  Administered 2017-03-28 – 2017-03-29 (×4): 50 mg via INTRAVENOUS
  Filled 2017-03-28 (×4): qty 2

## 2017-03-28 MED ORDER — VANCOMYCIN HCL 500 MG IV SOLR
500.0000 mg | INTRAVENOUS | Status: DC
  Filled 2017-03-28: qty 500

## 2017-03-28 MED ORDER — PIPERACILLIN-TAZOBACTAM 3.375 G IVPB
3.3750 g | Freq: Three times a day (TID) | INTRAVENOUS | Status: DC
  Administered 2017-03-28 – 2017-03-31 (×8): 3.375 g via INTRAVENOUS
  Filled 2017-03-28 (×9): qty 50

## 2017-03-28 NOTE — Progress Notes (Signed)
PT Cancellation Note  Patient Details Name: Diane Little MRN: [id number] DOB: 1925/05/02   Cancelled Treatment:     PT order received but eval deferred - RN advises pt agitated and receiving Haldol.  Will follow.   [name] 03/28/2017, 12:45 PM

## 2017-03-28 NOTE — Progress Notes (Signed)
Notified by bedside RN in regards to patient possibly being hypotensive and low O2 saturations. Upon arrival to call, patient was alert and O2 Saturations was 99%-100%. Patient appeared not to be any distress. Patient's temperature was 103.0 F axillary, suggested to beside RN to obtain a recta templ and give tylenol along with fluid bolus ordered. Patient also BP was 444P systolic, but accuracy of BP is questionable at times because patient is having trouble keeping arm still during BP measurement. If patient appears to decline, call Rapid Response.

## 2017-03-28 NOTE — Plan of Care (Signed)
CRITICAL VALUE ALERT  Critical Value:  Lactic Acid 2.5  Date & Time Notied: 03/28/17 1725  Provider Notified: Dr. Erlinda Hong  Orders Received/Actions taken: None at this time

## 2017-03-28 NOTE — Progress Notes (Signed)
PROGRESS NOTE  Diane Little TKP:[id number] DOB: [birth year] DOA: 03/27/2017 PCP: Marin Olp, MD  HPI/Recap of past 24 hours:  Continue be be febrile, agitated overnight, pulled out iv three times , spiking fever  Assessment/Plan: Active Problems:   Total knee replacement status   Protein-calorie malnutrition (HCC)   Alzheimer's dementia without behavioral disturbance   UTI (urinary tract infection)   Sepsis due to UTI -Meets sepsis criteria due to fever, tachycardia and a source - cxr no acute findings, blood cultures in process, urine culture not collected -ceftriaxone started on admission, patient clinically getting worse with hypotension, abx broadened to vanc/zosyn, awaiting culture  RN reports patient o2 sats in the 70's, it turns out it is a false reading, repeat o2 sats 99% on room air,   Hypotension: blood pressure in the 80's Fluids bolus, broaden abx, start stress dose steroids. lung clear, cxr no acute findings,     Alzheimer's dementia /delirium -lives at home with son - she pulled out three iv's overnight, will need restrain, prn haldol  Total knee replacement/weakness in the setting of UTI -PT consult in the morning  DVT prophylaxis: Lovenox  Code Status: DNR  Family Communication: son at bedside    Disposition Plan: remain in the hospital   Consultants:  none  Procedures:  none  Antibiotics:  Rocephin from admission to 11/21  Vanc/zosyn from 11/21   Objective: BP 104/86 (BP Location: Right Arm)   Pulse (!) 118   Temp (!) 102.7 F (39.3 C) (Axillary)   Resp 20   Wt 58.9 kg (129 lb 13.6 oz)   SpO2 92%   BMI 23.00 kg/m   Intake/Output Summary (Last 24 hours) at 03/28/2017 0736 Last data filed at 03/27/2017 1749 Gross per 24 hour  Intake 1000 ml  Output 50 ml  Net 950 ml   Filed Weights   03/27/17 2239  Weight: 58.9 kg (129 lb 13.6 oz)    Exam: Patient is examined daily including today on 03/28/2017,  exams remain the same as of yesterday except that has changed    General:  Shivering, covered with blanket,  only oriented to self, denies pain, follow commands inconsistently  Cardiovascular: tachycardia, + murmur  Respiratory: CTABL  Abdomen: Soft/ND/NT, positive BS  Musculoskeletal: No Edema  Neuro: alert, oriented to self only, moving all extremities inconsistently    Data Reviewed: Basic Metabolic Panel: Recent Labs  Lab 03/27/17 1340 03/28/17 0353  NA 136 138  K 3.7 3.8  CL 100* 106  CO2 25 23  GLUCOSE 134* 98  BUN 20 18  CREATININE 0.95 0.91  CALCIUM 9.0 8.5*   Liver Function Tests: Recent Labs  Lab 03/27/17 1340 03/28/17 0353  AST 23 40  ALT 15 18  ALKPHOS 64 66  BILITOT 0.7 0.9  PROT 7.1 7.2  ALBUMIN 3.5 3.4*   No results for input(s): LIPASE, AMYLASE in the last 168 hours. No results for input(s): AMMONIA in the last 168 hours. CBC: Recent Labs  Lab 03/27/17 1340 03/28/17 0353  WBC 11.0* 13.6*  NEUTROABS 9.1*  --   HGB 12.0 12.5  HCT 35.5* 38.0  MCV 94.7 96.4  PLT 185 177   Cardiac Enzymes:   No results for input(s): CKTOTAL, CKMB, CKMBINDEX, TROPONINI in the last 168 hours. BNP (last 3 results) No results for input(s): BNP in the last 8760 hours.  ProBNP (last 3 results) No results for input(s): PROBNP in the last 8760 hours.  CBG: No results for input(s):  GLUCAP in the last 168 hours.  No results found for this or any previous visit (from the past 240 hour(s)).   Studies: Dg Chest Port 1 View  Result Date: 03/27/2017 CLINICAL DATA:  Weakness. EXAM: PORTABLE CHEST 1 VIEW COMPARISON:  06/26/2012 FINDINGS: Borderline heart size, distorted by scoliosis. Negative mediastinal contours. There is no edema, consolidation, effusion, or pneumothorax. Osteopenia. No acute osseous finding. IMPRESSION: No evidence of active disease. Electronically Signed   By: Monte Fantasia M.D.   On: 03/27/2017 15:20    Scheduled Meds: . aspirin  81 mg  Oral Daily  . enoxaparin (LOVENOX) injection  40 mg Subcutaneous QHS    Continuous Infusions: . sodium chloride 75 mL/hr at 03/27/17 2242  . cefTRIAXone (ROCEPHIN)  IV       Time spent: 35 mins I have personally reviewed and interpreted on  03/28/2017 daily labs, tele strips, imagings as discussed above under date review session and assessment and plans.  I reviewed all nursing notes, pharmacy notes, consultant notes,  vitals, pertinent old records  I have discussed plan of care as described above with RN , patient and family on 03/28/2017   Florencia Reasons MD, PhD  [facility] Pager [phone]. If 7PM-7AM, please contact night-coverage at [email or web address], password [facility] 03/28/2017, 7:36 AM  LOS: 0 days

## 2017-03-28 NOTE — Progress Notes (Signed)
1520 I was called tp PT"s room by family. Pt was having rigors and c/o of being cold BP was low , T 99.4 ax R 22 P 52. 02 SAT 72. Please se flow sheet for VS. dR XU was notified. Orders received for labs ,hydration fluids and changes in IV antibiotics. A portable 02 divice was used to obtain an adequate o2 sat which was 100 %. Pt c/o difficulty breathing. O2 was started at 2 lL. Marland Kitchen Christian RN on the rapid response team was called. The decision  was to continue  Orders and monitor PT.

## 2017-03-28 NOTE — Care Management Obs Status (Signed)
[place] NOTIFICATION   Patient Details  Name: Madisyn Mawhinney MRN: [id number] Date of Birth: 07-20-1924   Medicare Observation Status Notification Given:  Yes  Signed by son as pt is confused  Lynnell Catalan, RN 03/28/2017, 3:18 PM

## 2017-03-28 NOTE — Progress Notes (Signed)
Pharmacy Antibiotic Note  Diane Little is a 83 y.o. female admitted on 03/27/2017 with sepsis.  Pharmacy has been consulted for vancomycin and zosyn dosing.  Plan: Zosyn 3.375g IV q8h (4 hour infusion time).  Vancomycin 1250 mg IV x 1 loading dose then 500 mg q24h. Daily SCr.  Weight: 129 lb 13.6 oz (58.9 kg)  Temp (24hrs), Avg:100.9 F (38.3 C), Min:98.4 F (36.9 C), Max:103 F (39.4 C)  Recent Labs  Lab 03/27/17 1340 03/27/17 1412 03/28/17 0353  WBC 11.0*  --  13.6*  CREATININE 0.95  --  0.91  LATICACIDVEN  --  1.12  --     Estimated Creatinine Clearance: 34 mL/min (by C-G formula based on SCr of 0.91 mg/dL).    Allergies  Allergen Reactions  . Fish Allergy Rash    ALL SEAFOOD  . Pneumococcal Vaccine Polyvalent   . Ramipril     REACTION: rash    Antimicrobials this admission: 11/21 Vancomycin >>  11/21 Zosyn >>   Dose adjustments this admission:   Microbiology results: 11/20 BCx:  11/21 UCx:     Thank you for allowing pharmacy to be a part of this patient's care.  Hershal Coria 03/28/2017 3:54 PM

## 2017-03-29 LAB — CBC WITH DIFFERENTIAL/PLATELET
BASOS PCT: 0 %
Basophils Absolute: 0 10*3/uL (ref 0.0–0.1)
EOS ABS: 0 10*3/uL (ref 0.0–0.7)
Eosinophils Relative: 0 %
HCT: 31.4 % — ABNORMAL LOW (ref 36.0–46.0)
Hemoglobin: 10.4 g/dL — ABNORMAL LOW (ref 12.0–15.0)
Lymphocytes Relative: 5 %
Lymphs Abs: 0.5 10*3/uL — ABNORMAL LOW (ref 0.7–4.0)
MCH: 31.2 pg (ref 26.0–34.0)
MCHC: 33.1 g/dL (ref 30.0–36.0)
MCV: 94.3 fL (ref 78.0–100.0)
MONO ABS: 0.5 10*3/uL (ref 0.1–1.0)
MONOS PCT: 5 %
NEUTROS PCT: 90 %
Neutro Abs: 9.6 10*3/uL — ABNORMAL HIGH (ref 1.7–7.7)
Platelets: 152 10*3/uL (ref 150–400)
RBC: 3.33 MIL/uL — ABNORMAL LOW (ref 3.87–5.11)
RDW: 13 % (ref 11.5–15.5)
WBC: 10.6 10*3/uL — ABNORMAL HIGH (ref 4.0–10.5)

## 2017-03-29 LAB — BASIC METABOLIC PANEL
Anion gap: 6 (ref 5–15)
BUN: 16 mg/dL (ref 6–20)
CALCIUM: 7.6 mg/dL — AB (ref 8.9–10.3)
CO2: 22 mmol/L (ref 22–32)
CREATININE: 0.8 mg/dL (ref 0.44–1.00)
Chloride: 112 mmol/L — ABNORMAL HIGH (ref 101–111)
Glucose, Bld: 137 mg/dL — ABNORMAL HIGH (ref 65–99)
Potassium: 3.3 mmol/L — ABNORMAL LOW (ref 3.5–5.1)
SODIUM: 140 mmol/L (ref 135–145)

## 2017-03-29 LAB — URINE CULTURE

## 2017-03-29 LAB — MAGNESIUM: MAGNESIUM: 1.9 mg/dL (ref 1.7–2.4)

## 2017-03-29 MED ORDER — POTASSIUM CHLORIDE 20 MEQ/15ML (10%) PO SOLN
40.0000 meq | Freq: Once | ORAL | Status: AC
  Administered 2017-03-29: 40 meq via ORAL
  Filled 2017-03-29: qty 30

## 2017-03-29 MED ORDER — HYDROCORTISONE NA SUCCINATE PF 100 MG IJ SOLR
50.0000 mg | Freq: Two times a day (BID) | INTRAMUSCULAR | Status: DC
  Administered 2017-03-29 – 2017-03-30 (×2): 50 mg via INTRAVENOUS
  Filled 2017-03-29 (×2): qty 2

## 2017-03-29 NOTE — Evaluation (Signed)
Physical Therapy Evaluation Patient Details Name: Diane Little MRN: [id number] DOB: 12-30-1924 Today's Date: 03/29/2017   History of Present Illness  83 yo female admitted with UTI. Hx of falls, dementia, L hip hemi 2014  Clinical Impression  On eval, pt required Min assist for mobility. She walked ~15 feet in the room with a RW. Pt is fearful of falling. Multimodal cueing required during session. No family present at time of eval. Pt presents with general weakness, decreased activity tolerance, and impaired gait and balance. Recommend SNF unless family can provide current level of assist. Will follow and progress activity as tolerated.     Follow Up Recommendations SNF;Supervision/Assistance - 24 hour(unless family can provide current level of care. HHPT if pt returns home)    Equipment Recommendations  None recommended by PT    Recommendations for Other Services       Precautions / Restrictions Precautions Precautions: Fall Precaution Comments: Posterior L hip hemi 2014. Pt is fearful of falling. Restrictions Weight Bearing Restrictions: No      Mobility  Bed Mobility Overal bed mobility: Needs Assistance Bed Mobility: Supine to Sit     Supine to sit: Min assist;HOB elevated     General bed mobility comments: Assist for trunk. Increased time. Multimodal cueing required.   Transfers Overall transfer level: Needs assistance Equipment used: Rolling walker (2 wheeled) Transfers: Sit to/from Stand Sit to Stand: Min assist;From elevated surface         General transfer comment: Assist to rise, stabilize, control descent. Multimodal cueing required.   Ambulation/Gait Ambulation/Gait assistance: Min assist Ambulation Distance (Feet): 15 Feet Assistive device: Rolling walker (2 wheeled) Gait Pattern/deviations: Step-through pattern;Decreased stride length     General Gait Details: Assist to stabilize pt and maneuver with RW. Slow gait speed. Pt is fearful of  falling. O2 sat 97% on RA.  Stairs            Wheelchair Mobility    Modified Rankin (Stroke Patients Only)       Balance Overall balance assessment: Needs assistance;History of Falls         Standing balance support: Bilateral upper extremity supported Standing balance-Leahy Scale: Poor Standing balance comment: requiring RW                             Pertinent Vitals/Pain Pain Assessment: Faces Faces Pain Scale: No hurt    Home Living   Living Arrangements: Children(per chart)               Additional Comments: pt is not a reliable historian. no family present during session    Prior Function           Comments: pt is not a reliable historian. no family present during session     Hand Dominance        Extremity/Trunk Assessment   Upper Extremity Assessment Upper Extremity Assessment: Generalized weakness    Lower Extremity Assessment Lower Extremity Assessment: Generalized weakness    Cervical / Trunk Assessment Cervical / Trunk Assessment: Kyphotic  Communication      Cognition Arousal/Alertness: Awake/alert Behavior During Therapy: Anxious Overall Cognitive Status: History of cognitive impairments - at baseline                                        General Comments  Exercises     Assessment/Plan    PT Assessment Patient needs continued PT services  PT Problem List Decreased strength;Decreased balance;Decreased cognition;Decreased activity tolerance;Decreased mobility;Decreased safety awareness;Decreased knowledge of use of DME       PT Treatment Interventions DME instruction;Gait training;Functional mobility training;Therapeutic activities;Therapeutic exercise;Balance training;Patient/family education    PT Goals (Current goals can be found in the Care Plan section)  Acute Rehab PT Goals Patient Stated Goal: none stated. no family present.  PT Goal Formulation: Patient unable to  participate in goal setting Time For Goal Achievement: 04/12/17 Potential to Achieve Goals: Good    Frequency Min 3X/week   Barriers to discharge        Co-evaluation               AM-PAC PT "6 Clicks" Daily Activity  Outcome Measure Difficulty turning over in bed (including adjusting bedclothes, sheets and blankets)?: Unable Difficulty moving from lying on back to sitting on the side of the bed? : Unable Difficulty sitting down on and standing up from a chair with arms (e.g., wheelchair, bedside commode, etc,.)?: Unable Help needed moving to and from a bed to chair (including a wheelchair)?: A Little Help needed walking in hospital room?: A Little Help needed climbing 3-5 steps with a railing? : A Lot 6 Click Score: 11    End of Session   Activity Tolerance: Patient tolerated treatment well Patient left: with chair alarm set;with nursing/sitter in room;with call bell/phone within reach   PT Visit Diagnosis: Muscle weakness (generalized) (M62.81);Difficulty in walking, not elsewhere classified (R26.2)    Time: 9476-5465 PT Time Calculation (min) (ACUTE ONLY): 12 min   Charges:   PT Evaluation $PT Eval Moderate Complexity: 1 Mod     PT G Codes:        Weston Anna, MPT Pager: [phone]

## 2017-03-29 NOTE — Progress Notes (Signed)
PROGRESS NOTE  Diane Little EXB:[id number] DOB: Oct 27, 1924 DOA: 03/27/2017 PCP: Marin Olp, MD  HPI/Recap of past 24 hours:  Doing better, she is sitting up in chair, sons at bedside  Assessment/Plan: Active Problems:   Total knee replacement status   Protein-calorie malnutrition (HCC)   Alzheimer's dementia without behavioral disturbance   UTI (urinary tract infection)   Sepsis due to UTI -Meets sepsis criteria due to fever, tachycardia and a source -ceftriaxone started on admission. patient with hypotension/spike high fever on 11/21, abx broadened to vanc/zosyn, stress dose steroids started on 11/21,  -drastic improvement on 11/22,cxr no acute findings,mrsa screen negative, blood culture no growth, urine culture in process, (urine culture collected after first dose of rocephin) - taper steroids, d/c vanc, continue zosyn,    Hypokalemia: replace k, repeat in am  Alzheimer's dementia /delirium -lives at home with son - she has delirium initially required restrain, prn haldol -improving  Total knee replacement/weakness in the setting of UTI -PT consult and recommend snf placement, social worker consulted  DVT prophylaxis: Lovenox  Code Status: DNR  Family Communication: sons at bedside    Disposition Plan: SNF in 1-2 days   Consultants:  none  Procedures:  none  Antibiotics:  Rocephin from admission to 11/21  Vanc from 11/21 to 11/22  zosyn from 11/21   Objective: BP (!) 116/100   Pulse (!) 101   Temp 98.9 F (37.2 C) (Axillary)   Resp 18   Wt 58.9 kg (129 lb 13.6 oz)   SpO2 95%   BMI 23.00 kg/m   Intake/Output Summary (Last 24 hours) at 03/29/2017 0758 Last data filed at 03/29/2017 0700 Gross per 24 hour  Intake 120 ml  Output 800 ml  Net -680 ml   Filed Weights   03/27/17 2239  Weight: 58.9 kg (129 lb 13.6 oz)    Exam: Patient is examined daily including today on 03/29/2017, exams remain the same as of yesterday  except that has changed    General:  NAD, pleasant, sitting up in chair, reading newspaper, she is oriented to person, not to time or place  Cardiovascular: tachycardia has resolved, + murmur  Respiratory: CTABL  Abdomen: Soft/ND/NT, positive BS  Musculoskeletal: No Edema  Neuro: alert, oriented to person, moving all extremities inconsistently    Data Reviewed: Basic Metabolic Panel: Recent Labs  Lab 03/27/17 1340 03/28/17 0353 03/29/17 0352  NA 136 138 140  K 3.7 3.8 3.3*  CL 100* 106 112*  CO2 25 23 22   GLUCOSE 134* 98 137*  BUN 20 18 16   CREATININE 0.95 0.91 0.80  CALCIUM 9.0 8.5* 7.6*  MG  --   --  1.9   Liver Function Tests: Recent Labs  Lab 03/27/17 1340 03/28/17 0353  AST 23 40  ALT 15 18  ALKPHOS 64 66  BILITOT 0.7 0.9  PROT 7.1 7.2  ALBUMIN 3.5 3.4*   No results for input(s): LIPASE, AMYLASE in the last 168 hours. No results for input(s): AMMONIA in the last 168 hours. CBC: Recent Labs  Lab 03/27/17 1340 03/28/17 0353 03/29/17 0352  WBC 11.0* 13.6* 10.6*  NEUTROABS 9.1*  --  9.6*  HGB 12.0 12.5 10.4*  HCT 35.5* 38.0 31.4*  MCV 94.7 96.4 94.3  PLT 185 177 152   Cardiac Enzymes:   No results for input(s): CKTOTAL, CKMB, CKMBINDEX, TROPONINI in the last 168 hours. BNP (last 3 results) No results for input(s): BNP in the last 8760 hours.  ProBNP (last  3 results) No results for input(s): PROBNP in the last 8760 hours.  CBG: No results for input(s): GLUCAP in the last 168 hours.  Recent Results (from the past 240 hour(s))  Culture, blood (Routine x 2)     Status: None (Preliminary result)   Collection Time: 03/27/17  1:40 PM  Result Value Ref Range Status   Specimen Description BLOOD LEFT ANTECUBITAL  Final   Special Requests   Final    BOTTLES DRAWN AEROBIC AND ANAEROBIC Blood Culture adequate volume   Culture   Final    NO GROWTH < 24 HOURS Performed at [facility], [address], [place], [place] [address]    Report Status  PENDING  Incomplete  Culture, blood (Routine x 2)     Status: None (Preliminary result)   Collection Time: 03/27/17  1:44 PM  Result Value Ref Range Status   Specimen Description BLOOD RIGHT HAND  Final   Special Requests   Final    BOTTLES DRAWN AEROBIC AND ANAEROBIC Blood Culture adequate volume   Culture   Final    NO GROWTH < 24 HOURS Performed at [facility], [place] [address], [place], [place] [address]    Report Status PENDING  Incomplete  MRSA PCR Screening     Status: None   Collection Time: 03/28/17  5:33 PM  Result Value Ref Range Status   MRSA by PCR NEGATIVE NEGATIVE Final    Comment:        The GeneXpert MRSA Assay (FDA approved for NASAL specimens only), is one component of a comprehensive MRSA colonization surveillance program. It is not intended to diagnose MRSA infection nor to guide or monitor treatment for MRSA infections.      Studies: No results found.  Scheduled Meds: . aspirin  81 mg Oral Daily  . enoxaparin (LOVENOX) injection  40 mg Subcutaneous QHS  . hydrocortisone sod succinate (SOLU-CORTEF) inj  50 mg Intravenous Q6H    Continuous Infusions: . sodium chloride 75 mL/hr at 03/28/17 1548  . piperacillin-tazobactam (ZOSYN)  IV 3.375 g (03/29/17 0032)  . vancomycin       Time spent: 35 mins I have personally reviewed and interpreted on  03/29/2017 daily labs, imagings as discussed above under date review session and assessment and plans.  I reviewed all nursing notes, pharmacy notes,  vitals, pertinent old records  I have discussed plan of care as described above with RN , patient and family on 03/29/2017   Florencia Reasons MD, PhD  [facility] Pager [phone]. If 7PM-7AM, please contact night-coverage at [email or web address], password [facility] 03/29/2017, 7:58 AM  LOS: 1 day

## 2017-03-30 DIAGNOSIS — R627 Adult failure to thrive: Secondary | ICD-10-CM

## 2017-03-30 DIAGNOSIS — E876 Hypokalemia: Secondary | ICD-10-CM

## 2017-03-30 LAB — BASIC METABOLIC PANEL
ANION GAP: 6 (ref 5–15)
BUN: 24 mg/dL — ABNORMAL HIGH (ref 6–20)
CALCIUM: 7.7 mg/dL — AB (ref 8.9–10.3)
CO2: 21 mmol/L — AB (ref 22–32)
Chloride: 114 mmol/L — ABNORMAL HIGH (ref 101–111)
Creatinine, Ser: 0.91 mg/dL (ref 0.44–1.00)
GFR, EST NON AFRICAN AMERICAN: 54 mL/min — AB (ref >60)
Glucose, Bld: 133 mg/dL — ABNORMAL HIGH (ref 65–99)
Potassium: 3.7 mmol/L (ref 3.5–5.1)
Sodium: 141 mmol/L (ref 135–145)

## 2017-03-30 LAB — CBC WITH DIFFERENTIAL/PLATELET
Basophils Absolute: 0 10*3/uL (ref 0.0–0.1)
Basophils Relative: 0 %
Eosinophils Absolute: 0 10*3/uL (ref 0.0–0.7)
Eosinophils Relative: 0 %
HEMATOCRIT: 30.7 % — AB (ref 36.0–46.0)
Hemoglobin: 10.2 g/dL — ABNORMAL LOW (ref 12.0–15.0)
LYMPHS ABS: 0.5 10*3/uL — AB (ref 0.7–4.0)
LYMPHS PCT: 5 %
MCH: 31.1 pg (ref 26.0–34.0)
MCHC: 33.2 g/dL (ref 30.0–36.0)
MCV: 93.6 fL (ref 78.0–100.0)
MONO ABS: 0.8 10*3/uL (ref 0.1–1.0)
Monocytes Relative: 8 %
NEUTROS ABS: 8.6 10*3/uL — AB (ref 1.7–7.7)
Neutrophils Relative %: 87 %
PLATELETS: 182 10*3/uL (ref 150–400)
RBC: 3.28 MIL/uL — ABNORMAL LOW (ref 3.87–5.11)
RDW: 12.9 % (ref 11.5–15.5)
WBC: 9.9 10*3/uL (ref 4.0–10.5)

## 2017-03-30 MED ORDER — POTASSIUM CHLORIDE CRYS ER 20 MEQ PO TBCR
40.0000 meq | EXTENDED_RELEASE_TABLET | Freq: Once | ORAL | Status: AC
  Administered 2017-03-30: 40 meq via ORAL
  Filled 2017-03-30: qty 2

## 2017-03-30 NOTE — Progress Notes (Signed)
Physical Therapy Treatment Patient Details Name: Diane Little MRN: [id number] DOB: [birth year] Today's Date: 03/30/2017    History of Present Illness 83 yo female admitted with UTI. Hx of falls, dementia, L hip hemi 2014    PT Comments    Progressing with mobility. Bowel incontinence during session-total assist for hygiene. Continue to recommend SNF.    Follow Up Recommendations  SNF     Equipment Recommendations  None recommended by PT    Recommendations for Other Services       Precautions / Restrictions Precautions Precautions: Fall Precaution Comments: Posterior L hip hemi 2014. Pt is fearful of falling. Restrictions Weight Bearing Restrictions: No    Mobility  Bed Mobility Overal bed mobility: Needs Assistance Bed Mobility: Supine to Sit     Supine to sit: HOB elevated;Min assist     General bed mobility comments: Assist for trunk. Increased time. Multimodal cueing required.   Transfers Overall transfer level: Needs assistance Equipment used: Rolling walker (2 wheeled) Transfers: Sit to/from [facility] Sit to Stand: Min assist;From elevated surface Stand pivot transfers: Min assist;From elevated surface       General transfer comment: Assist to rise, stabilize, control descent. Multimodal cueing required. Stand pivot, bed to bsc, with RW.   Ambulation/Gait Ambulation/Gait assistance: Min assist Ambulation Distance (Feet): 15 Feet Assistive device: Rolling walker (2 wheeled) Gait Pattern/deviations: Step-through pattern;Decreased stride length     General Gait Details: Assist to stabilize pt and maneuver with RW. Slow gait speed.    Stairs            Wheelchair Mobility    Modified Rankin (Stroke Patients Only)       Balance Overall balance assessment: Needs assistance         Standing balance support: Bilateral upper extremity supported Standing balance-Leahy Scale: Poor Standing balance comment: requiring  RW                            Cognition Arousal/Alertness: Awake/alert Behavior During Therapy: Anxious Overall Cognitive Status: History of cognitive impairments - at baseline                                        Exercises      General Comments        Pertinent Vitals/Pain Pain Assessment: Faces Faces Pain Scale: Hurts a little bit Pain Location: LEs Pain Descriptors / Indicators: Sore Pain Intervention(s): Monitored during session    Home Living                      Prior Function            PT Goals (current goals can now be found in the care plan section) Progress towards PT goals: Progressing toward goals    Frequency    Min 3X/week      PT Plan Current plan remains appropriate    Co-evaluation              AM-PAC PT "6 Clicks" Daily Activity  Outcome Measure  Difficulty turning over in bed (including adjusting bedclothes, sheets and blankets)?: Unable Difficulty moving from lying on back to sitting on the side of the bed? : Unable Difficulty sitting down on and standing up from a chair with arms (e.g., wheelchair, bedside commode, etc,.)?: Unable Help needed moving to and  from a bed to chair (including a wheelchair)?: A Little Help needed walking in hospital room?: A Little Help needed climbing 3-5 steps with a railing? : A Lot 6 Click Score: 11    End of Session Equipment Utilized During Treatment: Gait belt Activity Tolerance: Patient tolerated treatment well Patient left: with call bell/phone within reach;in chair;with chair alarm set;with nursing/sitter in room   PT Visit Diagnosis: Muscle weakness (generalized) (M62.81);Difficulty in walking, not elsewhere classified (R26.2)     Time: 0964-3838 PT Time Calculation (min) (ACUTE ONLY): 27 min  Charges:  $Gait Training: 8-22 mins $Therapeutic Activity: 8-22 mins                    G Codes:          Weston Anna, MPT Pager: [phone]

## 2017-03-30 NOTE — Clinical Social Work Note (Signed)
Clinical Social Work Assessment  Patient Details  Name: Diane Little MRN: [id number] Date of Birth: [birth year]  Date of referral:  03/30/17               Reason for consult:  Facility Placement, Discharge Planning                Permission sought to share information with:  Facility Sport and exercise psychologist, Family Supports Permission granted to share information::     Name::        Agency::     Relationship::     Contact Information:     Housing/Transportation Living arrangements for the past 2 months:  Single Family Home Source of Information:  Adult Children Patient Interpreter Needed:  None Criminal Activity/Legal Involvement Pertinent to Current Situation/Hospitalization:  No - Comment as needed Significant Relationships:  Adult Children Lives with:  Adult Children Do you feel safe going back to the place where you live?  Yes Need for family participation in patient care:  Yes (Comment)  Care giving concerns:  No care giving concerns at the time of assessment.    Social Worker assessment / plan:  LCSW consulted for SNF placement.  Patient was admitted to the hospital for  weakness/fever.  Shannen Flansburg is a 83 y.o. female with medical history significant of dementia, who is being brought to the hospital by her son due to generalized weakness over the last couple of days, and today patient could not walk so he decided to bring her to the ER.    According to medical records patient is unable to participate in assessment. LCSW went to patients room to complete assessment. No family present. Patient NT was in the room with patient.   LCSW spoke with son Schuyler Behan to determine patients baseline. Patient lives with her sone, Jimmys sibling. Son, Laverna Peace lives next door.   Son reports that patient is independent with feeding, dressing and walking at home. Son reports that patient uses a walker to ambultate at home. Son prepares meals for patient and makes sure she eats.  He provides any assistance that patient may need at home.   Family is agreeable to SNF at dc.  PLAN: Patient will go to SNF at DC. LCSW will fax patient out over SNF HUB.        Employment status:  Retired Nurse, adult PT Recommendations:  [place] / Referral to community resources:  [place]  Patient/Family's Response to care:  Not assessed   Patient/Family's Understanding of and Emotional Response to Diagnosis, Current Treatment, and Prognosis:  Family appeared to be unclear of what caused patients decline and inability to walk. Family did not have many questions for LCSW. Family is not familiar with SNF, but open to options.   Emotional Assessment Appearance:  Appears stated age Attitude/Demeanor/Rapport:  Unable to Assess Affect (typically observed):  Unable to Assess Orientation:  Oriented to Self Alcohol / Substance use:  Not Applicable Psych involvement (Current and /or in the community):  No (Comment)  Discharge Needs  Concerns to be addressed:  No discharge needs identified Readmission within the last 30 days:  No Current discharge risk:  None Barriers to Discharge:  Continued Medical Work up   [employer], LCSW 03/30/2017, 12:16 PM

## 2017-03-30 NOTE — NC FL2 (Signed)
  [place] LEVEL OF CARE SCREENING TOOL     IDENTIFICATION  Patient Name: Diane Little Birthdate: 1924/11/14 Sex: female Admission Date (Current Location): 03/27/2017  [facility] and [place] Number:  Herbalist and Address:  [facility],  [place] [address] [place]      Provider Number: [id number]  Attending Physician Name and Address:  Florencia Reasons, MD  Relative Name and Phone Number:       Current Level of Care: Hospital Recommended Level of Care: [place] Prior Approval Number:    Date Approved/Denied:   PASRR Number: [id number]  Discharge Plan: SNF    Current Diagnoses: Patient Active Problem List   Diagnosis Date Noted  . UTI (urinary tract infection) 03/27/2017  . Alzheimer's dementia without behavioral disturbance 07/13/2014  . Aortic sclerosis 05/27/2014  . Protein-calorie malnutrition ([place]) 09/08/2013  . Overactive bladder 02/28/2012  . Total knee replacement status 06/10/2007  . Iron deficiency anemia 12/17/2006  . Allergic rhinitis 12/17/2006  . Osteoporosis 12/17/2006    Orientation RESPIRATION BLADDER Height & Weight     Self, Place  Normal Incontinent Weight: 166 lb (75.3 kg)(Per bed scale) Height:     BEHAVIORAL SYMPTOMS/MOOD NEUROLOGICAL BOWEL NUTRITION STATUS      Incontinent Diet(See Dc Summary)  AMBULATORY STATUS COMMUNICATION OF NEEDS Skin   Extensive Assist Verbally Normal                       Personal Care Assistance Level of Assistance  Bathing, Feeding, Dressing Bathing Assistance: Limited assistance Feeding assistance: Independent Dressing Assistance: Limited assistance     Functional Limitations Info  Sight, Hearing, Speech Sight Info: Adequate Hearing Info: Adequate Speech Info: Adequate    SPECIAL CARE FACTORS FREQUENCY  PT (By licensed PT), OT (By licensed OT)     PT Frequency: 5x/week OT Frequency: 5x/week            Contractures Contractures  Info: Not present    Additional Factors Info  Code Status, Allergies Code Status Info: DNR Allergies Info: Fish Allergy, Pneumococcal Vaccine Polyvalent, Ramipril           Current Medications (03/30/2017):  This is the current hospital active medication list Current Facility-Administered Medications  Medication Dose Route Frequency Provider Last Rate Last Dose  . acetaminophen (TYLENOL) tablet 650 mg  650 mg Oral Q6H PRN Caren Griffins, MD   650 mg at 03/28/17 0502   Or  . acetaminophen (TYLENOL) suppository 650 mg  650 mg Rectal Q6H PRN Caren Griffins, MD   650 mg at 03/28/17 1624  . aspirin chewable tablet 81 mg  81 mg Oral Daily Caren Griffins, MD   81 mg at 03/30/17 0844  . enoxaparin (LOVENOX) injection 40 mg  40 mg Subcutaneous QHS Caren Griffins, MD   40 mg at 03/29/17 2131  . haloperidol lactate (HALDOL) injection 0.5 mg  0.5 mg Intravenous Q6H PRN Florencia Reasons, MD   0.5 mg at 03/30/17 0843  . piperacillin-tazobactam (ZOSYN) IVPB 3.375 g  3.375 g Intravenous Q8H Dara Hoyer, RPH 12.5 mL/hr at 03/30/17 0845 3.375 g at 03/30/17 0845     Discharge Medications: Please see discharge summary for a list of discharge medications.  Relevant Imaging Results:  Relevant Lab Results:   Additional Information SSN: [id number]  Servando Snare, LCSW

## 2017-03-30 NOTE — Progress Notes (Signed)
PROGRESS NOTE  Diane Little ONG:[id number] DOB: 1925/02/06 DOA: 03/27/2017 PCP: Marin Olp, MD  HPI/Recap of past 24 hours:  Doing better, she is up with assistance and using bedside commode.   Assessment/Plan: Active Problems:   Total knee replacement status   Protein-calorie malnutrition (HCC)   Alzheimer's dementia without behavioral disturbance   UTI (urinary tract infection)   Sepsis due to UTI -Meets sepsis criteria due to fever, tachycardia and a source -ceftriaxone started on admission. patient with hypotension/spike high fever on 11/21, abx broadened to vanc/zosyn, stress dose steroids started on 11/21,  -drastic improvement on 11/22,cxr no acute findings,mrsa screen negative, blood culture no growth, urine culture in process, (urine culture collected after first dose of rocephin) - taper steroids, d/c vanc, continue zosyn,    Hypokalemia: replace k, repeat in am  Alzheimer's dementia /delirium -lives at home with son - she has delirium initially required restrain, prn haldol -improving  Total knee replacement/weakness in the setting of UTI -PT consult and recommend snf placement, social worker consulted  DVT prophylaxis: Lovenox  Code Status: DNR  Family Communication: sons at bedside    Disposition Plan: SNF once bed available , hopefully on 11/24   Consultants:  none  Procedures:  none  Antibiotics:  Rocephin from admission to 11/21  Vanc from 11/21 to 11/22  zosyn from 11/21   Objective: BP 130/70 (BP Location: Right Arm)   Pulse 77   Temp 98.2 F (36.8 C) (Oral)   Resp 20   Wt 75.3 kg (166 lb) Comment: Per bed scale  SpO2 94%   BMI 29.41 kg/m   Intake/Output Summary (Last 24 hours) at 03/30/2017 1103 Last data filed at 03/30/2017 4401 Gross per 24 hour  Intake 1820 ml  Output 450 ml  Net 1370 ml   Filed Weights   03/27/17 2239 03/29/17 2340  Weight: 58.9 kg (129 lb 13.6 oz) 75.3 kg (166 lb)     Exam: Patient is examined daily including today on 03/30/2017, exams remain the same as of yesterday except that has changed    General:  NAD, on bedside commode, she is oriented to person, not to time or place  Cardiovascular: tachycardia has resolved, + murmur  Respiratory: CTABL  Abdomen: Soft/ND/NT, positive BS  Musculoskeletal: No Edema  Neuro: alert, oriented to person, moving all extremities  Data Reviewed: Basic Metabolic Panel: Recent Labs  Lab 03/27/17 1340 03/28/17 0353 03/29/17 0352 03/30/17 0641  NA 136 138 140 141  K 3.7 3.8 3.3* 3.7  CL 100* 106 112* 114*  CO2 25 23 22  21*  GLUCOSE 134* 98 137* 133*  BUN 20 18 16  24*  CREATININE 0.95 0.91 0.80 0.91  CALCIUM 9.0 8.5* 7.6* 7.7*  MG  --   --  1.9  --    Liver Function Tests: Recent Labs  Lab 03/27/17 1340 03/28/17 0353  AST 23 40  ALT 15 18  ALKPHOS 64 66  BILITOT 0.7 0.9  PROT 7.1 7.2  ALBUMIN 3.5 3.4*   No results for input(s): LIPASE, AMYLASE in the last 168 hours. No results for input(s): AMMONIA in the last 168 hours. CBC: Recent Labs  Lab 03/27/17 1340 03/28/17 0353 03/29/17 0352 03/30/17 0641  WBC 11.0* 13.6* 10.6* 9.9  NEUTROABS 9.1*  --  9.6* 8.6*  HGB 12.0 12.5 10.4* 10.2*  HCT 35.5* 38.0 31.4* 30.7*  MCV 94.7 96.4 94.3 93.6  PLT 185 177 152 182   Cardiac Enzymes:   No results  for input(s): CKTOTAL, CKMB, CKMBINDEX, TROPONINI in the last 168 hours. BNP (last 3 results) No results for input(s): BNP in the last 8760 hours.  ProBNP (last 3 results) No results for input(s): PROBNP in the last 8760 hours.  CBG: No results for input(s): GLUCAP in the last 168 hours.  Recent Results (from the past 240 hour(s))  Culture, blood (Routine x 2)     Status: None (Preliminary result)   Collection Time: 03/27/17  1:40 PM  Result Value Ref Range Status   Specimen Description BLOOD LEFT ANTECUBITAL  Final   Special Requests   Final    BOTTLES DRAWN AEROBIC AND ANAEROBIC Blood  Culture adequate volume   Culture   Final    NO GROWTH 2 DAYS Performed at [facility], [address], [place], [place] [address]    Report Status PENDING  Incomplete  Culture, blood (Routine x 2)     Status: None (Preliminary result)   Collection Time: 03/27/17  1:44 PM  Result Value Ref Range Status   Specimen Description BLOOD RIGHT HAND  Final   Special Requests   Final    BOTTLES DRAWN AEROBIC AND ANAEROBIC Blood Culture adequate volume   Culture   Final    NO GROWTH 2 DAYS Performed at [facility], [place] [address], [place], [place] [address]    Report Status PENDING  Incomplete  Culture, Urine     Status: Abnormal   Collection Time: 03/28/17  2:33 PM  Result Value Ref Range Status   Specimen Description URINE, CATHETERIZED  Final   Special Requests NONE  Final   Culture (A)  Final    <10,000 COLONIES/mL INSIGNIFICANT GROWTH Performed at [facility], [place] [address], [place], [place] [address]    Report Status 03/29/2017 FINAL  Final  MRSA PCR Screening     Status: None   Collection Time: 03/28/17  5:33 PM  Result Value Ref Range Status   MRSA by PCR NEGATIVE NEGATIVE Final    Comment:        The GeneXpert MRSA Assay (FDA approved for NASAL specimens only), is one component of a comprehensive MRSA colonization surveillance program. It is not intended to diagnose MRSA infection nor to guide or monitor treatment for MRSA infections.      Studies: No results found.  Scheduled Meds: . aspirin  81 mg Oral Daily  . enoxaparin (LOVENOX) injection  40 mg Subcutaneous QHS  . hydrocortisone sod succinate (SOLU-CORTEF) inj  50 mg Intravenous Q12H  . potassium chloride  40 mEq Oral Once    Continuous Infusions: . sodium chloride 75 mL/hr at 03/29/17 0905  . piperacillin-tazobactam (ZOSYN)  IV 3.375 g (03/30/17 0845)     Time spent: 25 mins I have personally reviewed and interpreted on  03/30/2017 daily labs, imagings as discussed above under  date review session and assessment and plans.  I reviewed all nursing notes, pharmacy notes,  vitals, pertinent old records  I have discussed plan of care as described above with RN , patient  on 03/30/2017   Florencia Reasons MD, PhD  [facility] Pager [phone]. If 7PM-7AM, please contact night-coverage at [email or web address], password [facility] 03/30/2017, 11:03 AM  LOS: 2 days

## 2017-03-31 LAB — BASIC METABOLIC PANEL
Anion gap: 5 (ref 5–15)
BUN: 22 mg/dL — AB (ref 6–20)
CO2: 23 mmol/L (ref 22–32)
Calcium: 8.3 mg/dL — ABNORMAL LOW (ref 8.9–10.3)
Chloride: 113 mmol/L — ABNORMAL HIGH (ref 101–111)
Creatinine, Ser: 0.86 mg/dL (ref 0.44–1.00)
GFR calc Af Amer: >60 mL/min (ref >60)
GFR, EST NON AFRICAN AMERICAN: 58 mL/min — AB (ref >60)
GLUCOSE: 98 mg/dL (ref 65–99)
POTASSIUM: 3.5 mmol/L (ref 3.5–5.1)
Sodium: 141 mmol/L (ref 135–145)

## 2017-03-31 LAB — MAGNESIUM: Magnesium: 2 mg/dL (ref 1.7–2.4)

## 2017-03-31 MED ORDER — PYRIDOXINE HCL 25 MG PO TABS
50.0000 mg | ORAL_TABLET | Freq: Every day | ORAL | Status: DC
  Administered 2017-03-31 – 2017-04-01 (×2): 50 mg via ORAL
  Filled 2017-03-31 (×2): qty 2

## 2017-03-31 MED ORDER — POTASSIUM CHLORIDE CRYS ER 20 MEQ PO TBCR
40.0000 meq | EXTENDED_RELEASE_TABLET | Freq: Once | ORAL | Status: AC
  Administered 2017-03-31: 40 meq via ORAL
  Filled 2017-03-31: qty 2

## 2017-03-31 MED ORDER — AMOXICILLIN-POT CLAVULANATE 875-125 MG PO TABS
1.0000 | ORAL_TABLET | Freq: Two times a day (BID) | ORAL | Status: DC
  Administered 2017-03-31 – 2017-04-01 (×3): 1 via ORAL
  Filled 2017-03-31 (×3): qty 1

## 2017-03-31 MED ORDER — ZINC GLUCONATE 50 MG PO TABS: 50.0000 mg | ORAL_TABLET | Freq: Every day | ORAL | Status: DC

## 2017-03-31 MED ORDER — CYANOCOBALAMIN 250 MCG PO TABS
250.0000 ug | ORAL_TABLET | Freq: Every day | ORAL | Status: DC
  Administered 2017-03-31 – 2017-04-01 (×2): 250 ug via ORAL
  Filled 2017-03-31 (×2): qty 1

## 2017-03-31 MED ORDER — ADULT MULTIVITAMIN W/MINERALS CH
1.0000 | ORAL_TABLET | Freq: Every day | ORAL | Status: DC
  Administered 2017-03-31 – 2017-04-01 (×2): 1 via ORAL
  Filled 2017-03-31 (×2): qty 1

## 2017-03-31 MED ORDER — QUETIAPINE FUMARATE 25 MG PO TABS
25.0000 mg | ORAL_TABLET | Freq: Two times a day (BID) | ORAL | Status: DC
  Administered 2017-03-31 – 2017-04-01 (×3): 25 mg via ORAL
  Filled 2017-03-31 (×3): qty 1

## 2017-03-31 MED ORDER — SODIUM CHLORIDE 0.9 % IV BOLUS (SEPSIS)
500.0000 mL | Freq: Once | INTRAVENOUS | Status: AC
  Administered 2017-03-31: 500 mL via INTRAVENOUS

## 2017-03-31 NOTE — Progress Notes (Signed)
PROGRESS NOTE  Diane Little YTK:[id number] DOB: 1924-06-12 DOA: 03/27/2017 PCP: Marin Olp, MD  HPI/Recap of past 24 hours:  She is agitated when family is not here Otherwise, no fever, denies pain, bp stable, on room air.    Assessment/Plan: Active Problems:   Total knee replacement status   Protein-calorie malnutrition (HCC)   Alzheimer's dementia without behavioral disturbance   UTI (urinary tract infection)   Sepsis due to possible UTI -Meets sepsis criteria due to fever, tachycardia and a source -ceftriaxone started on admission. patient with hypotension/spike high fever on 11/21, abx broadened to vanc/zosyn, stress dose steroids started on 11/21,  -drastic improvement on 11/22,cxr no acute findings,mrsa screen negative, blood culture no growth, urine culture no growth (urine culture collected after first dose of rocephin) - improving, change abx to augmentin. off steroids,    Hypokalemia: replace k, improving, repeat in am  Alzheimer's dementia /delirium -lives at home with son - she has delirium initially required restrain, prn haldol -improving, start seroquel, encouraged son stay with her today, hopefully able to d/c from the hospital tomorrow  Total knee replacement/weakness in the setting of UTI -PT consult and recommend snf placement, social worker consulted  DVT prophylaxis: Lovenox  Code Status: DNR  Family Communication: sons at bedside    Disposition Plan: SNF once bed available , hopefully on 11/25   Consultants:  none  Procedures:  none  Antibiotics:  Rocephin from admission to 11/21  Vanc from 11/21 to 11/22  zosyn from 11/21 to 11/24  augmentin from 11/24   Objective: BP 111/60 (BP Location: Right Arm)   Pulse 61   Temp 97.9 F (36.6 C) (Oral)   Resp 16   Wt 75.3 kg (166 lb) Comment: Per bed scale  SpO2 98%   BMI 29.41 kg/m   Intake/Output Summary (Last 24 hours) at 03/31/2017 1128 Last data filed at  03/31/2017 1001 Gross per 24 hour  Intake 410 ml  Output -  Net 410 ml   Filed Weights   03/27/17 2239 03/29/17 2340  Weight: 58.9 kg (129 lb 13.6 oz) 75.3 kg (166 lb)    Exam: Patient is examined daily including today on 03/31/2017, exams remain the same as of yesterday except that has changed    General:  NAD,  she is oriented to person, not to time or place  Cardiovascular: tachycardia has resolved, + murmur  Respiratory: CTABL  Abdomen: Soft/ND/NT, positive BS  Musculoskeletal: No Edema  Neuro: alert, oriented to person, moving all extremities  Data Reviewed: Basic Metabolic Panel: Recent Labs  Lab 03/27/17 1340 03/28/17 0353 03/29/17 0352 03/30/17 0641 03/31/17 0340  NA 136 138 140 141 141  K 3.7 3.8 3.3* 3.7 3.5  CL 100* 106 112* 114* 113*  CO2 25 23 22  21* 23  GLUCOSE 134* 98 137* 133* 98  BUN 20 18 16  24* 22*  CREATININE 0.95 0.91 0.80 0.91 0.86  CALCIUM 9.0 8.5* 7.6* 7.7* 8.3*  MG  --   --  1.9  --  2.0   Liver Function Tests: Recent Labs  Lab 03/27/17 1340 03/28/17 0353  AST 23 40  ALT 15 18  ALKPHOS 64 66  BILITOT 0.7 0.9  PROT 7.1 7.2  ALBUMIN 3.5 3.4*   No results for input(s): LIPASE, AMYLASE in the last 168 hours. No results for input(s): AMMONIA in the last 168 hours. CBC: Recent Labs  Lab 03/27/17 1340 03/28/17 0353 03/29/17 0352 03/30/17 0641  WBC 11.0* 13.6* 10.6*  9.9  NEUTROABS 9.1*  --  9.6* 8.6*  HGB 12.0 12.5 10.4* 10.2*  HCT 35.5* 38.0 31.4* 30.7*  MCV 94.7 96.4 94.3 93.6  PLT 185 177 152 182   Cardiac Enzymes:   No results for input(s): CKTOTAL, CKMB, CKMBINDEX, TROPONINI in the last 168 hours. BNP (last 3 results) No results for input(s): BNP in the last 8760 hours.  ProBNP (last 3 results) No results for input(s): PROBNP in the last 8760 hours.  CBG: No results for input(s): GLUCAP in the last 168 hours.  Recent Results (from the past 240 hour(s))  Culture, blood (Routine x 2)     Status: None  (Preliminary result)   Collection Time: 03/27/17  1:40 PM  Result Value Ref Range Status   Specimen Description BLOOD LEFT ANTECUBITAL  Final   Special Requests   Final    BOTTLES DRAWN AEROBIC AND ANAEROBIC Blood Culture adequate volume   Culture   Final    NO GROWTH 3 DAYS Performed at [facility], [address] [place], [place] [address]    Report Status PENDING  Incomplete  Culture, blood (Routine x 2)     Status: None (Preliminary result)   Collection Time: 03/27/17  1:44 PM  Result Value Ref Range Status   Specimen Description BLOOD RIGHT HAND  Final   Special Requests   Final    BOTTLES DRAWN AEROBIC AND ANAEROBIC Blood Culture adequate volume   Culture   Final    NO GROWTH 3 DAYS Performed at [facility], [place] [address] [place], [place] [address]    Report Status PENDING  Incomplete  Culture, Urine     Status: Abnormal   Collection Time: 03/28/17  2:33 PM  Result Value Ref Range Status   Specimen Description URINE, CATHETERIZED  Final   Special Requests NONE  Final   Culture (A)  Final    <10,000 COLONIES/mL INSIGNIFICANT GROWTH Performed at [facility], [place] [address] [place], [place] [address]    Report Status 03/29/2017 FINAL  Final  MRSA PCR Screening     Status: None   Collection Time: 03/28/17  5:33 PM  Result Value Ref Range Status   MRSA by PCR NEGATIVE NEGATIVE Final    Comment:        The GeneXpert MRSA Assay (FDA approved for NASAL specimens only), is one component of a comprehensive MRSA colonization surveillance program. It is not intended to diagnose MRSA infection nor to guide or monitor treatment for MRSA infections.      Studies: No results found.  Scheduled Meds: . amoxicillin-clavulanate  1 tablet Oral Q12H  . aspirin  81 mg Oral Daily  . enoxaparin (LOVENOX) injection  40 mg Subcutaneous QHS  . QUEtiapine  25 mg Oral BID    Continuous Infusions:    Time spent: 25 mins I have personally reviewed and  interpreted on  03/31/2017 daily labs, imagings as discussed above under date review session and assessment and plans.  I reviewed all nursing notes, pharmacy notes,  vitals, pertinent old records  I have discussed plan of care as described above with RN , patient  on 03/31/2017   Florencia Reasons MD, PhD  [facility] Pager [phone]. If 7PM-7AM, please contact night-coverage at [email or web address], password [facility] 03/31/2017, 11:28 AM  LOS: 3 days

## 2017-03-31 NOTE — Progress Notes (Signed)
Charge RN received phone call from tele monitoring staff stating that they have had to reorient pt five times this am and pt is not responding to their attempts to reorient and as such is not a candidate for tele monitoring services.   RN notified [facility] for a sitter to be placed at bedside and tele monitoring to be removed.

## 2017-03-31 NOTE — Progress Notes (Signed)
PHARMACIST - PHYSICIAN ORDER COMMUNICATION  CONCERNING: P&T Medication Policy on Herbal Medications  DESCRIPTION:  This patient's order for:  Zinc Gluconate  has been noted.  This product(s) is classified as an "herbal" or natural product. Due to a lack of definitive safety studies or FDA approval, nonstandard manufacturing practices, plus the potential risk of unknown drug-drug interactions while on inpatient medications, the Pharmacy and Therapeutics Committee does not permit the use of "herbal" or natural products of this type within [facility].   ACTION TAKEN: The pharmacy department is unable to verify this order at this time and your patient has been informed of this safety policy. Please reevaluate patient's clinical condition at discharge and address if the herbal or natural product(s) should be resumed at that time.   Dia Sitter, PharmD, BCPS 03/31/2017 6:11 PM

## 2017-03-31 NOTE — Progress Notes (Signed)
LCSW following for SNF placement.  Patients family has chosen bed at [facility].  Possible DC tomorrow.

## 2017-03-31 NOTE — Progress Notes (Signed)
telesitter and safety sitter discontinued at 11:30 AM.Son at bedside.Bed alarm on.

## 2017-04-01 DIAGNOSIS — N390 Urinary tract infection, site not specified: Secondary | ICD-10-CM | POA: Diagnosis not present

## 2017-04-01 DIAGNOSIS — D509 Iron deficiency anemia, unspecified: Secondary | ICD-10-CM | POA: Diagnosis not present

## 2017-04-01 DIAGNOSIS — A419 Sepsis, unspecified organism: Secondary | ICD-10-CM | POA: Diagnosis not present

## 2017-04-01 DIAGNOSIS — F0150 Vascular dementia without behavioral disturbance: Secondary | ICD-10-CM | POA: Diagnosis not present

## 2017-04-01 DIAGNOSIS — G309 Alzheimer's disease, unspecified: Secondary | ICD-10-CM | POA: Diagnosis not present

## 2017-04-01 DIAGNOSIS — R1312 Dysphagia, oropharyngeal phase: Secondary | ICD-10-CM | POA: Diagnosis not present

## 2017-04-01 DIAGNOSIS — R531 Weakness: Secondary | ICD-10-CM | POA: Diagnosis not present

## 2017-04-01 DIAGNOSIS — E876 Hypokalemia: Secondary | ICD-10-CM | POA: Diagnosis not present

## 2017-04-01 DIAGNOSIS — R278 Other lack of coordination: Secondary | ICD-10-CM | POA: Diagnosis not present

## 2017-04-01 DIAGNOSIS — M6281 Muscle weakness (generalized): Secondary | ICD-10-CM | POA: Diagnosis not present

## 2017-04-01 DIAGNOSIS — E46 Unspecified protein-calorie malnutrition: Secondary | ICD-10-CM | POA: Diagnosis not present

## 2017-04-01 DIAGNOSIS — R2689 Other abnormalities of gait and mobility: Secondary | ICD-10-CM | POA: Diagnosis not present

## 2017-04-01 DIAGNOSIS — R410 Disorientation, unspecified: Secondary | ICD-10-CM | POA: Diagnosis not present

## 2017-04-01 LAB — CULTURE, BLOOD (ROUTINE X 2)
Culture: NO GROWTH
Culture: NO GROWTH
SPECIAL REQUESTS: ADEQUATE
Special Requests: ADEQUATE

## 2017-04-01 LAB — BASIC METABOLIC PANEL
ANION GAP: 8 (ref 5–15)
BUN: 17 mg/dL (ref 6–20)
CO2: 25 mmol/L (ref 22–32)
Calcium: 8.5 mg/dL — ABNORMAL LOW (ref 8.9–10.3)
Chloride: 110 mmol/L (ref 101–111)
Creatinine, Ser: 0.8 mg/dL (ref 0.44–1.00)
GLUCOSE: 99 mg/dL (ref 65–99)
POTASSIUM: 3.3 mmol/L — AB (ref 3.5–5.1)
Sodium: 143 mmol/L (ref 135–145)

## 2017-04-01 LAB — MAGNESIUM: MAGNESIUM: 1.9 mg/dL (ref 1.7–2.4)

## 2017-04-01 MED ORDER — POTASSIUM CHLORIDE CRYS ER 20 MEQ PO TBCR
40.0000 meq | EXTENDED_RELEASE_TABLET | Freq: Once | ORAL | Status: AC
  Administered 2017-04-01: 40 meq via ORAL
  Filled 2017-04-01: qty 2

## 2017-04-01 MED ORDER — POTASSIUM CHLORIDE CRYS ER 20 MEQ PO TBCR: 20.0000 meq | EXTENDED_RELEASE_TABLET | Freq: Every day | ORAL | 0 refills | Status: AC

## 2017-04-01 MED ORDER — QUETIAPINE FUMARATE 25 MG PO TABS: 25.0000 mg | ORAL_TABLET | Freq: Every day | ORAL | 0 refills | Status: AC

## 2017-04-01 MED ORDER — LIP MEDEX EX OINT
TOPICAL_OINTMENT | CUTANEOUS | Status: AC
  Filled 2017-04-01: qty 7

## 2017-04-01 NOTE — Discharge Summary (Signed)
Discharge Summary  Diane Little XAJ:[id number] DOB: [birth year]  PCP: Marin Olp, MD  Admit date: 03/27/2017 Discharge date: 04/01/2017  Time spent: >25mins, more than 50% time spent on coordination of care. Patient is to discharge to SNF  Recommendations for Outpatient Follow-up:   1. F/u with SNF MD for hospital discharge follow up, repeat cbc/bmp at follow up.  Discharge Diagnoses:  Active Hospital Problems   Diagnosis Date Noted  . UTI (urinary tract infection) 03/27/2017  . Alzheimer's dementia without behavioral disturbance 07/13/2014  . Protein-calorie malnutrition ([place]) 09/08/2013  . Total knee replacement status 06/10/2007    Resolved Hospital Problems  No resolved problems to display.    Discharge Condition: stable  Diet recommendation: regular diet  Filed Weights   03/27/17 2239 03/29/17 2340  Weight: 58.9 kg (129 lb 13.6 oz) 75.3 kg (166 lb)    History of present illness:  PCP: Marin Olp, MD  Patient coming from: Home  Chief Complaint: weakness/fever  HPI: Diane Little is a 83 y.o. female with medical history significant of dementia, who is being brought to the hospital by her son due to generalized weakness over the last couple of days, and today patient could not walk so he decided to bring her to the ER.  Patient cannot contribute anything to the story so HPI is per EDP and saw over the phone.  Patient's son states there were no fevers at home, and not really any other significant symptoms as far as the patient has been complaining other than the generalized weakness.  For me patient denies any discomfort.  ED Course: In the ER patient initially had a low-grade temp however became febrile to 102.6, heart rate is in the low 100s, her blood pressure is stable in the 140s.  She is satting 96% on room air.  Blood work reveals a white count of 11 as well as grossly positive UA for an infection.  She was given ceftriaxone and we were  asked to admit.    Hospital Course:  Active Problems:   Total knee replacement status   Protein-calorie malnutrition ([place])   Alzheimer's dementia without behavioral disturbance   UTI (urinary tract infection)   Sepsis due topossible UTI -Meets sepsis criteria due to fever, tachycardiaand asource -ceftriaxone started on admission on 11/20. patient with hypotension/spike high fever on 11/21, abx broadened to vanc/zosyn, stress dose steroids started on 11/21, she received multiple fluids boluses  -drastic improvement on 11/22,cxr no acute findings,mrsa screen negative, blood culture no growth, urine culture no growth (urine culture collected after abx given in the ED) - improving, change abx to augmentin. off steroids,  -she continue to improve, no fever, leukocytosis and lactic acidosis resolved. She finished total of 5 days abx treatment in the hospital for presumed UTI.   Hypokalemia: replace k. She is discharge on three more days of potassium supplement, repeat bmp at hospital discharge follow up.  Alzheimer's dementia /delirium -lives at home with son prior to this hospitalization -she has delirium initially required restrain, prn haldol -improving, started seroquel this hospitalization, she is discharged on seroquel qhs for additional 10 day,  Patient may not need long term seroquel once recover from acute illness, to be determined at hospital discharge follow up.  Total knee replacement/weakness in the setting of UTI -PT consult and recommend snf placement  DVT prophylaxis while in the hospital:Lovenox Code Status:DNR Family Communication:sons at bedside   Disposition Plan: SNF on 11/25   Consultants:  none  Procedures:  none  Antibiotics:  Rocephin from admission to 11/21  Vanc from 11/21 to 11/22  zosyn from 11/21 to 11/24  augmentin from 11/24   Discharge Exam: BP 129/72 (BP Location: Right Arm)   Pulse 71   Temp 98.3 F (36.8  C) (Oral)   Resp 18   Wt 75.3 kg (166 lb) Comment: Per bed scale  SpO2 96%   BMI 29.41 kg/m    General:  NAD,  she is oriented to person, not to time or place  Cardiovascular: tachycardia has resolved, + murmur  Respiratory: CTABL  Abdomen: Soft/ND/NT, positive BS  Musculoskeletal: No Edema  Neuro: alert, oriented to person, moving all extremities   Discharge Instructions You were cared for by a hospitalist during your hospital stay. If you have any questions about your discharge medications or the care you received while you were in the hospital after you are discharged, you can call the unit and asked to speak with the hospitalist on call if the hospitalist that took care of you is not available. Once you are discharged, your primary care physician will handle any further medical issues. Please note that NO REFILLS for any discharge medications will be authorized once you are discharged, as it is imperative that you return to your primary care physician (or establish a relationship with a primary care physician if you do not have one) for your aftercare needs so that they can reassess your need for medications and monitor your lab values.  Discharge Instructions    Diet general   Complete by:  As directed    Increase activity slowly   Complete by:  As directed      Allergies as of 04/01/2017      Reactions   Fish Allergy Rash   ALL SEAFOOD   Pneumococcal Vaccine Polyvalent    Ramipril    REACTION: rash      Medication List    STOP taking these medications   fluticasone 50 MCG/ACT nasal spray Commonly known as:  FLONASE     TAKE these medications   ASPIRIN PO Take 1 tablet by mouth.   calcium-vitamin D 500-200 MG-UNIT tablet Commonly known as:  OSCAL WITH D Take 1 tablet by mouth daily with breakfast.   ferrous sulfate 325 (65 FE) MG tablet Take 1 tablet (325 mg total) by mouth daily with breakfast.   multivitamin with minerals Tabs tablet Take 1 tablet by  mouth daily.   potassium chloride SA 20 MEQ tablet Commonly known as:  K-DUR,KLOR-CON Take 1 tablet (20 mEq total) by mouth daily for 3 doses.   pyridOXINE 50 MG tablet Commonly known as:  B-6 Take 50 mg by mouth daily.   QUEtiapine 25 MG tablet Commonly known as:  SEROQUEL Take 1 tablet (25 mg total) by mouth at bedtime.   vitamin B-12 250 MCG tablet Commonly known as:  CYANOCOBALAMIN Take 250 mcg by mouth daily.   zinc gluconate 50 MG tablet Take 50 mg by mouth daily.      Allergies  Allergen Reactions  . Fish Allergy Rash    ALL SEAFOOD  . Pneumococcal Vaccine Polyvalent   . Ramipril     REACTION: rash    Contact information for follow-up providers    Marin Olp, MD Follow up in 2 week(s).   Specialty:  Family Medicine Contact information: [address] [place] [address] [phone]            Contact information for after-discharge  care    [facility] Follow up.   Service:  Skilled Nursing Contact information: [place] [phone]                   The results of significant diagnostics from this hospitalization (including imaging, microbiology, ancillary and laboratory) are listed below for reference.    Significant Diagnostic Studies: Dg Chest Port 1 View  Result Date: 03/27/2017 CLINICAL DATA:  Weakness. EXAM: PORTABLE CHEST 1 VIEW COMPARISON:  06/26/2012 FINDINGS: Borderline heart size, distorted by scoliosis. Negative mediastinal contours. There is no edema, consolidation, effusion, or pneumothorax. Osteopenia. No acute osseous finding. IMPRESSION: No evidence of active disease. Electronically Signed   By: Monte Fantasia M.D.   On: 03/27/2017 15:20    Microbiology: Recent Results (from the past 240 hour(s))  Culture, blood (Routine x 2)     Status: None (Preliminary result)   Collection Time: 03/27/17  1:40 PM  Result Value Ref  Range Status   Specimen Description BLOOD LEFT ANTECUBITAL  Final   Special Requests   Final    BOTTLES DRAWN AEROBIC AND ANAEROBIC Blood Culture adequate volume   Culture   Final    NO GROWTH 4 DAYS Performed at [facility], [address], [place], [place] [address]    Report Status PENDING  Incomplete  Culture, blood (Routine x 2)     Status: None (Preliminary result)   Collection Time: 03/27/17  1:44 PM  Result Value Ref Range Status   Specimen Description BLOOD RIGHT HAND  Final   Special Requests   Final    BOTTLES DRAWN AEROBIC AND ANAEROBIC Blood Culture adequate volume   Culture   Final    NO GROWTH 4 DAYS Performed at [facility], [place] [address], [place], [place] [address]    Report Status PENDING  Incomplete  Culture, Urine     Status: Abnormal   Collection Time: 03/28/17  2:33 PM  Result Value Ref Range Status   Specimen Description URINE, CATHETERIZED  Final   Special Requests NONE  Final   Culture (A)  Final    <10,000 COLONIES/mL INSIGNIFICANT GROWTH Performed at [facility], [place] [address] [place], [place] [address]    Report Status 03/29/2017 FINAL  Final  MRSA PCR Screening     Status: None   Collection Time: 03/28/17  5:33 PM  Result Value Ref Range Status   MRSA by PCR NEGATIVE NEGATIVE Final    Comment:        The GeneXpert MRSA Assay (FDA approved for NASAL specimens only), is one component of a comprehensive MRSA colonization surveillance program. It is not intended to diagnose MRSA infection nor to guide or monitor treatment for MRSA infections.      Labs: Basic Metabolic Panel: Recent Labs  Lab 03/28/17 0353 03/29/17 0352 03/30/17 0641 03/31/17 0340 04/01/17 0330  NA 138 140 141 141 143  K 3.8 3.3* 3.7 3.5 3.3*  CL 106 112* 114* 113* 110  CO2 23 22 21* 23 25  GLUCOSE 98 137* 133* 98 99  BUN 18 16 24* 22* 17  CREATININE 0.91 0.80 0.91 0.86 0.80  CALCIUM 8.5* 7.6* 7.7* 8.3* 8.5*  MG  --  1.9  --  2.0 1.9    Liver Function Tests: Recent Labs  Lab 03/27/17 1340 03/28/17 0353  AST 23 40  ALT 15 18  ALKPHOS 64 66  BILITOT  0.7 0.9  PROT 7.1 7.2  ALBUMIN 3.5 3.4*   No results for input(s): LIPASE, AMYLASE in the last 168 hours. No results for input(s): AMMONIA in the last 168 hours. CBC: Recent Labs  Lab 03/27/17 1340 03/28/17 0353 03/29/17 0352 03/30/17 0641  WBC 11.0* 13.6* 10.6* 9.9  NEUTROABS 9.1*  --  9.6* 8.6*  HGB 12.0 12.5 10.4* 10.2*  HCT 35.5* 38.0 31.4* 30.7*  MCV 94.7 96.4 94.3 93.6  PLT 185 177 152 182   Cardiac Enzymes: No results for input(s): CKTOTAL, CKMB, CKMBINDEX, TROPONINI in the last 168 hours. BNP: BNP (last 3 results) No results for input(s): BNP in the last 8760 hours.  ProBNP (last 3 results) No results for input(s): PROBNP in the last 8760 hours.  CBG: No results for input(s): GLUCAP in the last 168 hours.     Signed:  Florencia Reasons MD, PhD  [facility] 04/01/2017, 9:31 AM

## 2017-04-01 NOTE — Progress Notes (Signed)
Discharge report called to [facility] to [place]. Two son's in pt's room waiting for [facility] to pick pt up and transport to [facility].Pt denies any pain. Pt had trouble swallowing pills, had to melt some in water. Information packet sent with pt.

## 2017-04-01 NOTE — Progress Notes (Addendum)
Pt discharged with plan to admit to [facility]. CSW left voicemail with admissions to confirm facility prepared for pt's arrival today. Upon return call will arrange transportation.  Sharren Bridge, MSW, LCSW Clinical Social Work 04/01/2017 [phone] Weekend coverage  12:20-Received call back from [facility]- pt will admit to room 3251, report [phone]- family completed admission paperwork at facility today. Son at bedside currently aware of plan as well. CSW arranged transportation (2pm)

## 2017-04-01 NOTE — Care Management Important Message (Signed)
Important Message  Patient Details  Name: Diane Little MRN: [id number] Date of Birth: 04/09/1925   Medicare Important Message Given:  Yes    Erenest Rasher, RN 04/01/2017, 1:47 PM

## 2017-04-03 DIAGNOSIS — E46 Unspecified protein-calorie malnutrition: Secondary | ICD-10-CM | POA: Diagnosis not present

## 2017-04-03 DIAGNOSIS — N390 Urinary tract infection, site not specified: Secondary | ICD-10-CM | POA: Diagnosis not present

## 2017-04-03 DIAGNOSIS — G309 Alzheimer's disease, unspecified: Secondary | ICD-10-CM | POA: Diagnosis not present

## 2017-04-03 DIAGNOSIS — R531 Weakness: Secondary | ICD-10-CM | POA: Diagnosis not present

## 2017-04-04 DIAGNOSIS — E46 Unspecified protein-calorie malnutrition: Secondary | ICD-10-CM | POA: Diagnosis not present

## 2017-04-04 DIAGNOSIS — N390 Urinary tract infection, site not specified: Secondary | ICD-10-CM | POA: Diagnosis not present

## 2017-04-05 DIAGNOSIS — E46 Unspecified protein-calorie malnutrition: Secondary | ICD-10-CM | POA: Diagnosis not present

## 2017-04-05 DIAGNOSIS — N390 Urinary tract infection, site not specified: Secondary | ICD-10-CM | POA: Diagnosis not present

## 2017-04-05 DIAGNOSIS — G309 Alzheimer's disease, unspecified: Secondary | ICD-10-CM | POA: Diagnosis not present

## 2017-04-10 DIAGNOSIS — R531 Weakness: Secondary | ICD-10-CM | POA: Diagnosis not present

## 2017-04-10 DIAGNOSIS — E46 Unspecified protein-calorie malnutrition: Secondary | ICD-10-CM | POA: Diagnosis not present

## 2017-04-10 DIAGNOSIS — G309 Alzheimer's disease, unspecified: Secondary | ICD-10-CM | POA: Diagnosis not present

## 2017-04-10 DIAGNOSIS — N390 Urinary tract infection, site not specified: Secondary | ICD-10-CM | POA: Diagnosis not present

## 2017-04-14 ENCOUNTER — Encounter: Payer: Self-pay | Admitting: Family Medicine

## 2017-04-16 ENCOUNTER — Inpatient Hospital Stay: Payer: Medicare Other | Admitting: Family Medicine

## 2017-04-17 DIAGNOSIS — E46 Unspecified protein-calorie malnutrition: Secondary | ICD-10-CM | POA: Diagnosis not present

## 2017-04-17 DIAGNOSIS — R531 Weakness: Secondary | ICD-10-CM | POA: Diagnosis not present

## 2017-04-17 DIAGNOSIS — G309 Alzheimer's disease, unspecified: Secondary | ICD-10-CM | POA: Diagnosis not present

## 2017-04-18 DIAGNOSIS — R531 Weakness: Secondary | ICD-10-CM | POA: Diagnosis not present

## 2017-04-18 DIAGNOSIS — E46 Unspecified protein-calorie malnutrition: Secondary | ICD-10-CM | POA: Diagnosis not present

## 2017-04-18 DIAGNOSIS — G309 Alzheimer's disease, unspecified: Secondary | ICD-10-CM | POA: Diagnosis not present

## 2017-04-19 ENCOUNTER — Other Ambulatory Visit: Payer: Self-pay | Admitting: *Deleted

## 2017-04-19 NOTE — Patient Outreach (Signed)
[facility]) Care Management  04/23/2017  Diane Little May 04, 1925 [id number]  Per Vickii Chafe, SW at facility.  She reports patient plans to be LTC at facility. No plans to discharge home. Plan to sign off as no [facility] care management needs at this time.   Royetta Crochet. Laymond Purser, RN, BSN, [place] [phone]) Business Cell  [phone]) Toll Free Office

## 2017-05-08 DEATH — deceased

## 2017-06-13 ENCOUNTER — Ambulatory Visit: Payer: Medicare Other | Admitting: Podiatry

## 2017-09-05 ENCOUNTER — Ambulatory Visit: Payer: Medicare Other | Admitting: Family Medicine

## 2018-06-27 IMAGING — DX DG CHEST 1V PORT
1 series · 1 of 1 positions shown · non-contrast
Comparison: 06/26/2012

CLINICAL DATA: Weakness.

EXAM:
PORTABLE CHEST 1 VIEW

[chest ap]
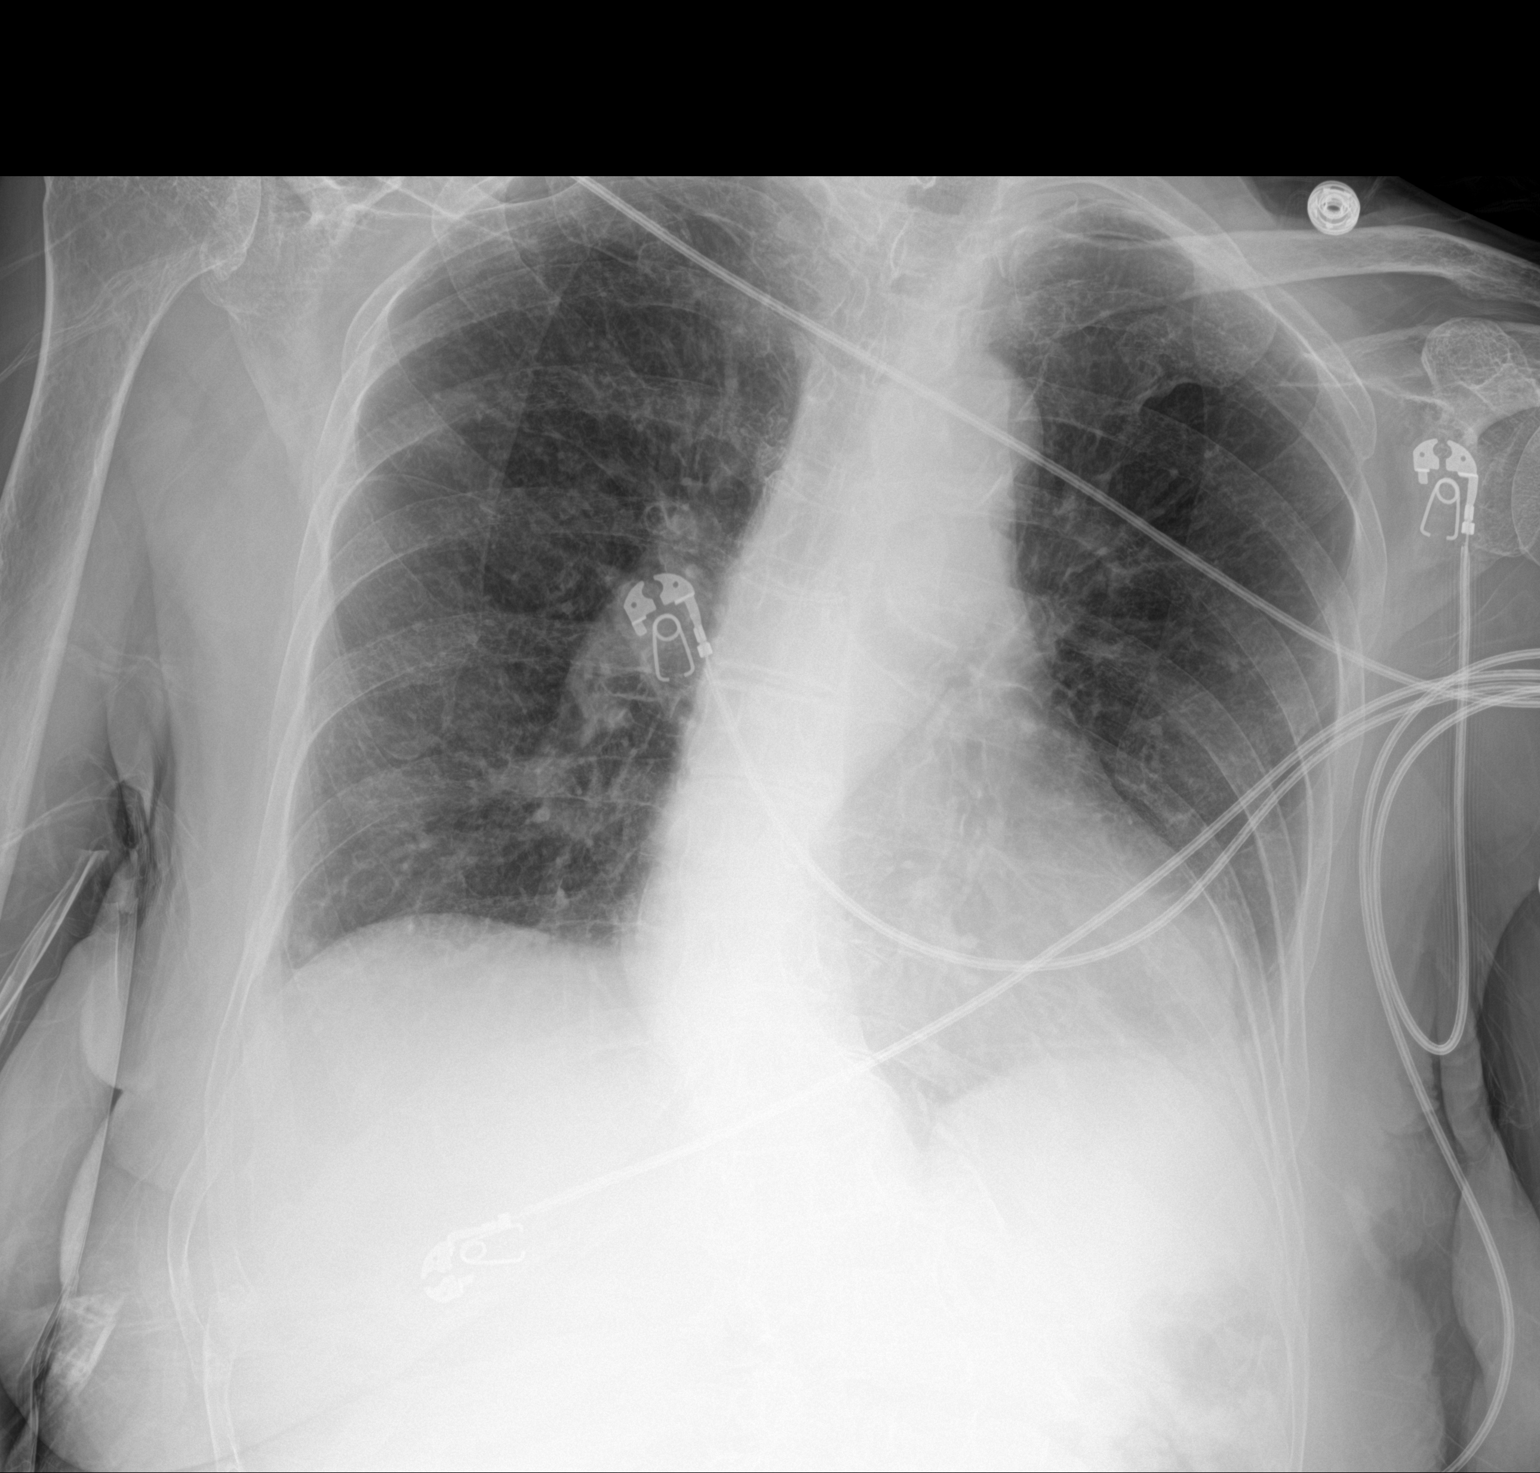

[1 of 1 positions shown; findings below may reference images not displayed]

FINDINGS: Borderline heart size, distorted by scoliosis. Negative mediastinal
contours. There is no edema, consolidation, effusion, or
pneumothorax. Osteopenia. No acute osseous finding.
IMPRESSION: No evidence of active disease.
